# Patient Record
Sex: Female | Born: 1955
Health system: Southern US, Community
[De-identification: ages and names within clinical notes are randomized; demographics above are authoritative.]

## PROBLEM LIST (undated history)

## (undated) DIAGNOSIS — C50919 Malignant neoplasm of unspecified site of unspecified female breast: Secondary | ICD-10-CM

## (undated) DIAGNOSIS — Z9889 Other specified postprocedural states: Secondary | ICD-10-CM

## (undated) DIAGNOSIS — M25569 Pain in unspecified knee: Secondary | ICD-10-CM

## (undated) DIAGNOSIS — I219 Acute myocardial infarction, unspecified: Secondary | ICD-10-CM

## (undated) DIAGNOSIS — I2699 Other pulmonary embolism without acute cor pulmonale: Secondary | ICD-10-CM

## (undated) DIAGNOSIS — K219 Gastro-esophageal reflux disease without esophagitis: Secondary | ICD-10-CM

## (undated) DIAGNOSIS — N200 Calculus of kidney: Secondary | ICD-10-CM

## (undated) DIAGNOSIS — E119 Type 2 diabetes mellitus without complications: Secondary | ICD-10-CM

## (undated) DIAGNOSIS — I10 Essential (primary) hypertension: Secondary | ICD-10-CM

## (undated) DIAGNOSIS — I517 Cardiomegaly: Secondary | ICD-10-CM

## (undated) DIAGNOSIS — T8859XA Other complications of anesthesia, initial encounter: Secondary | ICD-10-CM

## (undated) DIAGNOSIS — I2510 Atherosclerotic heart disease of native coronary artery without angina pectoris: Secondary | ICD-10-CM

## (undated) DIAGNOSIS — T4145XA Adverse effect of unspecified anesthetic, initial encounter: Secondary | ICD-10-CM

## (undated) DIAGNOSIS — M7050 Other bursitis of knee, unspecified knee: Secondary | ICD-10-CM

## (undated) DIAGNOSIS — R112 Nausea with vomiting, unspecified: Secondary | ICD-10-CM

## (undated) DIAGNOSIS — R609 Edema, unspecified: Secondary | ICD-10-CM

## (undated) DIAGNOSIS — E785 Hyperlipidemia, unspecified: Secondary | ICD-10-CM

## (undated) HISTORY — DX: Acute myocardial infarction, unspecified: I21.9

## (undated) HISTORY — PX: CHOLECYSTECTOMY: SHX55

## (undated) HISTORY — PX: CARDIAC CATHETERIZATION: SHX172

## (undated) HISTORY — DX: Other bursitis of knee, unspecified knee: M70.50

## (undated) HISTORY — DX: Type 2 diabetes mellitus without complications: E11.9

## (undated) HISTORY — PX: TONSILECTOMY, ADENOIDECTOMY, BILATERAL MYRINGOTOMY AND TUBES: SHX2538

## (undated) HISTORY — DX: Malignant neoplasm of unspecified site of unspecified female breast: C50.919

## (undated) HISTORY — PX: TUBAL LIGATION: SHX77

## (undated) HISTORY — PX: LYMPHADENECTOMY: SHX15

## (undated) HISTORY — PX: TONSILLECTOMY: SUR1361

## (undated) HISTORY — DX: Gastro-esophageal reflux disease without esophagitis: K21.9

## (undated) HISTORY — DX: Cardiomegaly: I51.7

## (undated) HISTORY — DX: Pain in unspecified knee: M25.569

## (undated) HISTORY — DX: Atherosclerotic heart disease of native coronary artery without angina pectoris: I25.10

## (undated) HISTORY — DX: Other pulmonary embolism without acute cor pulmonale: I26.99

## (undated) HISTORY — PX: CYSTECTOMY: SUR359

## (undated) HISTORY — DX: Calculus of kidney: N20.0

## (undated) HISTORY — PX: OTHER SURGICAL HISTORY: SHX169

## (undated) HISTORY — DX: Essential (primary) hypertension: I10

## (undated) HISTORY — DX: Hyperlipidemia, unspecified: E78.5

## (undated) HISTORY — PX: BREAST LUMPECTOMY: SHX2

## (undated) HISTORY — PX: APPENDECTOMY: SHX54

## (undated) HISTORY — DX: Edema, unspecified: R60.9

---

## 2002-04-08 ENCOUNTER — Emergency Department (HOSPITAL_COMMUNITY): Admission: EM | Admit: 2002-04-08 | Discharge: 2002-04-08 | Payer: Self-pay | Admitting: *Deleted

## 2002-04-08 ENCOUNTER — Encounter: Payer: Self-pay | Admitting: Emergency Medicine

## 2002-09-23 ENCOUNTER — Encounter: Payer: Self-pay | Admitting: *Deleted

## 2002-09-23 ENCOUNTER — Encounter (HOSPITAL_COMMUNITY): Admission: RE | Admit: 2002-09-23 | Discharge: 2002-10-23 | Payer: Self-pay | Admitting: *Deleted

## 2003-05-10 DIAGNOSIS — I219 Acute myocardial infarction, unspecified: Secondary | ICD-10-CM

## 2003-05-10 HISTORY — DX: Acute myocardial infarction, unspecified: I21.9

## 2003-05-10 HISTORY — PX: CORONARY ANGIOPLASTY: SHX604

## 2004-05-09 DIAGNOSIS — I2510 Atherosclerotic heart disease of native coronary artery without angina pectoris: Secondary | ICD-10-CM

## 2004-05-09 HISTORY — DX: Atherosclerotic heart disease of native coronary artery without angina pectoris: I25.10

## 2004-09-13 ENCOUNTER — Ambulatory Visit: Payer: Self-pay | Admitting: *Deleted

## 2004-09-17 ENCOUNTER — Ambulatory Visit: Payer: Self-pay | Admitting: *Deleted

## 2004-09-17 ENCOUNTER — Encounter (HOSPITAL_COMMUNITY): Admission: RE | Admit: 2004-09-17 | Discharge: 2004-10-17 | Payer: Self-pay | Admitting: *Deleted

## 2004-09-24 ENCOUNTER — Ambulatory Visit (HOSPITAL_COMMUNITY): Admission: RE | Admit: 2004-09-24 | Discharge: 2004-09-24 | Payer: Self-pay | Admitting: *Deleted

## 2004-09-24 ENCOUNTER — Ambulatory Visit: Payer: Self-pay | Admitting: *Deleted

## 2004-10-01 ENCOUNTER — Inpatient Hospital Stay (HOSPITAL_BASED_OUTPATIENT_CLINIC_OR_DEPARTMENT_OTHER): Admission: RE | Admit: 2004-10-01 | Discharge: 2004-10-01 | Payer: Self-pay | Admitting: Cardiovascular Disease

## 2004-10-01 ENCOUNTER — Ambulatory Visit: Payer: Self-pay | Admitting: Cardiovascular Disease

## 2004-10-08 ENCOUNTER — Ambulatory Visit (HOSPITAL_COMMUNITY): Admission: RE | Admit: 2004-10-08 | Discharge: 2004-10-09 | Payer: Self-pay | Admitting: Cardiology

## 2004-10-22 ENCOUNTER — Ambulatory Visit: Payer: Self-pay | Admitting: *Deleted

## 2005-02-10 ENCOUNTER — Ambulatory Visit: Payer: Self-pay | Admitting: *Deleted

## 2005-03-17 ENCOUNTER — Ambulatory Visit: Payer: Self-pay | Admitting: Orthopedic Surgery

## 2005-05-08 ENCOUNTER — Emergency Department (HOSPITAL_COMMUNITY): Admission: EM | Admit: 2005-05-08 | Discharge: 2005-05-08 | Payer: Self-pay | Admitting: Emergency Medicine

## 2005-08-22 ENCOUNTER — Emergency Department (HOSPITAL_COMMUNITY): Admission: EM | Admit: 2005-08-22 | Discharge: 2005-08-22 | Payer: Self-pay | Admitting: Emergency Medicine

## 2005-09-01 ENCOUNTER — Ambulatory Visit: Payer: Self-pay | Admitting: Orthopedic Surgery

## 2005-09-16 ENCOUNTER — Ambulatory Visit: Payer: Self-pay | Admitting: *Deleted

## 2005-09-23 ENCOUNTER — Ambulatory Visit (HOSPITAL_COMMUNITY): Admission: RE | Admit: 2005-09-23 | Discharge: 2005-09-23 | Payer: Self-pay | Admitting: *Deleted

## 2005-09-23 ENCOUNTER — Ambulatory Visit: Payer: Self-pay | Admitting: *Deleted

## 2005-10-07 ENCOUNTER — Ambulatory Visit: Payer: Self-pay | Admitting: *Deleted

## 2005-10-10 ENCOUNTER — Ambulatory Visit: Payer: Self-pay | Admitting: Orthopedic Surgery

## 2005-11-29 ENCOUNTER — Ambulatory Visit (HOSPITAL_COMMUNITY): Admission: RE | Admit: 2005-11-29 | Discharge: 2005-11-29 | Payer: Self-pay | Admitting: Pulmonary Disease

## 2005-12-04 ENCOUNTER — Emergency Department (HOSPITAL_COMMUNITY): Admission: EM | Admit: 2005-12-04 | Discharge: 2005-12-04 | Payer: Self-pay | Admitting: Emergency Medicine

## 2005-12-09 ENCOUNTER — Encounter: Admission: RE | Admit: 2005-12-09 | Discharge: 2005-12-09 | Payer: Self-pay | Admitting: Pulmonary Disease

## 2005-12-09 ENCOUNTER — Encounter (INDEPENDENT_AMBULATORY_CARE_PROVIDER_SITE_OTHER): Payer: Self-pay | Admitting: Diagnostic Radiology

## 2005-12-09 ENCOUNTER — Encounter (INDEPENDENT_AMBULATORY_CARE_PROVIDER_SITE_OTHER): Payer: Self-pay | Admitting: *Deleted

## 2005-12-19 ENCOUNTER — Ambulatory Visit: Payer: Self-pay | Admitting: Cardiovascular Disease

## 2005-12-20 ENCOUNTER — Encounter: Admission: RE | Admit: 2005-12-20 | Discharge: 2005-12-20 | Payer: Self-pay | Admitting: Pulmonary Disease

## 2005-12-21 ENCOUNTER — Ambulatory Visit: Payer: Self-pay

## 2006-01-18 ENCOUNTER — Ambulatory Visit (HOSPITAL_BASED_OUTPATIENT_CLINIC_OR_DEPARTMENT_OTHER): Admission: RE | Admit: 2006-01-18 | Discharge: 2006-01-19 | Payer: Self-pay | Admitting: Surgery

## 2006-01-18 ENCOUNTER — Encounter (INDEPENDENT_AMBULATORY_CARE_PROVIDER_SITE_OTHER): Payer: Self-pay | Admitting: *Deleted

## 2006-02-13 ENCOUNTER — Ambulatory Visit (HOSPITAL_BASED_OUTPATIENT_CLINIC_OR_DEPARTMENT_OTHER): Admission: RE | Admit: 2006-02-13 | Discharge: 2006-02-13 | Payer: Self-pay | Admitting: Surgery

## 2006-02-20 ENCOUNTER — Ambulatory Visit (HOSPITAL_COMMUNITY): Payer: Self-pay | Admitting: Oncology

## 2006-02-20 ENCOUNTER — Encounter: Admission: RE | Admit: 2006-02-20 | Discharge: 2006-02-20 | Payer: Self-pay | Admitting: Oncology

## 2006-02-20 ENCOUNTER — Encounter (HOSPITAL_COMMUNITY): Admission: RE | Admit: 2006-02-20 | Discharge: 2006-03-22 | Payer: Self-pay | Admitting: Oncology

## 2006-02-27 ENCOUNTER — Ambulatory Visit (HOSPITAL_COMMUNITY): Admission: RE | Admit: 2006-02-27 | Discharge: 2006-02-27 | Payer: Self-pay | Admitting: Oncology

## 2006-03-01 ENCOUNTER — Ambulatory Visit (HOSPITAL_COMMUNITY): Admission: RE | Admit: 2006-03-01 | Discharge: 2006-03-02 | Payer: Self-pay | Admitting: Surgery

## 2006-03-01 ENCOUNTER — Encounter (INDEPENDENT_AMBULATORY_CARE_PROVIDER_SITE_OTHER): Payer: Self-pay | Admitting: Specialist

## 2006-03-09 ENCOUNTER — Inpatient Hospital Stay (HOSPITAL_COMMUNITY): Admission: RE | Admit: 2006-03-09 | Discharge: 2006-03-14 | Payer: Self-pay | Admitting: Pulmonary Disease

## 2006-03-10 ENCOUNTER — Ambulatory Visit: Payer: Self-pay | Admitting: Oncology

## 2006-03-28 ENCOUNTER — Encounter (HOSPITAL_COMMUNITY): Admission: RE | Admit: 2006-03-28 | Discharge: 2006-04-27 | Payer: Self-pay | Admitting: Oncology

## 2006-04-24 ENCOUNTER — Ambulatory Visit (HOSPITAL_COMMUNITY): Payer: Self-pay | Admitting: Oncology

## 2006-05-12 ENCOUNTER — Ambulatory Visit: Payer: Self-pay | Admitting: Cardiovascular Disease

## 2006-05-15 ENCOUNTER — Encounter (HOSPITAL_COMMUNITY): Admission: RE | Admit: 2006-05-15 | Discharge: 2006-06-14 | Payer: Self-pay | Admitting: Oncology

## 2006-06-16 ENCOUNTER — Ambulatory Visit: Admission: RE | Admit: 2006-06-16 | Discharge: 2006-08-30 | Payer: Self-pay | Admitting: *Deleted

## 2006-06-26 ENCOUNTER — Encounter (HOSPITAL_COMMUNITY): Admission: RE | Admit: 2006-06-26 | Discharge: 2006-07-26 | Payer: Self-pay | Admitting: Oncology

## 2006-06-26 ENCOUNTER — Ambulatory Visit (HOSPITAL_COMMUNITY): Payer: Self-pay | Admitting: Oncology

## 2006-07-21 ENCOUNTER — Ambulatory Visit (HOSPITAL_BASED_OUTPATIENT_CLINIC_OR_DEPARTMENT_OTHER): Admission: RE | Admit: 2006-07-21 | Discharge: 2006-07-21 | Payer: Self-pay | Admitting: Surgery

## 2006-10-15 ENCOUNTER — Emergency Department (HOSPITAL_COMMUNITY): Admission: EM | Admit: 2006-10-15 | Discharge: 2006-10-15 | Payer: Self-pay | Admitting: Emergency Medicine

## 2006-11-01 ENCOUNTER — Encounter (HOSPITAL_COMMUNITY): Admission: RE | Admit: 2006-11-01 | Discharge: 2006-12-01 | Payer: Self-pay | Admitting: Oncology

## 2006-11-01 ENCOUNTER — Ambulatory Visit (HOSPITAL_COMMUNITY): Payer: Self-pay | Admitting: Oncology

## 2006-12-12 ENCOUNTER — Encounter: Admission: RE | Admit: 2006-12-12 | Discharge: 2006-12-12 | Payer: Self-pay | Admitting: Surgery

## 2007-04-23 ENCOUNTER — Ambulatory Visit: Payer: Self-pay | Admitting: Oncology

## 2007-04-24 ENCOUNTER — Ambulatory Visit (HOSPITAL_COMMUNITY): Payer: Self-pay | Admitting: Oncology
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
## 2007-09-18 ENCOUNTER — Ambulatory Visit: Payer: Self-pay | Admitting: Cardiovascular Disease

## 2007-10-29 ENCOUNTER — Ambulatory Visit (HOSPITAL_COMMUNITY): Payer: Self-pay | Admitting: Oncology

## 2007-12-13 ENCOUNTER — Ambulatory Visit: Payer: Self-pay

## 2007-12-13 ENCOUNTER — Encounter: Payer: Self-pay | Admitting: Cardiology

## 2007-12-13 ENCOUNTER — Encounter: Payer: Self-pay | Admitting: Cardiovascular Disease

## 2007-12-14 ENCOUNTER — Ambulatory Visit: Payer: Self-pay

## 2007-12-17 ENCOUNTER — Encounter: Admission: RE | Admit: 2007-12-17 | Discharge: 2007-12-17 | Payer: Self-pay | Admitting: Pulmonary Disease

## 2008-02-11 ENCOUNTER — Ambulatory Visit: Payer: Self-pay | Admitting: Orthopedic Surgery

## 2008-02-11 DIAGNOSIS — M25569 Pain in unspecified knee: Secondary | ICD-10-CM

## 2008-02-11 DIAGNOSIS — IMO0002 Reserved for concepts with insufficient information to code with codable children: Secondary | ICD-10-CM

## 2008-04-09 ENCOUNTER — Ambulatory Visit: Payer: Self-pay | Admitting: Gastroenterology

## 2008-05-05 ENCOUNTER — Ambulatory Visit (HOSPITAL_COMMUNITY): Payer: Self-pay | Admitting: Oncology

## 2008-06-02 ENCOUNTER — Encounter: Payer: Self-pay | Admitting: Gastroenterology

## 2008-06-02 ENCOUNTER — Ambulatory Visit (HOSPITAL_COMMUNITY): Admission: RE | Admit: 2008-06-02 | Discharge: 2008-06-02 | Payer: Self-pay | Admitting: Gastroenterology

## 2008-06-02 ENCOUNTER — Ambulatory Visit: Payer: Self-pay | Admitting: Gastroenterology

## 2008-06-25 ENCOUNTER — Encounter: Admission: RE | Admit: 2008-06-25 | Discharge: 2008-06-25 | Payer: Self-pay | Admitting: Pulmonary Disease

## 2008-10-27 ENCOUNTER — Ambulatory Visit (HOSPITAL_COMMUNITY): Payer: Self-pay | Admitting: Oncology

## 2008-12-02 ENCOUNTER — Ambulatory Visit: Payer: Self-pay | Admitting: Orthopedic Surgery

## 2008-12-26 ENCOUNTER — Encounter: Admission: RE | Admit: 2008-12-26 | Discharge: 2008-12-26 | Payer: Self-pay | Admitting: Pulmonary Disease

## 2009-03-13 ENCOUNTER — Telehealth (INDEPENDENT_AMBULATORY_CARE_PROVIDER_SITE_OTHER): Payer: Self-pay | Admitting: *Deleted

## 2009-03-26 ENCOUNTER — Telehealth: Payer: Self-pay | Admitting: Cardiovascular Disease

## 2009-06-02 ENCOUNTER — Ambulatory Visit (HOSPITAL_COMMUNITY): Payer: Self-pay | Admitting: Oncology

## 2009-06-12 ENCOUNTER — Ambulatory Visit (HOSPITAL_COMMUNITY)
Admission: RE | Admit: 2009-06-12 | Discharge: 2009-06-12 | Payer: Self-pay | Source: Home / Self Care | Admitting: Oncology

## 2009-06-25 ENCOUNTER — Telehealth: Payer: Self-pay | Admitting: Cardiovascular Disease

## 2009-11-16 ENCOUNTER — Telehealth: Payer: Self-pay | Admitting: Cardiovascular Disease

## 2009-11-27 ENCOUNTER — Ambulatory Visit (HOSPITAL_COMMUNITY): Payer: Self-pay | Admitting: Oncology

## 2009-12-18 DIAGNOSIS — I2510 Atherosclerotic heart disease of native coronary artery without angina pectoris: Secondary | ICD-10-CM

## 2009-12-18 DIAGNOSIS — E785 Hyperlipidemia, unspecified: Secondary | ICD-10-CM

## 2009-12-18 DIAGNOSIS — R079 Chest pain, unspecified: Secondary | ICD-10-CM

## 2009-12-18 DIAGNOSIS — K219 Gastro-esophageal reflux disease without esophagitis: Secondary | ICD-10-CM | POA: Insufficient documentation

## 2009-12-18 DIAGNOSIS — Z8679 Personal history of other diseases of the circulatory system: Secondary | ICD-10-CM

## 2009-12-18 DIAGNOSIS — R609 Edema, unspecified: Secondary | ICD-10-CM

## 2009-12-18 DIAGNOSIS — C50919 Malignant neoplasm of unspecified site of unspecified female breast: Secondary | ICD-10-CM | POA: Insufficient documentation

## 2009-12-28 ENCOUNTER — Ambulatory Visit: Payer: Self-pay | Admitting: Cardiovascular Disease

## 2009-12-30 ENCOUNTER — Encounter: Admission: RE | Admit: 2009-12-30 | Discharge: 2009-12-30 | Payer: Self-pay | Admitting: Surgery

## 2010-05-28 ENCOUNTER — Ambulatory Visit (HOSPITAL_COMMUNITY)
Admission: RE | Admit: 2010-05-28 | Discharge: 2010-06-08 | Payer: Self-pay | Source: Home / Self Care | Attending: Oncology | Admitting: Oncology

## 2010-05-28 ENCOUNTER — Encounter (HOSPITAL_COMMUNITY)
Admission: RE | Admit: 2010-05-28 | Discharge: 2010-06-08 | Payer: Self-pay | Source: Home / Self Care | Attending: Oncology | Admitting: Oncology

## 2010-05-30 ENCOUNTER — Encounter: Payer: Self-pay | Admitting: *Deleted

## 2010-06-10 NOTE — Progress Notes (Signed)
Summary: refill request   Phone Note Refill Request   Refills Requested: Medication #1:  PLAVIX 75 MG TABS Take one tablet by mouth daily. pt wants to know if med comes in generic? pls fax to [facility]   Method Requested: Fax to Local Pharmacy Initial call taken by: Glynda Jaeger,  November 16, 2009 9:59 AM  Follow-up for Phone Call        sent to [facility]... called pt back but she did not answer.... only in [place]...  Follow-up by: Kem Parkinson,  November 16, 2009 10:57 AM    Prescriptions: PLAVIX 75 MG TABS (CLOPIDOGREL BISULFATE) Take one tablet by mouth daily  #90 x 3   Entered by:   Kem Parkinson   Authorized by:   Colon Branch, MD, [facility]   Signed by:   Kem Parkinson on 11/16/2009   Method used:   Electronically to        [facility] (retail)             ,          Ph: [phone]       Fax: [phone]   RxID:   [id number]

## 2010-06-10 NOTE — Assessment & Plan Note (Signed)
Summary: 2 year follow up/mt  Medications Added METOPROLOL SUCCINATE 25 MG XR24H-TAB (METOPROLOL SUCCINATE) Take one tablet by mouth daily AROMASIN 25 MG TABS (EXEMESTANE) 1 tab qd ASPIRIN 81 MG TBEC (ASPIRIN) Take one tablet by mouth daily MULTIVITAMINS   TABS (MULTIPLE VITAMIN) 1 tab once daily ZETIA 10 MG TABS (EZETIMIBE) Take one tablet by mouth daily. ANTARA 130 MG CAPS (FENOFIBRATE MICRONIZED) 1 tab once daily CRESTOR 20 MG TABS (ROSUVASTATIN CALCIUM) Take one tablet by mouth daily. * TUMS daily * STOOL SOFTNER dailly TYLENOL 325 MG TABS (ACETAMINOPHEN) as needed NITROSTAT 0.4 MG SUBL (NITROGLYCERIN) 1 tablet under tongue at onset of chest pain; you may repeat every 5 minutes for up to 3 doses.      Allergies Added:   Visit Type:  Follow-up/ 2year  Primary Provider:  Dr Juanetta Gosling  CC:  no complaints pt just thought it was time for a rov.  History of Present Illness: [name] [name] returns today for follow-up.  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  She has been doing well.  Since I last saw her she has had follow-up breast cancer treatment.  She had a lumpectomy and adjuvant therapy including chemo.  She has had previous cardiomegaly. She is a previous smoker with hyperlipidemia and coronary disease.  She had a stent to the LAD in May 2006.  It was a drug-eluting stent.  Since that time she has not had any significant chest pain.   She has lost about 40lb with weight watchers and I congratulated her on this.  Her GERD is gone and she has better exercise toleracne.  Her previous angina was throat and neck tightness.  She has not had any but needs a refill on nitro.  Since her stent was over 5 years ago and her Plavix is expensive I told her she could stop it and continue ASA .  There has been no cough, pleuritic pain or wheezing.  She does have seasonal allergies.   Current Problems (verified): 1)  Cad  (ICD-414.00) 2)  Chest Pain  (ICD-786.50) 3)  Hypertension, Hx of  (ICD-V12.50) 4)   Hyperlipidemia  (ICD-272.4) 5)  Gerd  (ICD-530.81) 6)  Breast Cancer  (ICD-174.9) 7)  Edema  (ICD-782.3) 8)  Anserine Bursitis  (ICD-726.61) 9)  Knee Pain  (ICD-719.46)  Current Medications (verified): 1)  Metoprolol Succinate 25 Mg Xr24h-Tab (Metoprolol Succinate) .... Take One Tablet By Mouth Daily 2)  Aromasin 25 Mg Tabs (Exemestane) .Marland Kitchen.. 1 Tab Qd 3)  Aspirin 81 Mg Tbec (Aspirin) .... Take One Tablet By Mouth Daily 4)  Multivitamins   Tabs (Multiple Vitamin) .Marland Kitchen.. 1 Tab Once Daily 5)  Zetia 10 Mg Tabs (Ezetimibe) .... Take One Tablet By Mouth Daily. 6)  Antara 130 Mg Caps (Fenofibrate Micronized) .Marland Kitchen.. 1 Tab Once Daily 7)  Crestor 20 Mg Tabs (Rosuvastatin Calcium) .... Take One Tablet By Mouth Daily. 8)  Tums .... Daily 9)  Stool Softner .... Dailly 10)  Tylenol 325 Mg Tabs (Acetaminophen) .... As Needed 11)  Nitrostat 0.4 Mg Subl (Nitroglycerin) .Marland Kitchen.. 1 Tablet Under Tongue At Onset of Chest Pain; You May Repeat Every 5 Minutes For Up To 3 Doses.  Allergies (verified): 1)  ! Codeine 2)  ! Pentazocine-Apap (Pentazocine-Apap) 3)  ! Cefadroxil (Cefadroxil) 4)  ! Talwin (Pentazocine Lactate)  Past History:  Past Medical History: Last updated: 12/18/2009 CAD: drug eluting stent to LAD in 2006 CHEST PAIN  HYPERTENSION, HX OF HYPERLIPIDEMIA GERD  BREAST CANCER  EDEMA ANSERINE BURSITIS  KNEE PAIN   Adjuvant therapy with chemotherapy.  History of cardiomegaly and  anterior wall myocardial infarction  Acute pulmonary emboli   History of kidney stones.  Past Surgical History: Last updated: 12/18/2009  Colonoscopy with cold snare and snare cautery polypectomy.   cardiac Stent,  cholecystectomy   lymphadenectomy   Family History: Last updated: 12/18/2009  Positive for coronary disease in several family members.  Her father had thrombophlebitis, but it is not clear if he had pulmonary  emboli.  Social History: Last updated: 12/18/2009  She does not smoke.  She does  not drink any alcohol.  She  does not use any other drugs.  Review of Systems       Denies fever, malais, weight loss, blurry vision, decreased visual acuity, cough, sputum, SOB, hemoptysis, pleuritic pain, palpitaitons, heartburn, abdominal pain, melena, lower extremity edema, claudication, or rash.   Vital Signs:  Patient profile:   55 year old female Height:      65 inches Weight:      234 pounds BMI:     39.08 Pulse rate:   73 / minute BP sitting:   130 / 74  (left arm) Cuff size:   large  Vitals Entered By: Burnett Kanaris, CNA (December 28, 2009 4:40 PM)  Physical Exam  General:  Affect appropriate Healthy:  appears stated age HEENT: normal Neck supple with no adenopathy JVP normal no bruits no thyromegaly Lungs clear with no wheezing and good diaphragmatic motion Heart:  S1/S2 no murmur,rub, gallop or click PMI normal Abdomen: benighn, BS positve, no tenderness, no AAA no bruit.  No HSM or HJR Distal pulses intact with no bruits No edema Neuro non-focal Skin warm and dry    Impression & Recommendations:  Problem # 1:  CAD (ICD-414.00) Stable no angina.  Refill nitro called in . Continue CRF modification.  D/C Plavix as stent is over 11 years old The following medications were removed from the medication list:    Plavix 75 Mg Tabs (Clopidogrel bisulfate) .Marland Kitchen... Take one tablet by mouth daily Her updated medication list for this problem includes:    Metoprolol Succinate 25 Mg Xr24h-tab (Metoprolol succinate) .Marland Kitchen... Take one tablet by mouth daily    Aspirin 81 Mg Tbec (Aspirin) .Marland Kitchen... Take one tablet by mouth daily    Nitrostat 0.4 Mg Subl (Nitroglycerin) .Marland Kitchen... 1 tablet under tongue at onset of chest pain; you may repeat every 5 minutes for up to 3 doses.  Orders: EKG w/ Interpretation (93000)  Problem # 2:  HYPERTENSION, HX OF (ICD-V12.50) Well controlled continue low sodium diet Orders: EKG w/ Interpretation (93000)  Problem # 3:  HYPERLIPIDEMIA  (ICD-272.4) Indicates LDL is good and followed by Dr Juanetta Gosling.   Her updated medication list for this problem includes:    Zetia 10 Mg Tabs (Ezetimibe) .Marland Kitchen... Take one tablet by mouth daily.    Antara 130 Mg Caps (Fenofibrate micronized) .Marland Kitchen... 1 tab once daily    Crestor 20 Mg Tabs (Rosuvastatin calcium) .Marland Kitchen... Take one tablet by mouth daily.  Problem # 4:  GERD (ICD-530.81) Resolved with weight loss.  Avoid carbs  Patient Instructions: 1)  Your physician recommends that you schedule a follow-up appointment in: 1 year with Dr. Eden Emms 2)  Your physician has recommended you make the following change in your medication: STOP Plavix Prescriptions: NITROSTAT 0.4 MG SUBL (NITROGLYCERIN) 1 tablet under tongue at onset of chest pain; you may repeat every 5 minutes for up to 3 doses.  #25 x 11  Entered by:   Lisabeth Devoid RN   Authorized by:   Colon Branch, MD, [facility]   Signed by:   Lisabeth Devoid RN on 12/28/2009   Method used:   Electronically to        [facility] (retail)       [address]       [place], [place]  [address]       Ph: [phone]       Fax: [phone]   RxID:   [id number]    EKG Report  Procedure date:  12/28/2009  Findings:      NSR 74 Normal ECG

## 2010-06-10 NOTE — Progress Notes (Signed)
Summary: refill meds  Medications Added PLAVIX 75 MG TABS (CLOPIDOGREL BISULFATE) Take one tablet by mouth daily       Phone Note Refill Request Call back at Home Phone [phone] Call back at [phone] Message from:  Patient on June 25, 2009 8:18 AM  plavix 75 mg . pls send to [facility]    Method Requested: Fax to [facility] Initial call taken by: Lorne Skeens,  June 25, 2009 8:18 AM  Follow-up for Phone Call        pt needs to make an appt Follow-up by: Kem Parkinson,  June 25, 2009 8:27 AM    New/Updated Medications: PLAVIX 75 MG TABS (CLOPIDOGREL BISULFATE) Take one tablet by mouth daily Prescriptions: PLAVIX 75 MG TABS (CLOPIDOGREL BISULFATE) Take one tablet by mouth daily  #90 x 1   Entered by:   Kem Parkinson   Authorized by:   Colon Branch, MD, [facility]   Signed by:   Kem Parkinson on 06/25/2009   Method used:   Electronically to        [facility] (mail-order)             ,          Ph: [phone]       Fax: [phone]   RxID:   [id number]

## 2010-08-04 ENCOUNTER — Other Ambulatory Visit: Payer: Self-pay | Admitting: Cardiovascular Disease

## 2010-08-05 NOTE — Telephone Encounter (Signed)
[address] °

## 2010-09-21 NOTE — Consult Note (Signed)
[name] [name], [name] [name]                   ACCOUNT NO.:  [id number]   MEDICAL RECORD NO.:  [id number]         PATIENT TYPE:  PAMB   LOCATION:  DAY                           FACILITY:  [facility]   PHYSICIAN:  Kassie Mends, M.D.      DATE OF BIRTH:  [birth year]   DATE OF CONSULTATION:  DATE OF DISCHARGE:                                 CONSULTATION   REFERRING PHYSICIAN:  Edward L. Juanetta Gosling, MD   REASON FOR CONSULTATION:  High-risk screening colonoscopy.   HISTORY OF PRESENT ILLNESS:  Ms. Lias is a 55 year old female who has  history of coronary artery disease.  Her last intervention was in May  2006.  She had a drug-eluting stent placed.  She was last seen by our  cardiologist in May 2009.  She had a stress test in August of this year,  which showed previous anterior wall myocardial infarction.  She was  diagnosed with breast cancer and has had the need to have her Plavix and  aspirin stopped for procedure and has not had any difficulties.  She  reports that her father had colon cancer at age greater than 30.  She  has no other first-degree relative with colon cancer or colon polyps.  She denies any blood in her stool, abdominal pain, problems swallowing,  nausea, or vomiting.  She has never taken nitroglycerin sublingual.  She  denies any weight loss.  She did not have paroxysmal nocturnal dyspnea  or chest pain.  If she eats too late she gets heartburn.  She usually  has 2 bowel movements a day.  Sometime she has hard bowel movements and  she takes stool softeners every other day to prevent this.  She denies  any history of heart or kidney failure.   PAST MEDICAL HISTORY:  1. Gastroesophageal reflux disease.  2. Coronary artery disease.  3. Breast cancer diagnosed at age 9.   PAST SURGICAL HISTORY:  1. Appendectomy.  2. Left lumpectomy.  3. Foley catheter placed and removed.  4. Cholecystectomy in 1994 due to history of stones.  5. Tonsillectomy.   ALLERGIES:  CODEINE and its forms caused  rash and vomiting.  She is also  allergic to DURICEF.   MEDICATIONS:  1. Plavix 75 mg daily.  2. Aspirin 81 mg daily.  3. Lipofen 150 mg daily.  4. Crestor 20 mg daily.  5. Zetia 10 mg daily.  6. Aromasin 25 mg daily.  7. Nexium 40 mg daily.  8. Metoprolol 25 mg b.i.d.  9. Lasix 40 mg every other day.  10.Potassium 20 mEq every other day.   FAMILY HISTORY:  Her aunt and cousin had breast cancer.  She has no  family history of uterine or ovarian cancer.   SOCIAL HISTORY:  She is divorced.  She works as an Recruitment consultant for [employer].  She quit smoking two and a half years  ago.  She denies any alcohol use.   REVIEW OF SYSTEMS:  As per the HPI.  Otherwise, all systems are  negative.   PHYSICAL EXAMINATION:  VITAL SIGNS:  Weight 285-1/2 pounds, height 5  feet 5 inches, BMI 47.4 (morbidly obese), temperature 98.6, blood  pressure  138/82, and pulse 80.  GENERAL:  She is in no apparent distress.  Alert and oriented x4.HEENT:  Atraumatic and normocephalic.  Pupils equal and reactive to light.  Mouth, no oral lesions.  Posterior pharynx without erythema or  exudate.LUNGS:  Clear to auscultation bilaterally.CARDIOVASCULAR:  Regular rate and rhythm.  No murmur.  Normal S1 and S2.  ABDOMEN:  Bowel sounds are present.  Soft, nontender, and nondistended.  No rebound or guarding.EXTREMITIES:  No cyanosis or edema.NEURO:  She  has no focal neurological deficit.   LABORATORY DATA:  From October 2009, white count 4.5, hemoglobin 13.7,  and platelets 254, BUN 25, creatinine 0.63, AST 25, ALT 34, total bili  0.4, albumin 4.2.   ASSESSMENT:  Ms. Symmonds is a 55 year old female who is considered high-risk  colon cancer screening due to her chronic use of aspirin and Plavix due  to coronary artery disease, stent placement, and prior myocardial  infarction.  She currently is not having any symptoms related to her  coronary artery disease.  She has previously had her aspirin and  Plavix  without stent thrombosis. Thank you for allowing me to see Ms. Vancuren in  consultation.  My recommendations follow.   RECOMMENDATIONS:  1. She has requested a pill prep.  I explained to her that OsmoPrep      may be associated with worsening heart failure or kidney failure.      She is asked to hold the Lasix on the day before and the day of her      procedure to prevent volume depletion.  She is instructed to keep      her urine light yellow or clear.  2. Will stop the aspirin and Plavix 5 days before her procedure.  She      is requested it be performed in January 2010.  3. She will follow up with me as needed.      Kassie Mends, M.D.  Electronically Signed     SM/MEDQ  D:  04/09/2008  T:  04/10/2008  Job:  [id number]   cc:   Ramon Dredge L. Juanetta Gosling, M.D.  Fax: [phone]

## 2010-09-21 NOTE — Op Note (Signed)
NAME:  [name], [name]                   ACCOUNT NO.:  [id number]   MEDICAL RECORD NO.:  [id number]          PATIENT TYPE:  AMB   LOCATION:  DAY                           FACILITY:  [facility]   PHYSICIAN:  Kassie Mends, M.D.      DATE OF BIRTH:  February 04, 1956   DATE OF PROCEDURE:  DATE OF DISCHARGE:                               OPERATIVE REPORT   REFERRING Adlee Paar:  Oneal Deputy. Juanetta Gosling, MD   PROCEDURE:  Colonoscopy with cold snare and snare cautery polypectomy.   INDICATION FOR EXAM:  [name] [name] is a 55 year old female whose father had  colon cancer at age greater than 62.  She presents for colon cancer  screening.  She is chronically maintained on aspirin and Plavix for  coronary artery disease.   FINDINGS:  1. Tortuous colon which required her to be in the prone position to      successfully intubate the cecum.  An 8-mm flat sessile cecal polyp      removed via snare cautery.  Two 8-mm sessile hepatic flexure polyps      removed via snare cautery.  One 6-mm sessile transverse colon polyp      removed via snare cautery.  One 4-mm sessile rectal polyp removed      via cold forceps.  The base of the polyps in the cecum, transverse      colon, and the rectum needed to have cautery applied in order to      achieve hemostasis.  2. Rare sigmoid colon diverticula.  Otherwise, no masses, inflammatory      changes, or AVMs.  3. Small internal hemorrhoids.   DIAGNOSES:  1. Multiple colon polyps.  2. Screening colonoscopy in 5 years.  She should follow a high-fiber      diet.  She was given a handout on high-fiber diet, polyps,      diverticulosis, and hemorrhoids.  3. No aspirin or Plavix until June 10, 2008.  No anticoagulation      for 7 days.   MEDICATIONS:  1. Demerol 100 mg IV.  2. Versed 5 mg IV.   PROCEDURE TECHNIQUE:  Physical exam was performed.  Informed consent was  obtained from the patient explaining the benefits, risks, and  alternatives to the procedure.  The patient was  connected to the  monitor, placed in the left lateral position.  Continuous oxygen was  provided by nasal cannula.  IV medicine administered through an  indwelling cannula.  After administration of sedation and rectal exam,  the patient's rectum was intubated and the scope was advanced under  direct visualization to the cecum.  The scope was removed slowly by  carefully examining  the color, texture, anatomy, and integrity of the mucosa on the way out.  The patient was recovered in endoscopy and discharged home in  satisfactory condition.   PATH:  Tubular and hyperplastic adenomas.      Kassie Mends, M.D.  Electronically Signed     SM/MEDQ  D:  06/02/2008  T:  06/02/2008  Job:  [id number]   cc:   Ramon Dredge  Patrice Paradise, M.D.  Fax: [phone]

## 2010-09-21 NOTE — Assessment & Plan Note (Signed)
[facility]                            CARDIOLOGY OFFICE NOTE   NAME:Demchak, TANAI BOULER                          MRN:          [id number]  DATE:09/18/2007                            DOB:          [birth year]    [name] [name] returns today for follow-up.  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  She has been doing well.  Since I last saw her she has had  follow-up breast cancer treatment.  She had a lumpectomy and adjuvant  therapy including chemo.  She has had previous cardiomegaly.   She is a previous smoker with hyperlipidemia and coronary disease.   She had a stent to the LAD in May 2006.   It was a drug-eluting stent.  Since that time she has not had any  significant chest pain.  She has mild chronic exertional dyspnea.  This  seems functional probably secondary to her weight.  There has been no  cough, pleuritic pain or wheezing.  She does have seasonal allergies.   REVIEW OF SYSTEMS:  Otherwise, negative.   CURRENT MEDICATIONS:  1. Plavix 75 a day.  2. Aspirin a day.  3. Antara 130 a day.  4. Crestor 20 a day.  5. Zetia 10 a day.  6. Aromasin daily.  7. Metoprolol 25 b.i.d.  8. Nexium 40 a day.  9. Lasix 40 every other day.  10.Potassium 20 a day.   PHYSICAL EXAMINATION:  GENERAL:  Remarkable for a healthy-appearing but  overweight white female in no distress.  VITAL SIGNS:  Weight 278, blood pressure 150/80, pulse 79 and regular,  afebrile.  HEENT:  Unremarkable.  NECK:  Carotids normal without bruit, no lymphadenopathy, thyromegaly,  JVP elevation.  LUNGS:  Clear, good diaphragmatic motion.  No wheezing.  HEART:  S1, S2 with normal heart sounds, PMI not palpable.  ABDOMEN:  Protuberant.  Bowel sounds positive.  No AAA, no bruit, no  hepatosplenomegaly, no jugular reflux.  No tenderness.  EXTREMITIES:  Distal pulses are intact with trace edema.  NEUROLOGICAL:  Nonfocal.  SKIN:  Warm and dry.  MUSCULOSKELETAL:  No muscular weakness.  She is  status post lumpectomy.   IMPRESSION:  1. Coronary disease, previous anterior wall myocardial infarction,      follow-up stress Myoview August of this year.  Continue aspirin,      Plavix, and beta blocker.  2. Hypertension currently well-controlled.  Continue current dose of      diuretic and beta blocker.  3. Lower extremity edema improved.  Continue low salt diet.  Try to      lose weight.  Continue current dose of Lasix and potassium.  4. History of breast cancer.  Follow with Dr. Mariel Sleet.  Hopefully      her markers will remain negative, and she is due to have repeat      test and possibly a PET scan.  5. Adjuvant therapy with chemotherapy.  History of cardiomegaly and      anterior wall myocardial infarction.  I would like to find out when      her  last echocardiogram was.  She seems to think it was within the      last year.  We will check 2-D echocardiogram.  If this is not done      I would like to get a baseline in regards to her valve function,      heart size, and left ventricle function particularly after      chemotherapy.  6. Hyperlipidemia.  Continue Zetia and Crestor.  Lipid and liver      profile in six months.  7. Tobacco abuse.  Quit for about five months.  Chantix of help.      Hopefully she will not go back to this.  I congratulated her on her      not smoking.   I have not seen Nyeemah in about two years.  Her EKG today was normal.  Hopefully her heart will continue to do well.     [name]. Eden Emms, MD, [facility]  Electronically Signed    PCN/MedQ  DD: 09/18/2007  DT: 09/18/2007  Job #: [id number]

## 2010-09-24 NOTE — Group Therapy Note (Signed)
NAME:  [name] [name], [name] [name]                   ACCOUNT NO.:  [id number]   MEDICAL RECORD NO.:  [id number]          PATIENT TYPE:  INP   LOCATION:  A220                          FACILITY:  [facility]   PHYSICIAN:  Edward L. Juanetta Gosling, M.D.DATE OF BIRTH:  Mar 15, 1956   DATE OF PROCEDURE:  DATE OF DISCHARGE:                                   PROGRESS NOTE   [name] [name] is admitted with pulmonary emboli.  She has coronary artery  occlusive disease.  She has breast cancer.  She says she is much better  today.  Pain medications are working better.  She is not nauseated. She  still had a lot of drainage out of her nostrils but overall feels better and  is doing better.  She has no new complaints.  Her staples were removed  yesterday but she is still draining too much from our [place] to  remove that.  Exam shows her chest is clear.  Heart is regular.  She looks  much more comfortable.  Her abdomen is soft.  Extremities showed no edema.   ASSESSMENT:  She is better.   PLAN:  Is to continue her current treatment and follow.  Depending on her  situation with anticoagulation, she may be able to go home fairly soon.      Edward L. Juanetta Gosling, M.D.  Electronically Signed     ELH/MEDQ  D:  03/12/2006  T:  03/13/2006  Job:  [id number]

## 2010-09-24 NOTE — Assessment & Plan Note (Signed)
[facility]                              CARDIOLOGY OFFICE NOTE   NAME:[name] [name]                          MRN:          [id number]  DATE:12/19/2005                            DOB:          1955-08-10    Mrs. [name] [name] is a delightful patient I remember cathing in 2006.  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  She primarily sees Dr. Dorethea Clan up in [place], [place].  Her primary  care doctor is Dr. Juanetta Gosling.  Patient had somewhat atypical chest pain in May  of 2006.   She had an abnormal Myoview with left ventricular cavity dilatation and  question of an anterior wall defect.  She had a subtotally occluded LAD  which was intervened upon with a drug eluting stent.  Since that time she  has not had recurrent symptoms.  She quit smoking with the aid of Chantix  about five months ago.   She had mammogram which showed left breast carcinoma.  She still needs her  staging but will most likely need a mastectomy or lumpectomy by Dr. Jamey Ripa  in the near future.  She was sent to our office for preoperative clearance.   She has been on aspirin and Plavix.   She does not have any bleeding diaphysis.  She is not on a beta-blocker.  Her risk factors are fairly well modified and she is on Crestor.  She walks  on a regular basis and is not having any angina.   Overall in terms of her risk stratification, she is undergoing relatively  low risk surgery in regards to her heart.  She quit smoking five months ago  and has not had recurrent symptoms.   I think it is reasonable to proceed with a stress Myoview to make sure that  there is no LV cavity dilatation or ischemia.   She will have to stop her Plavix some time before the surgery and we will  try to minimize his time to 4-7 days.  I will talk to Dr. Jamey Ripa personally  about this.   She is not on the beta-blocker and we will start her on the Toprol 25 mg a  day for a week to be increased to 50 mg.  I explained to her that this is  the only medicine that has been shown to decrease perioperative risks.   PHYSICAL EXAMINATION:  VITAL SIGNS:  Pulse is 80, blood pressure 134/80.  NECK:  Carotids are normal.  LUNGS:  Clear.  CARDIOVASCULAR:  There is an S1 and S2 with normal heart sounds.  ABDOMEN:  Benign.  EXTREMITIES:  Intact pulses, no edema.   MEDICATIONS:  1. Crestor 20 mg a day.  2. __________ 130 a day.  3. AcipHex 20 a day.  4. Aspirin once a day.  5. Plavix 75 a day.  6. Lopressor 12.5 b.i.d.   IMPRESSION:  Patient will have her beta-blocker increased.  She will have  her stress Myoview.  If this is nonischemic, we will clear her for surgery.  Her Plavix will be stopped for a minimum amount of time.   Overall, I think that her operative risk is relatively low.   Clearly, she needs her breast cancer worked up and cured.                               [name]. Eden Emms, MD, [facility]    PCN/MedQ  DD:  12/19/2005  DT:  12/19/2005  Job #:  [id number]   cc:   Currie Paris, MD

## 2010-09-24 NOTE — Procedures (Signed)
[name] [name], [name] [name]                   ACCOUNT NO.:  [id number]   MEDICAL RECORD NO.:  [id number]          PATIENT TYPE:  OUT   LOCATION:  RAD                           FACILITY:  [facility]   PHYSICIAN:  Vida Roller, M.D.   DATE OF BIRTH:  13-Nov-1955   DATE OF PROCEDURE:  09/23/2005  DATE OF DISCHARGE:                                  ECHOCARDIOGRAM   PRIMARY CARE PHYSICIAN:  Oneal Deputy. Juanetta Gosling, M.D.   Hansel Starling:  [id number].   TAPE COUNT:  2935 through 3357.   A 55 year old woman with congestive heart failure.  The technical quality of  the study is difficult.   M-MODE TRACINGS:  The aorta is 28 mm.   Left atrium is 36 mm.   The septum is 15 mm.   The posterior wall is 15 mm.   Left ventricular diastolic dimension is 42 mm.   Left ventricular systolic dimension is 28 mm.   2D AND DOPPLER IMAGING:  The left ventricle is normal size.  There is  preserved LV systolic function.  Estimated ejection fraction is 60-65%.  There are no wall motion abnormalities seen.  There is moderate concentric  left ventricular hypertrophy.   The right ventricle is mildly dilated.  There is mildly depressed RV  systolic function.   The right atrium is dilated.  The left atrium is normal size.   The aortic valve is sclerotic with no stenosis or regurgitation.   The mitral valve has mild annular calcification with trivial regurgitation.   The tricuspid valve with mild regurgitation.   There is no pericardial effusion.      Vida Roller, M.D.  Electronically Signed     JH/MEDQ  D:  09/23/2005  T:  09/23/2005  Job:  [id number]

## 2010-09-24 NOTE — Group Therapy Note (Signed)
NAME:  [name] [name], [name] [name]                   ACCOUNT NO.:  [id number]   MEDICAL RECORD NO.:  [id number]          PATIENT TYPE:  INP   LOCATION:  A220                          FAC[facility]:  [facility]   PHYSICIAN:  Edward L. Juanetta Gosling, M.D.DATE OF BIRTH:  Dec 01, 1955   DATE OF PROCEDURE:  DATE OF DISCHARGE:                                   PROGRESS NOTE   [name] says she is much better.  She is having less chest pain.  She  actually tried Darvocet for her pain but it made her very sick and she said  confused so she does not want to take any more of that.  Otherwise she is  better.  She is able to breathe better.  She has been up and moving around.  Her drain in the side of her breast incision has drained only 20 mL so she  is at the limit that Dr. Jamey Ripa had advised that she could have her drain  removed so I have ordered for that to be removed today.  Her INR is 1.6 so  she is not therapeutic yet so she cannot go home yet.  Exam shows that she  is awake, alert.  Chest is much clearer.  She looks very comfortable.  She  has very little drainage from the Jackson-Pratt.   PLAN:  Continue her meds, continue her treatments, and discuss with her  cardiologist about whether she should be on Plavix or not.  The Plavix of  course is related to her stent.      Edward L. Juanetta Gosling, M.D.  Electronically Signed     ELH/MEDQ  D:  03/13/2006  T:  03/14/2006  Job:  [id number]

## 2010-09-24 NOTE — Group Therapy Note (Signed)
NAME:  [name] [name], [name] [name]                   ACCOUNT NO.:  [id number]   MEDICAL RECORD NO.:  [id number]          PATIENT TYPE:  INP   LOCATION:  A220                          [facility]   PHYSICIAN:  Edward L. Juanetta Gosling, M.D.DATE OF BIRTH:  [birth year]   DATE OF PROCEDURE:  DATE OF DISCHARGE:                                   PROGRESS NOTE   PROBLEM:  Pulmonary embolism.   SUBJECTIVE:  [name] [name] is doing a little better.  Her pain is better  controlled on Dilaudid.  She says she has a headache, but she is otherwise  okay.  She has no other new complaints.  She still has some chest  discomfort.  She has still got drainage from her Jackson-Pratt drain.  She  has had no other chest pain like her cardiac disease.  She has some  incisional pain and headache.   Her exam shows her temperature is 97.7, pulse is 92, respirations 19, blood  pressure 126/73.  Her chest is clear.  Her heart is regular.  Her abdomen is  soft.  She does not have significant splinting now.   White count is 10,400, hemoglobin is 12.9, platelets 304.  Pro-time 15.3.   ASSESSMENT:  She is overall I think better.  She certainly has significant  pulmonary emboli.  Plan is to get her up moving around a little bit.  She  can have Tylenol of course for her headache.  Continue with Lovenox,  continue with Coumadin.  I will notify Dr. Mariel Sleet.      Edward L. Juanetta Gosling, M.D.  Electronically Signed     ELH/MEDQ  D:  03/10/2006  T:  03/10/2006  Job:  [id number]

## 2010-09-24 NOTE — Op Note (Signed)
[name] [name], [name] [name]                   ACCOUNT NO.:  [id number]   MEDICAL RECORD NO.:  [id number]          PATIENT TYPE:  AMB   LOCATION:  DSC                          FACILITY:  [facility]   PHYSICIAN:  Currie Paris, M.D.DATE OF BIRTH:  August 07, 1955   DATE OF PROCEDURE:  01/18/2006  DATE OF DISCHARGE:  01/19/2006                                 OPERATIVE REPORT   OFFICE MEDICAL RECORD NUMBER:  [id number].   PREOPERATIVE DIAGNOSIS:  Left breast cancer, lower outer quadrant.   POSTOPERATIVE DIAGNOSIS:  Left breast cancer, lower outer quadrant.   OPERATION:  Left partial mastectomy with blue dye injection and axillary  sentinel lymph node biopsy (two nodes).   SURGEON:  Dr. Jamey Ripa.   ANESTHESIA:  General.   CLINICAL HISTORY:  This is a 50-year lady with a recently found left breast  mass which was palpable and looked like it had some associated skin  dimpling.  MRI showed no other lesions but the index lesion.  The patient  elected to proceed to partial mastectomy with sentinel node evaluation.   DESCRIPTION OF PROCEDURE:  The patient was seen in the holding area, and she  had no further questions.  She had forgotten that she needed to have an  injection for the sentinel node portion of the case, but that was reviewed.  She understood that if her node were positive she would need a node  dissection.   After review all of this, the left breast was initialed as the operative  side.  She was injected by nuclear medicine with technetium.   The patient was then taken to the operating room, and after satisfactory  general endotracheal anesthesia had been obtained, the periareolar area was  prepped with alcohol, and the time-out occurred.   I then injected dilute methylene blue (2 mL methylene blue in 3 mL of  injectable saline) subareolarly and massaged this in.  The breast was then  prepped and draped.  Using Neoprobe, I found a slight area of activity in  the axilla.  Her counts  over the nipple-areolar area got up to counts as  high as only 600, so for some reason, we had lower than average counts even  in the injection site.   I went ahead and made a transverse axillary incision directly over the area  identified by the Neoprobe.  As I got through the fatty tissue, I saw tiny  blue lymphatic and traced that down into a large blue lymph node which was  soft and totally excised.  Once this was excised, this had counts of 60 at  the maximum.   Having removed that, I carefully looked for any other blue lymph nodes and  did not see any.  The Neoprobe showed counts down to zero with that one node  removed.  Just inferior to where this first node was found, I felt a very  firm node which I also excised.  It did not have any blue dye nor  radioactivity, but I was concerned about it because it felt much harder than  the  other node that I had already removed, and there was nothing else in the  axilla this firm.  Both were sent for touch preps.   A pack was placed, and I turned my attention back to the breast.  The  patient had been ultrasounded in the OR just prior to starting to confirm  the location of the tumor which was readily palpable and actually visible as  some skin dimpling.  I made an elliptical incision to go well wide around  the mass so that we had a good excision of skin since this was so  superficial and then went directly deeply going well around the mass in all  directions down to chest wall.  When this was completely excised, I palpated  the specimen, and although I thought we had adequate margins by palpation  all around, the closest margin was in the vicinity of the superior lateral  aspect of the specimen.  I therefore took some additional margin at that one  location.   Once I made sure everything was dry, we injected some Marcaine to help with  postoperative analgesia.  The breast was closed with some 2-0 Vicryls trying  to diminish a little  bit of the dead space and the breast was primary fatty  tissue. I wanted to be sure that it would have some normal shape here.  The  skin was then closed with a running 3-0 Monocryl.  At the end of the case,  we applied Steri-Strips.   Attention was turned back to the axilla.  By this time, Dr. Debby Bud had  reported that both nodes were negative.  We checked for hemostasis and  everything was dry.  The incision was injected with some Marcaine for  postoperative analgesia and closed with 3-0 Vicryl, 4-0 Monocryl  subcuticular and some Dermabond.   The patient tolerated the procedure well.  There were no operative  complications.  All counts were correct.      Currie Paris, M.D.  Electronically Signed     CJS/MEDQ  D:  01/18/2006  T:  01/19/2006  Job:  [id number]   cc:   [name]. Eden Emms, MD, [facility]

## 2010-09-24 NOTE — Procedures (Signed)
NAMERUSTI, ARIZMENDI                   ACCOUNT NO.:  [id number]   MEDICAL RECORD NO.:  [id number]          PATIENT TYPE:  REC   LOCATION:  RAD                           FAC[facility]   PHYSICIAN:  Peter C. Eden Emms, MD, FACCDATE OF BIRTH:  [birth year]   DATE OF PROCEDURE:  DATE OF DISCHARGE:                                ECHOCARDIOGRAM   INDICATION:  [name] was referred for a 2-D echocardiogram by Dr.  Mariel Sleet for followup chemotherapy.   FINDINGS:  Left ventricular cavity size was mildly dilated.  There was  mild LVH.  Ejection fraction was 60%.  There was no change from the pre-  chemotherapy echocardiogram done Sep 23, 2005.  There were no regional  wall motion abnormalities and LV function was excellent.  Mitral, aortic  and tricuspid valves are structurally normal.  There is no significant  valvular heart disease.  The aortic root was normal.  There was mild  left atrial enlargement.  Subcostal imaging revealed no atrioseptal  defect, no source of embolus and no effusion.   M-mode measurements included aortic measurement of 2.8 cm, left atrial  measurement of 4.1 cm.  Septal thickness was 12 mm.  Posterior wall  thickness was 11 mm.  LV diastolic dimension was 39 mm.  Systolic  dimension was 29 mm.   FINAL IMPRESSION:  1. Mild left ventricular hypertrophy and enlargement, ejection      fraction 60%; ejection fraction remains excellent and unchanged      from study of Sep 23, 2005.  2. Normal mitral, aortic and tricuspid valves.  3. Mild left atrial enlargement.  4. Normal right ventricle.  5. No pericardial effusion.      [name] [name]. Eden Emms, MD, [facility]  Electronically Signed     PCN/MEDQ  D:  05/22/2006  T:  05/23/2006  Job:  [id number]

## 2010-09-24 NOTE — Discharge Summary (Signed)
[name] [name], [name] [name]                   ACCOUNT NO.:  [id number]   MEDICAL RECORD NO.:  [id number]          PATIENT TYPE:  OIB   LOCATION:  6522                         FACILITY:  [facility]   PHYSICIAN:  Learta Codding, M.D. LHCDATE OF BIRTH:  12-20-1955   DATE OF ADMISSION:  10/08/2004  DATE OF DISCHARGE:  10/09/2004                           DISCHARGE SUMMARY - REFERRING   DISCHARGING PHYSICIAN:  Learta Codding, M.D.   SUMMARY OF HISTORY:  [name] [name] is a 55 year old white female, who underwent a  cardiac catheterization on Oct 01, 2004, which revealed a subtotal mid LAD  lesion with right-to-left collaterals.  She readmits for intervention on the  LAD.  Apparently she presented to the office on Sep 13, 2004, and was  evaluated by Dr. Dorethea Clan in [place], [place], with recurrence of  her chest discomfort.  Prior in 2004 did not show any ischemia on a stress  test.  She continues to smoke.  She has a history of hyperlipidemia and  obesity.   LABORATORY DATA:  H&H was 13.9 and 41.2, normal indices, platelets 313, WBC  6.2 on admission.  Subsequent hematology was unremarkable.  PTT was 27 and  PT 11.5.  Sodium 141, potassium 3.8, BUN 12, creatinine 0.6.  Subsequently  chemistry was unremarkable.  CK total MB was negative post procedure.  Chest  x-ray on Sep 24, 2004, was not active for any disease.  EKG showed normal  sinus rhythm, left axis deviation, early R waves and nonspecific ST-T wave  changes.   HOSPITAL COURSE:  [name] [name] was brought into the cardiac catheterization  laboratory.  Dr. Gala Romney performed Cypher stent to the LAD with IVUS.  Post catheterization, the catheterization site was intact and she was  ambulating without difficulty with anticipated discharge in the morning.  On  October 09, 2004, she was ambulating without difficulty.  Dr. Andee Lineman felt that  she should be on Plavix for at least one year, aspirin indefinitely, tobacco  cessation and followup with Dr. Dorethea Clan in  [place], [place].   DISCHARGE DIAGNOSES:  1.  Unstable angina.  2.  Coronary artery disease, status post stent as previously described.  3.  Obesity.  4.  Tobacco use.  5.  Hyperlipidemia.   DISPOSITION:  She is discharged home.   DISCHARGE MEDICATIONS:  Her new medications include Lopressor 12.5 mg b.i.d.  and nitroglycerin 0.4 mg p.r.n.  She was asked to continue her aspirin 81 mg  daily, Plavix 75 mg daily for one year, Crestor 20 mg q.h.s. and __________  130 mg daily.   ACTIVITY:  She was advised no heavy lifting, driving, sexual activity or  heavy exertion.   DIET:  Maintain a low-salt, low-fat, low-cholesterol diet.   WOUND CARE:  If she had any problems with her catheterization site, she was  asked to call us.   FOLLOWUP:  She will follow up with Dr. Dorethea Clan in the [place], office on October 22, 2004, at 1 p.m. and with Dr. Juanetta Gosling as needed.       EW/MEDQ  D:  10/09/2004  T:  10/10/2004  Job:  [id number]

## 2010-09-24 NOTE — Group Therapy Note (Signed)
NAME:  [name] [name], [name] [name]                   ACCOUNT NO.:  [id number]   MEDICAL RECORD NO.:  [id number]          PATIENT TYPE:  INP   LOCATION:  A220                          [facility]   PHYSICIAN:  Edward L. Juanetta Gosling, M.D.DATE OF BIRTH:  1956-02-06   DATE OF PROCEDURE:  DATE OF DISCHARGE:                                   PROGRESS NOTE   [name] [name] is better today.  She has no new complaints.  She did have some  problems with headache, and she had some problems with Dilaudid making her  nauseated, so she is back on morphine now.  The morphine gives her a  headache.  Otherwise she is feeling pretty well.  She has had some drainage  from her left nostril.   PHYSICAL EXAMINATION:  Her exam shows her temperature is 98.2, pulse 95,  respirations 19, blood pressure 103/59, O2 sat is 98% on 2 L.  CHEST:  Clear.  I have checked her staples, they are well healed, so we can remove those  today, but she is still draining more than 30 mL from her Jackson-Pratt  drain, so she cannot have that removed now.   PLAN:  My plan then is to continue with her medications and treatments and  follow.      Edward L. Juanetta Gosling, M.D.  Electronically Signed     ELH/MEDQ  D:  03/11/2006  T:  03/12/2006  Job:  [id number]

## 2010-09-24 NOTE — Cardiovascular Report (Signed)
[name] [name], [name] [name]                   ACCOUNT NO.:  [id number]   MEDICAL RECORD NO.:  [id number]          PATIENT TYPE:  OIB   LOCATION:  6501                         FACILITY:  [facility]   PHYSICIAN:  Charlton Haws, M.D.     DATE OF BIRTH:  08-17-1955   DATE OF PROCEDURE:  DATE OF DISCHARGE:                              CARDIAC CATHETERIZATION   INDICATIONS FOR PROCEDURE:  Chest pain, abnormal Cardiolite study.  Cine  catheterization was done using 4 French catheters from the right femoral  artery.  After initial images were done, we realized that we needed to  upgrade to 6 French sheaths due to the patient's large size and subtotal  occlusion of the LAD, we needed better visualization.   Left main coronary artery was normal.   Proximal LAD had 40% tubular disease.   The mid LAD was subtotally occluded.  There was faint filling of the distal  vessel.  There were right-to-left collaterals but the mid and distal LAD  were never very well visualized.   Circumflex coronary artery was normal.   Right coronary artery was dominant.  There was dominant 30% __________  lesions in the mid and distal vessel.   There were septal collaterals to the LAD.   RAO ventriculography:  RAO ventriculography showed an EF of 55%.  There was  minimal apical hypokinesis.   Aortic pressure was 158/80.  LV pressure was 158/14.   IMPRESSION:  I will have to review the films with Dr. Samule Ohm who is our  interventionalist today and the patient's two options are intervention to  the left anterior descending versus single vessel left internal mammary  artery to the left anterior descending.   Although the left anterior descending was not well visualized, I suspect it  would be graftable.   However, it may be in the patient's best interest initially to try to pass a  wire if this is a chronic subtotal occlusion, the wire may end up down the  diagonal and we may not be able to revascularize the patient.  She  would  appear to have left ventricular cavity dilatation with her Cardiolite study  and I suspect that in the long run, she will do better with some sort of  revascularization to the left anterior descending.      PN/MEDQ  D:  10/01/2004  T:  10/01/2004  Job:  [id number]   cc:   Vida Roller, M.D.  Fax: [phone]   Dr. Juanetta Gosling

## 2010-09-24 NOTE — Cardiovascular Report (Signed)
[name] [name], [name] [name]                   ACCOUNT NO.:  [id number]   MEDICAL RECORD NO.:  [id number]          PATIENT TYPE:  OIB   LOCATION:  6522                         [facility]ACILITY:  [facility]   PHYSICIAN:  Salvadore Farber, M.D. LHCDATE OF BIRTH:  [birth year]   DATE OF PROCEDURE:  10/08/2004  DATE OF DISCHARGE:                              CARDIAC CATHETERIZATION   PROCEDURE:  Intravascular ultrasound of the left anterior descending, drug  eluting stent placement in the mid left anterior descending, Starclose  closure of the right common femoral arteriotomy site.   INDICATIONS FOR PROCEDURE:  [name] is a 55 year old lady with cardiac risk  factors of obesity, ongoing tobacco abuse, and dyslipidemia.  She presents  with stable angina.  She underwent diagnostic angiography by Dr. Eden Emms last  week.  This demonstrated a 90% stenosis of the mid LAD with preserved left  ventricular systolic function and no significant disease in the circumflex  or RCA.  She was initiated on Plavix and returns today for planned  intervention.   PROCEDURE TECHNIQUE:  Informed consent was obtained.  Under 1% lidocaine  local anesthesia, a 6 French sheath was placed in the right common femoral  artery using the modified Seldinger technique.  Anticoagulation was  initiated with bivalirudin.  ACT was confirmed to be greater than 225  seconds.  A 6 [place] runway CLS 3.5 guide was advanced over a wire and  engaged in the ostium of the left main.  Prowater wire was advanced across  the lesion without difficulty.  The lesion was predilated using a 2.25 by 9  mm Maverick for two inflations at 6 atmospheres.  Intravascular ultrasound  was then performed.  This demonstrated diffuse plaque within the wall of the  vessel with luminal diameter of 2 to 2.25 mm but an adventitia of 3 mm.  I,  therefore, felt comfortable using a 2.5 mm stent.  A 2.5 by 13 mm Cypher was  advanced across the lesion and deployed at 14 atmospheres.  It  was post  dilated using a 2.5 by 12 mm Quantum at 16 atmospheres.  Repeat  intravascular ultrasound demonstrated no dissection, full stent expansion,  and complete apposition.  Repeat angiography demonstrated no residual  stenosis and TIMI III flow to the distal vasculature.  The arteriotomy was  then closed using a Starclose device.  Complete hemostasis was obtained.  She was then transferred to the holding area in stable condition.   COMPLICATIONS:  None.   IMPRESSION/RECOMMENDATION:  Successful drug eluting stent placement in the  mid LAD reducing stenosis from 80% to 0%.  The patient should be continued  on Plavix for at least a year.  Aspirin will be continued indefinitely.  She  will continue her statin.  Smoking cessation was again strongly advised.       WED/MEDQ  D:  10/08/2004  T:  10/08/2004  Job:  [id number]   cc:   Ramon Dredge L. Juanetta Gosling, M.D.  [address]  [place]  [place] [address]  Fax: [phone]   Vida Roller, M.D.  Fax: [phone]

## 2010-09-24 NOTE — Op Note (Signed)
NAMESUSIE, EHRESMAN                   ACCOUNT NO.:  [id number]   MEDICAL RECORD NO.:  [id number]          PATIENT TYPE:  OIB   LOCATION:  5734                         FACILITY:  [facility]   PHYSICIAN:  Currie Paris, M.D.DATE OF BIRTH:  07-09-1955   DATE OF PROCEDURE:  03/01/2006  DATE OF DISCHARGE:                                 OPERATIVE REPORT   OFFICE MEDICAL RECORD NUMBER [id number]   PREOPERATIVE DIAGNOSIS:  Stage II left breast cancer, lower outer quadrant.   POSTOPERATIVE DIAGNOSIS:  Stage II left breast cancer, lower outer quadrant.   OPERATION:  Left axillary dissection and Port-A-Cath placement.   SURGEON:  Currie Paris, M.D.   ASSISTANT:  Anselm Pancoast. Zachery Dakins, M.D.   ANESTHESIA:  General endotracheal.   CLINICAL HISTORY:  Ms. Surles is a 50-year lady with a recently diagnosed left  breast cancer.  At the time of her primary surgery, a sentinel node  evaluation was done and the preliminary report was that they were negative.  However, a positive node was found on permanent section.  After consultation  with her oncologist, she elected to proceed to node dissection.  In  addition, Port-A-Cath placed was planned for postoperative chemotherapy.   DESCRIPTION OF PROCEDURE:  The patient was seen in the holding area and she  had no further questions.  She confirmed left axillary dissection and port  placement as the planned procedures.  I marked the left side as the side for  the axillary dissection.   The patient was taken to the operating room.  After satisfactory general  endotracheal anesthesia had been obtained, the left breast and axillary area  were prepped and draped.  The time-out occurred.   I enlarged the prior axillary incision both anteriorly and posteriorly.  The  patient is quite obese and we had about 5 cm of subcutaneous fatty tissue to  go through before I could identify axillary fat contents.  I then was able  to divide that down to the chest wall so  I could identify chest wall and  anteriorly identified the pectoralis muscle.  There was some firm tissue  fairly superficially which I think was reactive from her prior sentinel node  biopsies.   I divided along the clavipectoral fascia at the edge of the pectoralis  major.  I stripped the axillary tissue off of that and off the pectoralis  minor inferiorly.  As I got further superior, I was able to identify the  axillary vein and then swept the axillary contents from the vein from medial  to lateral and from the veins inferiorly.  I identified and preserved both  the long thoracic and thoracodorsal nerves.  Bleeders were controlled either  with cautery or clips.  As I got further lateral, I was able to find the  thoracodorsal and then stripped the contents between the thoracodorsal and  lateral thoracic.  Finally, the more superficial axillary attachments were  divided and finally the attachments to the anterior edge of the latissimus  dorsi were divided all with cautery.  I did divide what appeared  to be 2  branches of second intercostal nerve and clipped those.   I irrigated to make sure everything was dry.  I checked both nerves to make  sure they functioned at the end and they did.   I closed this wound by placing a 19 Blake drain through a separate wound and  then close the subcu with 3-0 Vicryl and the skin with some staples.   Sterile dressings were applied.  We used all new gowns, gloves and  instruments and reprepped at the upper chest and neck area for Port-A-Cath  placement.   The patient was placed in Trendelenburg position.  With the initial attempt,  I was able to enter the right subclavian vein and threaded a guidewire into  the superior vena cava, checking with fluoroscopy.   I made a small anterior chest wall incision fairly medially where I thought  there would be less fatty tissue so that the port would be more palpable.  I  developed a pocket here for the port.   The catheter tubing was brought  through the subcutaneous tunnel.  The dilator and peel-away sheath were used  to dilate the tract over the guidewire.  The dilator and guidewire were  removed.  The catheter was flushed and threaded to a distance about 22 cm  and the peel-away sheath removed.  The catheter aspirated and irrigated  easily.  Using fluoroscopy, I was able to back this up a little bit until I  was at about 18 cm from the entry site of the skin at the subclavian.  This  appeared to put me in the distal superior vena cava.   The port itself was flushed and attached and a locking mechanism engaged.  It aspirated and irrigated easily.  It was sutured into the pocket was some  Vicryl sutures using the suture rings provided.   Final fluoroscopy was done and we appeared to have good positioning with no  kinks.   Everything appeared to be dry.  I closed with 3-0 Vicryl followed by 4-0  Monocryl subcuticular and some Dermabond.  I flushed with concentrated  aqueous heparin prior to closing.   Of note is that the port lies at the medial end of the incision and just  below the incision itself.  At the end of the case, it was readily palpable.      Currie Paris, M.D.  Electronically Signed     CJS/MEDQ  D:  03/01/2006  T:  03/02/2006  Job:  [id number]   cc:   Ramon Dredge L. Juanetta Gosling, M.D.  [name]. Eden Emms, MD, [facility]  Ladona Horns. Mariel Sleet, MD

## 2010-09-24 NOTE — Procedures (Signed)
   NAME:  [name] [name], [name] [name]                             ACCOUNT NO.:  [id number]   MEDICAL RECORD NO.:  [id number]                   PATIENT TYPE:  EMS   LOCATION:  ED                                   FACILITY:  [facility]   PHYSICIAN:  Edward L. Juanetta Gosling, M.D.             DATE OF BIRTH:  01-04-1956   DATE OF PROCEDURE:  04/08/2002  DATE OF DISCHARGE:  04/08/2002                                EKG INTERPRETATION   TIME AND DATE:  0454 on 04/08/02   INTERPRETATION:  The rhythm is sinus rhythm with a rate in the 70s.  Somewhat low voltage is seen throughout.  There is an intraventricular  conduction delay of the right bundle branch block type.   IMPRESSION:  Abnormal electrocardiogram.                                               Edward L. Juanetta Gosling, M.D.    ELH/MEDQ  D:  04/08/2002  T:  04/09/2002  Job:  [id number]

## 2010-09-24 NOTE — Procedures (Signed)
   [name] [name], [name] [name]                             ACCOUNT NO.:  [id number]   MEDICAL RECORD NO.:  [id number]                   PATIENT TYPE:   LOCATION:                                       FACILITY:   PHYSICIAN:  Vida Roller, M.D.                DATE OF BIRTH:  1956/01/22   DATE OF PROCEDURE:  09/23/2002  DATE OF DISCHARGE:                                    STRESS TEST   EXERCISE CARDIOLITE   INDICATION:  The patient is a 55 year old female with no known coronary  artery disease who presented with atypical chest discomfort.  Her cardiac  risk factors include tobacco abuse, hyperlipidemia, family history, and  questionable history of hypertension.   BASELINE DATA:  EKG showed sinus rhythm at 57 beats per minute with inverted  T waves in lead III.  Blood pressure was 150/92.   FINDINGS:  The patient exercised for a total of five minutes 36 seconds to  Bruce protocol stage 2 and 7.0 METS.  EKG showed no ischemic changes and no  arrhythmias.  The patient denied any chest discomfort and the test was  stopped secondary to fatigue.  Cardiolite was injected when target heart  rate was achieved.  Maximum heart rate was 147 beats per minute which is 86%  of predicted maximum heart but maximum blood pressure was 198/88.   Final images and results are pending M.D. review.     Amy Mercy Riding, P.A. [facility]                     Vida Roller, M.D.    AB/MEDQ  D:  09/23/2002  T:  09/23/2002  Job:  [id number]

## 2010-09-24 NOTE — Discharge Summary (Signed)
NAME:  [name] [name]                   ACCOUNT NO.:  [id number]   MEDICAL RECORD NO.:  [id number]          PATIENT TYPE:  INP   LOCATION:  A220                          FACILITY:  [facility]   PHYSICIAN:  Edward L. Juanetta Gosling, M.D.DATE OF BIRTH:  1955/12/10   DATE OF ADMISSION:  03/09/2006  DATE OF DISCHARGE:  11/06/2007LH                                 DISCHARGE SUMMARY   FINAL DISCHARGE DIAGNOSES:  1. Acute pulmonary emboli.  2. Breast cancer status post lumpectomy and lymph node dissection.  3. Status post Port-A-Cath placement.  4. Hypertension.  5. Gastroesophageal reflux disease.  6. Hyperlipidemia.  7. History of kidney stones.  8. Coronary artery occlusive disease status post stenting.  9. Status post cholecystectomy.  10.Hyperlipidemia.   HISTORY OF PRESENT ILLNESS:  [name] [name] is a 55 year old who has had multiple  medical problems. On October 24 she had a lymph node dissection after having  had a lumpectomy for breast cancer. She had been having some leg swelling  and had home nursing. The home nurse called Dr. Jamey Ripa who is her surgeon.  Had her get a venous study which was arranged for the morning of admission.  She did have the venous study which was negative, but because she was having  somewhat shortness of breath and back and chest pain she was sent to the  emergency room for evaluation. When she was seen in the emergency room she  had severe pain. She underwent a CT pulmonary embolism study which was  positive for bilateral pulmonary emboli.   PHYSICAL EXAMINATION:  VITAL SIGNS:  At the time of admission her blood  pressure 125/63, temperature 99.3, pulse 94, respirations 20, O2 sat was  97%.  HEENT:  Pupils are reactive. Nose and throat were clear.  CHEST:  Her chest showed splinting on the right more than the left.  HEART:  Regular without gallop.  ABDOMEN:  Soft.  EXTREMITIES:  Showed no edema. She had a Jackson-Pratt drain in place in the  left axilla. She also had  staples in the left axillary area.   LABORATORY DATA:  White count 11,100, hemoglobin 13.7, platelets 313.  Glucose 135. D-dimer was 1.2. pO2 67, pCO2 37, pH 7.43. BNP was less than  30. Cardiac markers negative.   HOSPITAL COURSE:  She was started on Lovenox. Given IV fluids, given pain  medication IV. She had some trouble tolerating pain medication, but  eventually it got pretty well controlled. She was placed on Coumadin. After  discussion with Dr. Eden Emms, her cardiologist, it was felt that she should  remain on Plavix and aspirin which was restarted. It had initially been  held, and she is discharged home in much improved condition on Plavix 75 mg  daily, Antara 130 mg daily, metoprolol 25 mg b.i.d., AcipHex 20 mg b.i.d.,  aspirin 81 mg daily, Zetia 10 mg daily, Crestor 20 mg daily, and Mepergan  Fortis one q.4 h. p.r.n. pain. She is intolerant to other pain medications.  She is going to have home health services. She is going to be on Coumadin  7.5 mg daily. Her INR went from 1.6 to 2.3 so I am going to have her reduce  the dose from the 10 she was getting in the hospital to 7.5. Have her  protime checked tomorrow. She has got an appointment to see Dr. Mariel Sleet in  his office tomorrow as well.      Edward L. Juanetta Gosling, M.D.  Electronically Signed     ELH/MEDQ  D:  03/14/2006  T:  03/14/2006  Job:  [id number]

## 2010-09-24 NOTE — H&P (Signed)
NAME:  [name] [name], [name] [name]                   ACCOUNT NO.:  [id number]   MEDICAL RECORD NO.:  [id number]          PATIENT TYPE:  INP   LOCATION:  A220                          FACILITY:  [facility]   PHYSICIAN:  Edward L. Juanetta Gosling, M.D.DATE OF BIRTH:  1955/06/21   DATE OF ADMISSION:  03/09/2006  DATE OF DISCHARGE:  LH                                HISTORY & PHYSICAL   REASON FOR ADMISSION:  Acute pulmonary embolism.   HISTORY:  [name] [name] is a 55 year old who was in her usual state of poor health  at home having had surgery on October 24 which was a lymph node dissection  after having had a lumpectomy.  She had been having some leg swelling and  was less able to take care of herself.  Her home nurse had called Dr. Jamey Ripa  who had asked that she have a venous study which was arranged for this  morning.  She came for the venous study which actually was negative; but was  having so much trouble shortness of breath and back and chest pain that she  was sent to the emergency room for evaluation.  In the emergency room she  was noted to have severe pain, and underwent a CT pulmonary embolism study  which was positive for bilateral pulmonary emboli.  She had significant  pleuritic pain.  Her pain was originally relieved in the emergency room with  Toradol.  She has recently had a diagnosis of breast cancer made; and has  been undergoing workup for that.  She has had a Port-A-Cath and had a left  axillary dissection done on October 24.  She still has a Jackson-Pratt drain  in place.  She still has staples as well.   PAST MEDICAL HISTORY:  Positive for hypertension, gastroesophageal reflux  disease, hyperlipidemia, kidney stones, coronary artery occlusive disease  status post stenting.  She has had a cholecystectomy.  She has had a  lymphadenectomy.   SOCIAL HISTORY:  She does not smoke.  She does not drink any alcohol.  She  does not use any other drugs.   FAMILY HISTORY:  Positive for coronary disease in  several family members.  Her father had thrombophlebitis, but it is not clear if he had pulmonary  emboli.   MEDICATIONS:  1. Plavix 75 mg daily.  2. Enterra 130 mg daily.  3. Metoprolol 25 mg b.i.d.  4. AcipHex 20 mg b.i.d.  5. Aspirin 81 mg daily.  6. Zetia 10 mg daily.  7. Crestor 20 mg daily.   ALLERGIES:  She is allergic to codeine, Duricef and Talwin.   REVIEW OF SYSTEMS:  She says she has just had more and more difficulty with  managing at home because of her pain.  She has not had any diarrhea.  She  has not had any other clotting as far she knows.   PHYSICAL EXAM:  VITAL SIGNS:  Shows that her temperature is 99.3, pulse 94,  respirations 20, blood pressure 125/63, O2 saturation is 97% on 2 liters.  HEENT:  Her pupils are equal round react to  light and accommodation.  Her  nose and throat are clear.  Mucous membranes are moist.  NECK:  Supple without masses.  CHEST:  Shows that she is splinting the right more than the left.  Heart:  Regular without gallop.  ABDOMEN:  Soft.  EXTREMITIES:  Actually do not show any edema; and her CNS examination is  grossly intact.  She has a Jackson-Pratt drain in place; and also has  staples.  According to Dr.  Jamey Ripa we can remove the Jackson-Pratt drain  when she gets less than 30 mL drainage daily; and we can remove the staples  after about 10 days which will be approximately November 3rd or 4th.   Her lab work shows white count 11,100, hemoglobin 13.7, platelets 313.  BMET  essentially normal.  Glucose 135.  D-dimer 1.2, ProTime 13.5, blood gas on  room air pO2 67, pCO2 of 37, pH of 7.43.  BNP less than 30, cardiac markers  are negative.  Urinalysis is essentially negative.  X-ray as mentioned.   ASSESSMENT:  She has bilateral pulmonary emboli.   PLAN:  To treat her with Lovenox and Coumadin.  She has multiple other  medical problems as well.  She has had recent surgery.  I have discussed her  situation with Dr. Jamey Ripa; and will  discuss her situation with Dr.  Mariel Sleet.      Edward L. Juanetta Gosling, M.D.  Electronically Signed     ELH/MEDQ  D:  03/09/2006  T:  03/10/2006  Job:  [id number]   cc:   Currie Paris, M.D.  [address]  [place]  [place] [address]   Ladona Horns. Mariel Sleet, MD  Fax: [phone]   [name]. Eden Emms, MD, [facility]  [address]  [address]  [place]  [place] [address]

## 2010-09-24 NOTE — Group Therapy Note (Signed)
NAME:  [name] [name], [name] [name]                   ACCOUNT NO.:  [id number]   MEDICAL RECORD NO.:  [id number]          PATIENT TYPE:  INP   LOCATION:  A220                          FACILITY:  [facility]   PHYSICIAN:  Edward L. Juanetta Gosling, M.D.DATE OF BIRTH:  1955/08/23   DATE OF PROCEDURE:  03/14/2006  DATE OF DISCHARGE:                                   PROGRESS NOTE   [name] [name] is overall about the same.  She says she feels well.  She has no  complaints.   PHYSICAL EXAMINATION:  VITAL SIGNS:  Her O2 saturation is 95% on room air.  Her temperature is 97.8, pulse is 75, respirations 20, blood pressure  123/66.  SKIN:  Her surgical drain was removed yesterday.  CHEST:  Her chest pain has resolved. Her chest is clear.  HEART:  Regular.  ABDOMEN:  Soft.   Prothrombin time 26.8, with an INR of 2.3 today.   ASSESSMENT:  She is much improved.   PLAN:  For discharge home today.  Please see discharge summary for details.      Edward L. Juanetta Gosling, M.D.  Electronically Signed     ELH/MEDQ  D:  03/14/2006  T:  03/14/2006  Job:  [id number]

## 2010-09-24 NOTE — Op Note (Signed)
NAMEMARLAINE, [name] [name]                   ACCOUNT NO.:  [id number]   MEDICAL RECORD NO.:  [id number]          PATIENT TYPE:  AMB   LOCATION:  DSC                          FACILITY:  [facility]   PHYSICIAN:  Currie Paris, M.D.DATE OF BIRTH:  [birth year]   DATE OF PROCEDURE:  07/21/2006  DATE OF DISCHARGE:                               OPERATIVE REPORT   OFFICE MEDICAL RECORD NUMBER:  [id number].   PREOPERATIVE DIAGNOSIS:  Unneeded Port-A-Cath.   POSTOPERATIVE DIAGNOSIS:  Unneeded Port-A-Cath.   OPERATION:  Removal Port-A-Cath.   SURGEON:  Currie Paris, M.D.   ANESTHESIA:  Local.   CLINICAL HISTORY:  [name] has finished her chemo and wished to have  her port removed.  She has been off her Coumadin for a few days and  wished to have it done today.   DESCRIPTION OF PROCEDURE:  The patient seen in the minor procedure room  and we confirmed plans for removal Port-A-Cath.  The Port-A-Cath site  was identified.  I anesthetized the port site with 1% Xylocaine with epinephrine.  We  waited about 10 minutes for good local effect.  The area was then  prepped with Betadine.   The old scar was incised.  The port was identified in its pocket and  backed out of the pocket.  I put a 3-0 Vicryl suture around the tubing  tract and then backed the tubing out and tied the suture down.  There  was no significant bleeding.  The wound was then closed with 3-0 Vicryl  and Dermabond on the skin.   The patient tolerated procedure well with no operative complications.      Currie Paris, M.D.  Electronically Signed     CJS/MEDQ  D:  07/21/2006  T:  07/22/2006  Job:  [id number]   cc:   Ladona Horns. Mariel Sleet, MD  Oneal Deputy Juanetta Gosling, M.D.

## 2010-10-21 ENCOUNTER — Encounter: Payer: Self-pay | Admitting: Cardiovascular Disease

## 2010-11-09 ENCOUNTER — Other Ambulatory Visit: Payer: Self-pay | Admitting: Pulmonary Disease

## 2010-11-09 DIAGNOSIS — Z9889 Other specified postprocedural states: Secondary | ICD-10-CM

## 2010-11-09 DIAGNOSIS — Z853 Personal history of malignant neoplasm of breast: Secondary | ICD-10-CM

## 2010-11-26 ENCOUNTER — Encounter (HOSPITAL_COMMUNITY): Payer: Managed Care, Other (non HMO) | Attending: Oncology | Admitting: Oncology

## 2010-11-26 ENCOUNTER — Encounter (HOSPITAL_COMMUNITY): Payer: Self-pay | Admitting: Oncology

## 2010-11-26 DIAGNOSIS — I2699 Other pulmonary embolism without acute cor pulmonale: Secondary | ICD-10-CM | POA: Insufficient documentation

## 2010-11-26 DIAGNOSIS — C50919 Malignant neoplasm of unspecified site of unspecified female breast: Secondary | ICD-10-CM

## 2010-11-26 NOTE — Progress Notes (Signed)
This office note has been dictated.

## 2010-11-26 NOTE — Progress Notes (Signed)
CC:   Currie Paris, M.D. Edward L. Juanetta Gosling, M.D. Oneita Hurt, M.D.  DIAGNOSES: 1. Stage IIB (T2 N1 M0) adenocarcinoma of the left breast that was     grade 1 with a 4.2-cm primary with tubulolobular features with LVI     one of two positive sentinel nodes.  Seventeen additional nodes at     lymph node dissection were negative.  ER receptors were 90%.  PR     receptors 96%.  HER2/neu was negative.  Ki-67 marker was low at 3%     interestingly.  She received EC x4 cycles followed by radiation     therapy which finished as of 08/22/2006 by Dr. Jackelyn Knife.  She was     then started on Aromasin on 08/27/2006 and has until the end of     April 2013 for 5 full years of therapy.  We chose not to give her     tamoxifen because of her recent history of pulmonary emboli. 2. Pulmonary emboli postoperatively which resolved.  She took Coumadin     for 6 months. 3. Chronic constipation with excessive gas formation she states when     she is on a high-fiber diet. 4. Talwin intolerance. 5. Duricef allergy which gives her urticaria, shortness of breath. 6. Cholecystectomy in 1994. 7. Right ganglion cyst removal in 1979. 8. Reactive depression at the loss of her son years ago from acute     leukemia. 9. Codeine intolerance. 10.Obesity. 11.Appendectomy in 1963. 12.Bell's palsy on the right in 1991 with complete regression.  Her vital signs still show that she is quite overweight at 240 pounds, actually up from 219 in January.  She has been as high as 287 in December 2009.  She is still constipated, that is the biggest problem, with excessive gas when she takes laxatives or too much fiber in her diet so she avoids a lot of the fibrous foods to excess.  Otherwise she is doing well.  She is not aware of blood in her stools.  No lumps or bumps anywhere.  She is anxious every time she comes here.  Her physical exam otherwise, no adenopathy in any location.  Heart exam is normal without  murmur, rub or gallop.  Breast exam:  A little tender at the site of the surgical incision on the left but no masses in either breast.  Lungs are clear to auscultation and percussion.  Legs without edema, cyanosis or clubbing. Her abdomen is obese, nontender with decreased bowel sounds.  No masses. She will continue the aromatase inhibitor for 5 full years.  I will see her in 6 months.  Will get labs at that time.    ______________________________ Ladona Horns. Mariel Sleet, MD ESN/MEDQ  D:  11/26/2010  T:  11/26/2010  Job:  [id number]

## 2010-12-06 ENCOUNTER — Encounter: Payer: Self-pay | Admitting: Cardiovascular Disease

## 2010-12-10 ENCOUNTER — Encounter: Payer: Self-pay | Admitting: Cardiovascular Disease

## 2010-12-10 ENCOUNTER — Ambulatory Visit (INDEPENDENT_AMBULATORY_CARE_PROVIDER_SITE_OTHER): Payer: Managed Care, Other (non HMO) | Admitting: Cardiovascular Disease

## 2010-12-10 DIAGNOSIS — I2510 Atherosclerotic heart disease of native coronary artery without angina pectoris: Secondary | ICD-10-CM | POA: Insufficient documentation

## 2010-12-10 DIAGNOSIS — Z8679 Personal history of other diseases of the circulatory system: Secondary | ICD-10-CM

## 2010-12-10 MED ORDER — NITROGLYCERIN 0.4 MG SL SUBL: 0.4000 mg | SUBLINGUAL_TABLET | SUBLINGUAL | Status: DC | PRN

## 2010-12-10 NOTE — Progress Notes (Signed)
[name] [name] returns today for follow-up. 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 She has been doing well. Since I last saw her she has had follow-up breast cancer treatment.  She had a lumpectomy and adjuvant therapy including chemo.  She has a mamogram scheduled next month and this year marks her 5 year survival She has had previous cardiomegaly. She is a previous smoker with hyperlipidemia and coronary disease. She had a stent to the LAD in May 2006. It was a drug-eluting stent. Since that time she has not had any significant chest pain. She has lost about 40lb with weight watchers and I congratulated her on this. Her GERD is gone and she has better exercise toleracne. Her previous angina was throat and neck tightness. She has not had any but needs a refill on nitro. Since her stent was over 5 years ago and her Plavix is expensive I told her she could stop it and continue ASA . There has been no cough, pleuritic pain or wheezing. She does have seasonal allergies.   She seems to have slept wrong on her left shoulder and has muscular pain with ABduction.  Needs a refill on nitro   ROS: Denies fever, malais, weight loss, blurry vision, decreased visual acuity, cough, sputum, SOB, hemoptysis, pleuritic pain, palpitaitons, heartburn, abdominal pain, melena, lower extremity edema, claudication, or rash.  All other systems reviewed and negative  General: Affect appropriate Healthy:  appears stated age HEENT: normal Neck supple with no adenopathy JVP normal no bruits no thyromegaly Lungs clear with no wheezing and good diaphragmatic motion Heart:  S1/S2 no murmur,rub, gallop or click PMI normal Abdomen: benighn, BS positve, no tenderness, no AAA no bruit.  No HSM or HJR Distal pulses intact with no bruits No edema Neuro non-focal Skin warm and dry No muscular weakness  Pain with raising left arm over head   Current Outpatient Prescriptions  Medication Sig Dispense Refill  . acetaminophen (TYLENOL) 325  MG tablet Take 325 mg by mouth every 6 (six) hours as needed.        Marland Kitchen aspirin 81 MG tablet Take 81 mg by mouth daily.        . Calcium Carbonate Antacid (TUMS PO) Take by mouth daily.        . Choline Fenofibrate 135 MG capsule Take 135 mg by mouth daily.        Tery Sanfilippo Calcium (STOOL SOFTENER PO) Take by mouth daily.        Marland Kitchen exemestane (AROMASIN) 25 MG tablet Take 25 mg by mouth daily.        Marland Kitchen ezetimibe (ZETIA) 10 MG tablet Take 10 mg by mouth daily.        . fenofibrate micronized (ANTARA) 130 MG capsule Take by mouth daily.       . fish oil-omega-3 fatty acids 1000 MG capsule Take 2 g by mouth 2 (two) times daily.        . metoprolol tartrate (LOPRESSOR) 25 MG tablet Take 1 tab daily  90 tablet  3  . Multiple Vitamin (MULTIVITAMIN) capsule Take 1 capsule by mouth daily.        . nitroGLYCERIN (NITROSTAT) 0.4 MG SL tablet Place 0.4 mg under the tongue every 5 (five) minutes as needed. Never have needed to take      . rosuvastatin (CRESTOR) 20 MG tablet Take 20 mg by mouth daily.        . vitamin B-12 (CYANOCOBALAMIN) 500 MCG tablet Take 500 mcg by mouth  daily.        Marland Kitchen VITAMIN D, CHOLECALCIFEROL, PO Take 2,000 mg by mouth daily.          Allergies  Codeine; Duricef; Pentazocine lactate; Talwin; and Vicodin  Electrocardiogram:  Assessment and Plan

## 2010-12-10 NOTE — Patient Instructions (Signed)
Your physician recommends that you schedule a follow-up appointment in: YEAR WITH DR NISHAN  Your physician recommends that you continue on your current medications as directed. Please refer to the Current Medication list given to you today. 

## 2010-12-10 NOTE — Assessment & Plan Note (Signed)
F/U Dr Annamary Carolin next month.  Stable

## 2010-12-10 NOTE — Assessment & Plan Note (Signed)
Well controlled.  Continue current medications and low sodium Dash type diet.    

## 2010-12-10 NOTE — Assessment & Plan Note (Signed)
Distant history of stent LAD.  No angina  Nitro called in Continue risk factor modification and medical Rx

## 2010-12-10 NOTE — Assessment & Plan Note (Signed)
Cholesterol is at goal.  Continue current dose of statin and diet Rx.  No myalgias or side effects.  F/U  LFT's in 6 months. No results found for this basename: [facility]  Labs with Dr Juanetta Gosling in [place]

## 2010-12-30 ENCOUNTER — Ambulatory Visit: Payer: Managed Care, Other (non HMO) | Admitting: Orthopedic Surgery

## 2011-01-03 ENCOUNTER — Ambulatory Visit
Admission: RE | Admit: 2011-01-03 | Discharge: 2011-01-03 | Disposition: A | Discharge status: Home / Self Care | Payer: Managed Care, Other (non HMO) | Source: Ambulatory Visit | Attending: Pulmonary Disease | Admitting: Pulmonary Disease

## 2011-01-03 DIAGNOSIS — Z9889 Other specified postprocedural states: Secondary | ICD-10-CM

## 2011-01-03 DIAGNOSIS — Z853 Personal history of malignant neoplasm of breast: Secondary | ICD-10-CM

## 2011-01-04 ENCOUNTER — Encounter: Payer: Self-pay | Admitting: Oral Surgery

## 2011-01-04 ENCOUNTER — Ambulatory Visit (INDEPENDENT_AMBULATORY_CARE_PROVIDER_SITE_OTHER): Payer: Managed Care, Other (non HMO) | Admitting: Orthopedic Surgery

## 2011-01-04 ENCOUNTER — Encounter: Payer: Self-pay | Admitting: Orthopedic Surgery

## 2011-01-04 VITALS — Resp 18 | Ht 64.0 in | Wt 230.0 lb

## 2011-01-04 DIAGNOSIS — M7712 Lateral epicondylitis, left elbow: Secondary | ICD-10-CM | POA: Insufficient documentation

## 2011-01-04 DIAGNOSIS — M542 Cervicalgia: Secondary | ICD-10-CM

## 2011-01-04 DIAGNOSIS — M7710 Lateral epicondylitis, unspecified elbow: Secondary | ICD-10-CM

## 2011-01-04 DIAGNOSIS — M25519 Pain in unspecified shoulder: Secondary | ICD-10-CM

## 2011-01-04 MED ORDER — METHYLPREDNISOLONE ACETATE 40 MG/ML IJ SUSP: 40.0000 mg | Freq: Once | INTRAMUSCULAR | Status: DC

## 2011-01-04 NOTE — Progress Notes (Signed)
Chief complaint: LEFT upper extremity pain HPI:(33) 55 year old female status post breast surgery 5 years ago cancer free recently normal mammogram presents with sudden onset of pain in the LEFT arm starting on August 3.  She complains of sharp dull constant pain in the axillary region inferior acromial region which radiates down into the index and long finger on occasion and is associated with pain in the lateral portion of the elbow.  Her pain is exacerbated with forward elevation and extension.  She has taken some ibuprofen and Tylenol in a topical cream as well as ice which seems to improve her symptoms.  She has trouble picking things up and reaching for things such as a light switch    ROS:(2) She reports constipation and seasonal ALLERGIES otherwise her review of systems is negative  PFSH: (1) She did not list them but on further questioning was found to have coronary artery disease and hypertension  She is ALLERGIC to codeine Duricef Talwin and hydrocodone.  Physical Exam(12) GENERAL: normal development   CDV: pulses are normal   Skin: normal  Lymph: nodes were not palpable/normal  Psychiatric: awake, alert and oriented  Neuro: normal sensation  MSK Cervical spine exam she has normal range of motion RIGHT LEFT rotation with some discomfort in the cervical spine.  Motion is not limited.  LEFT shoulder she has smooth forward elevation up to 120.  She is painful response to supraspinatus resistance.  She has an intact supraspinatus tendon however.  She has a positive impingement sign.  Her shoulder is stable.  She has painful internal rotation.  The lateral epicondyle is tender the elbow range of motion is normal.  Reflexes are 2+ at the elbow area  Imaging: AP and lateral of the LEFT shoulder:  Assessment: Difficult assessment of the patient's problem at this time  Differential diagnosis shoulder pain with impingement syndrome Differential diagnoses cervical disc disease with  radiculopathy Differential diagnosis lateral epicondylitis    Plan: I will speak with her oncologist because she is worried about getting a subacromial injection.  I think she should have a course of physical therapy to address all the issues  As far as medication does she can't take any analgesics so I will let her primary care physician dose her medication.   Subacromial Shoulder Injection Procedure Note  Pre-operative Diagnosis: left RC Syndrome  Post-operative Diagnosis: same  Indications: pain   Anesthesia: ethyl chloride   Procedure Details   Verbal consent was obtained for the procedure. The shoulder was prepped withalcohol and the skin was anesthetized. A 20 gauge needle was advanced into the subacromial space through posterior approach without difficulty  The space was then injected with 3 ml 1% lidocaine and 1 ml of depomedrol. The injection site was cleansed with isopropyl alcohol and a dressing was applied.  Complications:  None; patient tolerated the procedure well.

## 2011-01-12 ENCOUNTER — Ambulatory Visit (HOSPITAL_COMMUNITY)
Admission: RE | Admit: 2011-01-12 | Discharge: 2011-01-12 | Disposition: A | Discharge status: Home / Self Care | Payer: Managed Care, Other (non HMO) | Source: Ambulatory Visit | Attending: Orthopedic Surgery | Admitting: Orthopedic Surgery

## 2011-01-12 DIAGNOSIS — M6281 Muscle weakness (generalized): Secondary | ICD-10-CM | POA: Insufficient documentation

## 2011-01-12 DIAGNOSIS — E785 Hyperlipidemia, unspecified: Secondary | ICD-10-CM | POA: Insufficient documentation

## 2011-01-12 DIAGNOSIS — M25519 Pain in unspecified shoulder: Secondary | ICD-10-CM | POA: Insufficient documentation

## 2011-01-12 DIAGNOSIS — I10 Essential (primary) hypertension: Secondary | ICD-10-CM | POA: Insufficient documentation

## 2011-01-12 DIAGNOSIS — M25619 Stiffness of unspecified shoulder, not elsewhere classified: Secondary | ICD-10-CM | POA: Insufficient documentation

## 2011-01-12 DIAGNOSIS — IMO0001 Reserved for inherently not codable concepts without codable children: Secondary | ICD-10-CM | POA: Insufficient documentation

## 2011-01-12 NOTE — Progress Notes (Signed)
Occupational Therapy Evaluation  Patient Name: [name] [name] Date: 01/12/2011 Time In 355 Time Out 440  OT Evaluation 355-415 Manual Therapy 416-430 Visit 1/16 Reassessment date: 02/09/11 DIAGNOSIS:  Left Tennis Elbow, Rotator Cuff Syndrome, Cervicalgia Referring MD:  Dr. Romeo Apple   S:  I woke up about 5 weeks ago and couldnt move my left arm! Limitations: Pertinent History:  [name] [name] awoke with little to no mobility in her left shoulder 5 weeks ago.  It has improved, but it is not back to baseline.  She consulted with Dr. Romeo Apple who administered a cortisone injection to her left shoulder.   Pain Assessment Currently in Pain?: Yes Pain Score:   5 Pain Location: Arm Pain Orientation: Left Pain Type: Acute pain Past Medical History:  Past Medical History  Diagnosis Date  . CAD (coronary artery disease) 2006    DES to LAD  . Chest pain   . HTN (hypertension)   . Hyperlipidemia   . GERD (gastroesophageal reflux disease)   . Breast cancer   . Edema     legs  . Anserine bursitis   . Knee pain   . Cardiomegaly   . MI (myocardial infarction)   . Pulmonary embolism   . Kidney stones    Past Surgical History:  Past Surgical History  Procedure Date  . Colonoscopy with cold snare and snare cautery polypectomy   . Cardiac catheterization     DES to LAD  . Cholecystectomy   . Lymphadenectomy   . Appendectomy   . Tonsilectomy, adenoidectomy, bilateral myringotomy and tubes   . Cystectomy     rt. palm  . C/s tubal ligation   . Stent implant   . Breast lumpectomy   . Tonsillectomy     Precautions/Restrictions   History of Left Breast Cancer   Assessment LUE AROM (degrees) Left Shoulder Flexion  0-170: 150 Degrees Left Shoulder ABduction 0-40: 80 Degrees Left Shoulder Internal Rotation  0-70: 50 Degrees (with shoulder abducted to 90) Left Shoulder External Rotation  0-90: 50 Degrees (with shoulder abducted to 90) Left Elbow Flexion/Extension 0-135-150: 143   (extension is 0) Left Forearm Pronation  0-80-90:  (WFL) Left Forearm Supination  0-80-90:  (WFL) Left Wrist Extension 0-70:  (WFL) Left Wrist Flexion 0-80:  (WFL) LUE Strength LUE Overall Strength Comments: shoulder strength is 4-/5, elbow strength is 4+/5 Gross Grasp:  (flexed elbow/extended elbow right 65/65, left 60/55)   Exercise/Treatments  Cervical Stretches Research officer, political party:  (educated as hep) Therapist, music:  (educated as hep) Diplomatic Services operational officer:  (educated as hep) Lower Arboriculturist:  (educated as hep) Dentist:  (educated as hep) Financial risk analyst:  (educated as hep) Manual Therapy Manual Therapy: Myofascial release Myofascial Release: MFR, manual stretching, and trigger point release to left scapular, trapezius, SCM, rhomboid, upper arm, flexor and extensor forearm.  Gentle traction to left arm through full flexion and abduction arc.  PROM to shoulder and elbow through all ranges x 5 reps each.   Goals Short Term Goals (4 weeks) Short Term Goal 1: Patient will be I with HEP. Short Term Goal 2: Patient will increase LUE PROM to [facility] for increased I at work. Short Term Goal 3: Patient will increase left shoulder strength to 4/5 for increased ability to lift binders at work. Short Term Goal 4: Patient will decrease pain in left shoulder and elbow to 3/10 during work activities. Short Term Goal 5: Patient will decrease fascial restrictions from max to mod.  Long Term Goals (8 weeks) Long Term Goal 1: Patient will return to prior level of I with all B/IADLs, work, and leisure activities. Long Term Goal 2: Patient will incorporate ergonomic recommendations offered by therapist for desk setup at work. Long Term Goal 3: Patient will increase LUE AROM to WNL for increased independence reaching overhead at work. Long Term Goal 4: Patient will increase LUE strength to 5/5 and increase left grip strength by 5 pounds for increased independence  lifting binders at work. Long Term Goal 5: Patient will decrease pain level to 1/10 in her LUE. Long Term Goal 6: Patient will decrease fascial restrictions to minimal in her LUE. End of Session Patient Active Problem List  Diagnoses  . BREAST CANCER  . HYPERLIPIDEMIA  . CAD  . GERD  . KNEE PAIN  . ANSERINE BURSITIS  . EDEMA  . CHEST PAIN  . HYPERTENSION, HX OF  . Pulmonary embolus  . CAD (coronary artery disease)  . Shoulder pain  . Neck pain  . Left tennis elbow  . Muscle weakness (generalized)   End of Session Activity Tolerance: Patient tolerated treatment well General Behavior During Session: [facility] for tasks performed Cognition: [facility] for tasks performed OT Assessment and Plan  A: Patient presents with increased pain and restrictions and decreased mobility in her left shoulder and elbow, causing decreased use of her dominant LUE with ADLs. Prognosis: Excellent OT Frequency: Min 2X/week OT Duration: 8 weeks OT Treatment/Interventions: Self-care/ADL training;Therapeutic exercise;Therapeutic activities;Patient/family education;Manual therapy;Other (comment) (Modalities PRN Please review contraindications for Breast CA)  P:  Skilled occupational therapy intervention to decrease pain and increase mobility in her LUE.  MFR and Manual therapy as outlined above.  Review picture of her work area and provide recommendations to improve ergonomic setup.  Ther Ex:  supine PROM/AROM shoulder flexion, abduction, ext rot, int rot, prot, horiz abd, elbow flex and ext, seated x to v, w arms, thumbtacks, ball stretch, UBE, weighted wrist flex and ext stretch, tputty.    Time Calculation Start Time: 1555 Stop Time: 1640 Time Calculation (min): 45 min  Shirlean Mylar, OTR/L  01/12/2011, 4:56 PM

## 2011-01-19 ENCOUNTER — Ambulatory Visit (HOSPITAL_COMMUNITY)
Admission: RE | Admit: 2011-01-19 | Discharge: 2011-01-19 | Disposition: A | Discharge status: Home / Self Care | Payer: Managed Care, Other (non HMO) | Source: Ambulatory Visit | Attending: Occupational Therapy | Admitting: Occupational Therapy

## 2011-01-19 NOTE — Progress Notes (Addendum)
Occupational Therapy Treatment  Patient Name: LAQUANDRA CARRILLO MRN: [id number] Today's Date: 01/19/2011   Time In 450  Time Out 549 Manual Therapy 450-523 Therapeutic exercise 523-549  Visit 1/16  Reassessment date: 02/09/11  DIAGNOSIS: Left Tennis Elbow, Rotator Cuff Syndrome, Cervicalgia  Referring MD: Dr. Romeo Apple  HPI: Symptoms/Limitations Symptoms: I always hurts after working all day. Pain Assessment Pain Score:   4 Pain Location: Elbow Pain Orientation: Left    Exercise/Treatments Shoulder Exercises Shoulder Protraction: PROM;AROM;10 reps;Supine Shoulder Horizontal ABduction: PROM;AROM;10 reps;Supine Shoulder ABduction: PROM;AROM;10 reps;Supine Shoulder External Rotation: PROM;AROM;10 reps;Supine Shoulder Internal Rotation: PROM;AROM;10 reps;Supine Elbow Flexion: AROM;10 reps;Seated Elbow Extension: AROM;10 reps;Seated Additional ROM/Strengthening Exercises UBE (Upper Arm Bike): 3' forward 3' reverse Thumb Tacks: 1' "W" Arms: x 10 and 10 x to v Elbow Exercises Elbow Flexion: AROM;10 reps;Seated Elbow Extension: AROM;10 reps;Seated Additional Elbow Exercises UBE (Upper Arm Bike): 3' forward 3' reverse  Manual Therapy Manual Therapy: Myofascial release Myofascial Release: MFR, manual stretching, and trigger point release to left scapular, trapezius, SCM, rhomboid, upper arm, flexor and extensor forearm. Gentle traction to left arm through full flexion and abduction arc. PROM to shoulder and elbow through all ranges    Goals Short Term Goals Short Term Goal 1: Patient will be I with HEP. Short Term Goal 1 Progress: Progressing toward goal Short Term Goal 2: Patient will increase LUE PROM to [facility] for increased I at work. Short Term Goal 2 Progress: Progressing toward goal Short Term Goal 3: Patient will increase left shoulder strength to 4/5 for increased ability to lift binders at work. Short Term Goal 3 Progress: Progressing toward goal Short Term Goal 4: Patient  will decrease pain in left shoulder and elbow to 3/10 during work activities. Short Term Goal 4 Progress: Progressing toward goal Short Term Goal 5: Patient will decrease fascial restrictions from max to mod. Short Term Goal 5 Progress: Progressing toward goal Long Term Goals Long Term Goal 1: Patient will return to prior level of I with all B/IADLs, work, and leisure activities. Long Term Goal 1 Progress: Progressing toward goal Long Term Goal 2: Patient will incorporate ergonomic recommendations offered by therapist for desk setup at work. Long Term Goal 2 Progress: Progressing toward goal Long Term Goal 3: Patient will increase LUE AROM to WNL for increased independence reaching overhead at work. Long Term Goal 3 Progress: Progressing toward goal Long Term Goal 4: Patient will increase LUE strength to 5/5 and increase left grip strength by 5 pounds for increased independence lifting binders at work. Long Term Goal 4 Progress: Progressing toward goal Long Term Goal 5: Patient will decrease pain level to 1/10 in her LUE. Long Term Goal 5 Progress: Progressing toward goal Additional Long Term Goals?: Yes Long Term Goal 6: Patient will decrease fascial restrictions to minimal in her LUE. Long Term Goal 6 Progress: Progressing toward goal End of Session Patient Active Problem List  Diagnoses  . BREAST CANCER  . HYPERLIPIDEMIA  . CAD  . GERD  . KNEE PAIN  . ANSERINE BURSITIS  . EDEMA  . CHEST PAIN  . HYPERTENSION, HX OF  . Pulmonary embolus  . CAD (coronary artery disease)  . Shoulder pain  . Neck pain  . Left tennis elbow  . Muscle weakness (generalized)   End of Session Activity Tolerance: Patient tolerated treatment well OT Assessment and Plan Clinical Impression Statement: added multiple new exercises.  Patient responded well to ball stretches but some mild discomfort from UBE but patient had decreased  pain after treatment. 3/10 Prognosis: Good OT Plan: Add theraputty and  weighted wrist stretch, patient had appointment at 6    01/19/2011, 5:55 PM

## 2011-01-20 ENCOUNTER — Ambulatory Visit (HOSPITAL_COMMUNITY)
Admission: RE | Admit: 2011-01-20 | Discharge: 2011-01-20 | Payer: Managed Care, Other (non HMO) | Source: Ambulatory Visit | Attending: Specialist | Admitting: Specialist

## 2011-01-20 DIAGNOSIS — M6281 Muscle weakness (generalized): Secondary | ICD-10-CM

## 2011-01-20 NOTE — Progress Notes (Signed)
Occupational Therapy Treatment  Patient Details  Name: [name] [name] MRN: [id number] Date of Birth: Sep 17, 1955  Today's Date: 01/20/2011 Time: 7846-9629 Time Calculation (min): 50 min Manual Therapy 1725-1815 Visit#: 3 of 16 Re-eval: 02/09/11 DIAGNOSIS: Left Tennis Elbow, Rotator Cuff Syndrome, Cervicalgia  Referring MD: Dr. Romeo Apple   Subjective S:  I dont know what she did yesterday, but it put me in more pain.   Pain Assessment Currently in Pain?: Yes Pain Score:   7 Pain Location: Other (Comment) (shoulder, scapular region, and neck)   O:  Exercise/Treatments   Manual Therapy Manual Therapy: Myofascial release Myofascial Release: MFR, manual stretching to left scapular, trapezius, SCM, rhomboid, upper arm, flexor and extensor forearm.  Gentle traction to left arm and to her neck.     Goals Short Term Goals Short Term Goal 1: Patient will be I with HEP. Short Term Goal 2: Patient will increase LUE PROM to [facility] for increased I at work. Short Term Goal 3: Patient will increase left shoulder strength to 4/5 for increased ability to lift binders at work. Short Term Goal 4: Patient will decrease pain in left shoulder and elbow to 3/10 during work activities. Short Term Goal 5: Patient will decrease fascial restrictions from max to mod. Long Term Goals Long Term Goal 1: Patient will return to prior level of I with all B/IADLs, work, and leisure activities. Long Term Goal 2: Patient will incorporate ergonomic recommendations offered by therapist for desk setup at work. Long Term Goal 3: Patient will increase LUE AROM to WNL for increased independence reaching overhead at work. Long Term Goal 4: Patient will increase LUE strength to 5/5 and increase left grip strength by 5 pounds for increased independence lifting binders at work. Long Term Goal 5: Patient will decrease pain level to 1/10 in her LUE. Additional Long Term Goals?: Yes Long Term Goal 6: Patient will decrease fascial  restrictions to minimal in her LUE. End of Session Patient Active Problem List  Diagnoses  . BREAST CANCER  . HYPERLIPIDEMIA  . CAD  . GERD  . KNEE PAIN  . ANSERINE BURSITIS  . EDEMA  . CHEST PAIN  . HYPERTENSION, HX OF  . Pulmonary embolus  . CAD (coronary artery disease)  . Shoulder pain  . Neck pain  . Left tennis elbow  . Muscle weakness (generalized)   End of Session Activity Tolerance: Patient tolerated treatment well General Behavior During Session: [facility] for tasks performed Cognition: [facility] for tasks performed OT Assessment and Plan Clinical Impression Statement: A:  Patient's pain level elevated due to previous session and has pain in new areas of neck.  All ther ex held today, and treatment focused on MFR.  At conclusion of therapy, pain had greatly diminshed. OT Plan: P:  Resume ther ex.  Please complete only gentle MFR, avoiding in deep tissue or trigger point release.   Shirlean Mylar, OTR/L  01/20/2011, 6:24 PM

## 2011-01-25 ENCOUNTER — Ambulatory Visit (HOSPITAL_COMMUNITY)
Admission: RE | Admit: 2011-01-25 | Discharge: 2011-01-25 | Disposition: A | Discharge status: Home / Self Care | Payer: Managed Care, Other (non HMO) | Source: Ambulatory Visit | Attending: Orthopedic Surgery | Admitting: Orthopedic Surgery

## 2011-01-25 NOTE — Progress Notes (Signed)
Occupational Therapy Treatment  Patient Details  Name: [name] [name] MRN: [id number] Date of Birth: Oct 16, 1955  Today's Date: 01/25/2011 Time: 4098-1191 Time Calculation (min): 67 min Visit#: 4  of 16   Re-eval: 02/09/11   Therapeutic Exercise 533-558 Manual Therapy 451-533 DIAGNOSIS: Left Tennis Elbow, Rotator Cuff Syndrome, Cervicalgia  Referring MD: Dr. Romeo Apple   Subjective Pain Assessment Pain Score:   2 Pain Location: Elbow Pain Orientation: Left Pain Type: Acute pain   Exercise/Treatments Supine Protraction: PROM;10 reps;AROM;15 reps Horizontal ABduction: PROM;10 reps;AROM;15 reps External Rotation: PROM;10 reps;AROM;15 reps Internal Rotation: PROM;10 reps;AROM;15 reps Flexion: PROM;10 reps;AROM;15 reps ABduction: PROM;10 reps;AROM;15 reps  ROM / Strengthening / Isometric Strengthening UBE (Upper Arm Bike): 3' forward 3' backward 1.0 "W" Arms: 10 X to V Arms: 10 Thumbtacks 1'   Manual Therapy Manual Therapy: Myofascial release Myofascial Release: MFR, manual stretching, and trigger point release to left scapular, trapezius, SCM, rhomboid, upper arm, flexor and extensor forearm. Gentle traction to left arm through full flexion and abduction arc. PROM to shoulder and elbow through all ranges  Gentle traction to neck.   Occupational Therapy Assessment and Plan OT Assessment and Plan Clinical Impression Statement: Resumed all ex.  Patients had 0/10 pain after manuel and cervical traction.  UBE in reverse increased patients pain to 3/10 in posterior shoulder. Rehab Potential: Good OT Plan: Add ball stretches.   Goals Short Term Goals Short Term Goal 1: Patient will be I with HEP. Short Term Goal 1 Progress: Progressing toward goal Short Term Goal 2: Patient will increase LUE PROM to [facility] for increased I at work. Short Term Goal 2 Progress: Progressing toward goal Short Term Goal 3: Patient will increase left shoulder strength to 4/5 for increased ability to lift  binders at work. Short Term Goal 3 Progress: Progressing toward goal Short Term Goal 4: Patient will decrease pain in left shoulder and elbow to 3/10 during work activities. Short Term Goal 4 Progress: Progressing toward goal Short Term Goal 5: Patient will decrease fascial restrictions from max to mod. Short Term Goal 5 Progress: Progressing toward goal Long Term Goals Long Term Goal 1: Patient will return to prior level of I with all B/IADLs, work, and leisure activities. Long Term Goal 1 Progress: Progressing toward goal Long Term Goal 2: Patient will incorporate ergonomic recommendations offered by therapist for desk setup at work. Long Term Goal 2 Progress: Progressing toward goal Long Term Goal 3: Patient will increase LUE AROM to WNL for increased independence reaching overhead at work. Long Term Goal 3 Progress: Progressing toward goal Long Term Goal 4: Patient will increase LUE strength to 5/5 and increase left grip strength by 5 pounds for increased independence lifting binders at work. Long Term Goal 4 Progress: Progressing toward goal Long Term Goal 5: Patient will decrease pain level to 1/10 in her LUE. Long Term Goal 5 Progress: Progressing toward goal Additional Long Term Goals?: Yes Long Term Goal 6: Patient will decrease fascial restrictions to minimal in her LUE. Long Term Goal 6 Progress: Progressing toward goal End of Session Patient Active Problem List  Diagnoses  . BREAST CANCER  . HYPERLIPIDEMIA  . CAD  . GERD  . KNEE PAIN  . ANSERINE BURSITIS  . EDEMA  . CHEST PAIN  . HYPERTENSION, HX OF  . Pulmonary embolus  . CAD (coronary artery disease)  . Shoulder pain  . Neck pain  . Left tennis elbow  . Muscle weakness (generalized)   End of Session Activity Tolerance:  Patient tolerated treatment well General Behavior During Session: [facility] for tasks performed Cognition: [facility] for tasks performed   Jeniece Hannis L. Noralee Stain, COTA/L  01/25/2011, 6:12 PM

## 2011-01-26 ENCOUNTER — Ambulatory Visit (HOSPITAL_COMMUNITY)
Admission: RE | Admit: 2011-01-26 | Discharge: 2011-01-26 | Disposition: A | Discharge status: Home / Self Care | Payer: Managed Care, Other (non HMO) | Source: Ambulatory Visit | Attending: Orthopedic Surgery | Admitting: Orthopedic Surgery

## 2011-01-26 DIAGNOSIS — M6281 Muscle weakness (generalized): Secondary | ICD-10-CM

## 2011-01-26 NOTE — Progress Notes (Signed)
Occupational Therapy Treatment  Patient Details  Name: [name] [name] MRN: [id number] Date of Birth: 10-21-1955  Today's Date: 01/26/2011 Time: 6213-0865 Time Calculation (min): 68 min Visit#: 5  of 16   Re-eval: 02/09/11 Therapeutic Exercise 520-546 (26') Manual Therapy 453-520 (27') Moist heat 546-601 (15') DIAGNOSIS: Left Tennis Elbow, Rotator Cuff Syndrome, Cervicalgia  Referring MD: Dr. Romeo Apple    Subjective Symptoms/Limitations Symptoms: I feel good today. Pain Assessment Currently in Pain?: Yes Pain Score:   1   Exercise/Treatments Supine Protraction: PROM;AROM;15 reps Horizontal ABduction: PROM;AROM;15 reps External Rotation: PROM;AROM;15 reps Internal Rotation: PROM;AROM;15 reps Flexion: PROM;AROM;15 reps ABduction: PROM;AROM;15 reps Therapy Ball Flexion: 20 reps ABduction: 20 reps Right/Left: 5 reps ROM / Strengthening / Isometric Strengthening UBE (Upper Arm Bike): hold secondary causing increased pain. "W" Arms: 10 X to V Arms: 10   Modalities Modalities: Moist Heat Manual Therapy Manual Therapy: Myofascial release Myofascial Release: MFR, manual stretching, and trigger point release to left scapular, trapezius, SCM, rhomboid, upper arm, flexor and extensor forearm. Gentle traction to left arm through full flexion and abduction arc. PROM to shoulder and elbow through all ranges Gentle traction to neck.  Moist Heat Therapy Number Minutes Moist Heat: 15 Minutes Moist Heat Location: Shoulder  Occupational Therapy Assessment and Plan OT Assessment and Plan Clinical Impression Statement: Hold on UBE secondary to causing increased pain at end of previous treatment when pain has decreased to 0/10.  Added ball circles. Rehab Potential: Good OT Plan: attempt wall wash.   Goals Short Term Goals Short Term Goal 1: Patient will be I with HEP. Short Term Goal 2: Patient will increase LUE PROM to [facility] for increased I at work. Short Term Goal 3: Patient will  increase left shoulder strength to 4/5 for increased ability to lift binders at work. Short Term Goal 4: Patient will decrease pain in left shoulder and elbow to 3/10 during work activities. Short Term Goal 5: Patient will decrease fascial restrictions from max to mod. Long Term Goals Long Term Goal 1: Patient will return to prior level of I with all B/IADLs, work, and leisure activities. Long Term Goal 2: Patient will incorporate ergonomic recommendations offered by therapist for desk setup at work. Long Term Goal 3: Patient will increase LUE AROM to WNL for increased independence reaching overhead at work. Long Term Goal 4: Patient will increase LUE strength to 5/5 and increase left grip strength by 5 pounds for increased independence lifting binders at work. Long Term Goal 5: Patient will decrease pain level to 1/10 in her LUE. Additional Long Term Goals?: Yes Long Term Goal 6: Patient will decrease fascial restrictions to minimal in her LUE. End of Session Patient Active Problem List  Diagnoses  . BREAST CANCER  . HYPERLIPIDEMIA  . CAD  . GERD  . KNEE PAIN  . ANSERINE BURSITIS  . EDEMA  . CHEST PAIN  . HYPERTENSION, HX OF  . Pulmonary embolus  . CAD (coronary artery disease)  . Shoulder pain  . Neck pain  . Left tennis elbow  . Muscle weakness (generalized)   End of Session Activity Tolerance: Patient tolerated treatment well  Tarquin Welcher L. Leman Martinek, COTA/L  01/26/2011, 5:53 PM

## 2011-02-02 ENCOUNTER — Ambulatory Visit (HOSPITAL_COMMUNITY)
Admission: RE | Admit: 2011-02-02 | Discharge: 2011-02-02 | Disposition: A | Discharge status: Home / Self Care | Payer: Managed Care, Other (non HMO) | Source: Ambulatory Visit | Attending: Orthopedic Surgery | Admitting: Orthopedic Surgery

## 2011-02-02 DIAGNOSIS — M6281 Muscle weakness (generalized): Secondary | ICD-10-CM

## 2011-02-02 NOTE — Progress Notes (Signed)
Occupational Therapy Treatment  Patient Details  Name: [name] [name] MRN: [id number] Date of Birth: [birth year]  Today's Date: 02/02/2011 Time: 2130-8657 Time Calculation (min): 60 min Visit#: 6  of 16   Re-eval: 02/09/11  Manual Therapy [phone] 20' Therapeutic Exercise  429-452 23' Moist heat  452-509 15' DIAGNOSIS: Left Tennis Elbow, Rotator Cuff Syndrome, Cervicalgia  Referring MD: Dr. Romeo Apple    Subjective Symptoms/Limitations Symptoms: I think I cracked a rib. Pain Assessment Currently in Pain?: Yes Pain Score:   1 Pain Location: Shoulder Pain Orientation: Left Pain Type: Acute pain   Exercise/Treatments Supine Protraction: PROM;Strengthening;10 reps;Weights Protraction Weight (lbs): 1# Horizontal ABduction: PROM;10 reps;Weights Horizontal ABduction Weight (lbs): 1# External Rotation: PROM;Strengthening;10 reps;Weights External Rotation Weight (lbs): 1# Internal Rotation: PROM;Strengthening;10 reps;Weights Internal Rotation Weight (lbs): 1# Flexion: PROM;Strengthening;10 reps;Weights Shoulder Flexion Weight (lbs): 1# ABduction: PROM;Strengthening;10 reps;Weights Shoulder ABduction Weight (lbs): 1# Therapy Ball Flexion: 20 reps ABduction: 20 reps Right/Left: 5 reps ROM / Strengthening / Isometric Strengthening UBE (Upper Arm Bike): hold secondary causing increased pain. Wall Wash: 2' Thumb Tacks: 2' "W" Arms: 10 X to V Arms: 10    Modalities Modalities: Moist Heat Manual Therapy Manual Therapy: Myofascial release Myofascial Release: MFR, manual stretching, and trigger point release to left scapular, trapezius, SCM, rhomboid, upper arm, flexor and extensor forearm. Gentle traction to left arm through full flexion and abduction arc. PROM to shoulder and elbow through all ranges Moist Heat Therapy Number Minutes Moist Heat: 15 Minutes Moist Heat Location: Shoulder  Occupational Therapy Assessment and Plan OT Assessment and Plan Clinical Impression  Statement: Continue to hold UBE, added 1# to supine. and wall wash OT Plan: Add seated AROM   Goals Short Term Goals Short Term Goal 1: Patient will be I with HEP. Short Term Goal 2: Patient will increase LUE PROM to [facility] for increased I at work. Short Term Goal 3: Patient will increase left shoulder strength to 4/5 for increased ability to lift binders at work. Short Term Goal 4: Patient will decrease pain in left shoulder and elbow to 3/10 during work activities. Short Term Goal 5: Patient will decrease fascial restrictions from max to mod. Long Term Goals Long Term Goal 1: Patient will return to prior level of I with all B/IADLs, work, and leisure activities. Long Term Goal 2: Patient will incorporate ergonomic recommendations offered by therapist for desk setup at work. Long Term Goal 3: Patient will increase LUE AROM to WNL for increased independence reaching overhead at work. Long Term Goal 4: Patient will increase LUE strength to 5/5 and increase left grip strength by 5 pounds for increased independence lifting binders at work. Long Term Goal 5: Patient will decrease pain level to 1/10 in her LUE. Additional Long Term Goals?: Yes Long Term Goal 6: Patient will decrease fascial restrictions to minimal in her LUE. End of Session Patient Active Problem List  Diagnoses  . BREAST CANCER  . HYPERLIPIDEMIA  . CAD  . GERD  . KNEE PAIN  . ANSERINE BURSITIS  . EDEMA  . CHEST PAIN  . HYPERTENSION, HX OF  . Pulmonary embolus  . CAD (coronary artery disease)  . Shoulder pain  . Neck pain  . Left tennis elbow  . Muscle weakness (generalized)   End of Session Activity Tolerance: Patient tolerated treatment well   Tayo Maute L. Noralee Stain, COTA/L  02/02/2011, 6:52 PM

## 2011-02-04 ENCOUNTER — Ambulatory Visit (HOSPITAL_COMMUNITY)
Admission: RE | Admit: 2011-02-04 | Discharge: 2011-02-04 | Disposition: A | Discharge status: Home / Self Care | Payer: Managed Care, Other (non HMO) | Source: Ambulatory Visit | Attending: Occupational Therapy | Admitting: Occupational Therapy

## 2011-02-04 DIAGNOSIS — M6281 Muscle weakness (generalized): Secondary | ICD-10-CM

## 2011-02-04 NOTE — Progress Notes (Signed)
Occupational Therapy Treatment  Patient Details  Name: [name] [name] MRN: [id number] Date of Birth: 18-Nov-1955  Today's Date: 02/04/2011 Time: 1478-2956 Visit#: 7  of 16   Re-eval: 02/09/11 Manual Therapy [phone]  Therapeutic Exercise [id number] [phone] 09 15DIAGNOSIS: Left Tennis Elbow, Rotator Cuff Syndrome, Cervicalgia  Referring MD: Dr. Romeo Apple   Exercise/Treatments Supine Protraction: PROM;Strengthening;12 reps Protraction Weight (lbs): 1# Horizontal ABduction: PROM;Strengthening;12 reps Horizontal ABduction Weight (lbs): 1# External Rotation: PROM;Strengthening;12 reps External Rotation Weight (lbs): 1# Internal Rotation: PROM;Strengthening;12 reps Internal Rotation Weight (lbs): 1# Flexion: PROM;Strengthening;12 reps Shoulder Flexion Weight (lbs): 1# ABduction: PROM;Strengthening;12 reps Shoulder ABduction Weight (lbs): 1# Seated Protraction: AROM;10 reps Horizontal ABduction: AROM;10 reps External Rotation: AROM;10 reps Internal Rotation: AROM;10 reps Flexion: AROM;10 reps Abduction: AROM;10 reps Therapy Ball Flexion: 20 reps ABduction: 20 reps Right/Left: 5 reps ROM / Strengthening / Isometric Strengthening Wall Wash: 3' Thumb Tacks: 2' "W" Arms: 10 X to V Arms: 10 Manual Therapy Manual Therapy: Myofascial release Myofascial Release: Manual Therapy [phone] 20'  Occupational Therapy Assessment and Plan OT Assessment and Plan Clinical Impression Statement: added seated AROM Rehab Potential: Good OT Plan: Increase reps with AROM ex.   Goals Short Term Goals Short Term Goal 1: Patient will be I with HEP. Short Term Goal 1 Progress: Progressing toward goal Short Term Goal 2: Patient will increase LUE PROM to [facility] for increased I at work. Short Term Goal 2 Progress: Met Short Term Goal 3: Patient will increase left shoulder strength to 4/5 for increased ability to lift binders at work. Short Term Goal 3 Progress: Progressing toward goal Short Term Goal 4:  Patient will decrease pain in left shoulder and elbow to 3/10 during work activities. Short Term Goal 4 Progress: Progressing toward goal Short Term Goal 5: Patient will decrease fascial restrictions from max to mod. Short Term Goal 5 Progress: Met Long Term Goals Long Term Goal 1: Patient will return to prior level of I with all B/IADLs, work, and leisure activities. Long Term Goal 1 Progress: Progressing toward goal Long Term Goal 2: Patient will incorporate ergonomic recommendations offered by therapist for desk setup at work. Long Term Goal 2 Progress: Progressing toward goal Long Term Goal 3: Patient will increase LUE AROM to WNL for increased independence reaching overhead at work. Long Term Goal 3 Progress: Met Long Term Goal 4: Patient will increase LUE strength to 5/5 and increase left grip strength by 5 pounds for increased independence lifting binders at work. Long Term Goal 4 Progress: Progressing toward goal Long Term Goal 5: Patient will decrease pain level to 1/10 in her LUE. Long Term Goal 5 Progress: Progressing toward goal Additional Long Term Goals?: Yes Long Term Goal 6: Patient will decrease fascial restrictions to minimal in her LUE. Long Term Goal 6 Progress: Progressing toward goal End of Session Patient Active Problem List  Diagnoses  . BREAST CANCER  . HYPERLIPIDEMIA  . CAD  . GERD  . KNEE PAIN  . ANSERINE BURSITIS  . EDEMA  . CHEST PAIN  . HYPERTENSION, HX OF  . Pulmonary embolus  . CAD (coronary artery disease)  . Shoulder pain  . Neck pain  . Left tennis elbow  . Muscle weakness (generalized)   End of Session Activity Tolerance: Patient tolerated treatment well General Behavior During Session: [facility] for tasks performed Cognition: [facility] for tasks performed   Noralee Stain, Prentis Langdon L 02/04/2011, 11:52 AM

## 2011-02-07 ENCOUNTER — Ambulatory Visit (HOSPITAL_COMMUNITY)
Admission: RE | Admit: 2011-02-07 | Discharge: 2011-02-07 | Disposition: A | Discharge status: Home / Self Care | Payer: Managed Care, Other (non HMO) | Source: Ambulatory Visit | Attending: Orthopedic Surgery | Admitting: Orthopedic Surgery

## 2011-02-07 DIAGNOSIS — M25519 Pain in unspecified shoulder: Secondary | ICD-10-CM | POA: Insufficient documentation

## 2011-02-07 DIAGNOSIS — M6281 Muscle weakness (generalized): Secondary | ICD-10-CM | POA: Insufficient documentation

## 2011-02-07 DIAGNOSIS — IMO0001 Reserved for inherently not codable concepts without codable children: Secondary | ICD-10-CM | POA: Insufficient documentation

## 2011-02-07 DIAGNOSIS — M25619 Stiffness of unspecified shoulder, not elsewhere classified: Secondary | ICD-10-CM | POA: Insufficient documentation

## 2011-02-07 DIAGNOSIS — E785 Hyperlipidemia, unspecified: Secondary | ICD-10-CM | POA: Insufficient documentation

## 2011-02-07 DIAGNOSIS — I10 Essential (primary) hypertension: Secondary | ICD-10-CM | POA: Insufficient documentation

## 2011-02-07 NOTE — Progress Notes (Signed)
Occupational Therapy Treatment  Patient Details  Name: [name] [name] MRN: [id number] Date of Birth: 13-Apr-1956  Today's Date: 02/07/2011 Time: 4696-2952 Time Calculation (min): 46 min Visit#: 7  of 16   Re-eval: 02/09/11 Manual Therapy  [phone] 18'  Therapeutic Exercise 530-558 28' 09 15DIAGNOSIS: Left Tennis Elbow, Rotator Cuff Syndrome, Cervicalgia  Referring MD: Dr. Romeo Apple     Subjective Symptoms/Limitations Symptoms: It has been a bad day, it has ached all day, I think because of the weather. Pain Assessment Currently in Pain?: Yes Pain Score:   2 Pain Location: Shoulder Pain Orientation: Left Pain Type: Acute pain   Exercise/Treatments Supine Protraction: PROM;Strengthening;15 reps Protraction Weight (lbs): 1# Horizontal ABduction: PROM;Strengthening;15 reps Horizontal ABduction Weight (lbs): 1# External Rotation: PROM;Strengthening;15 reps External Rotation Weight (lbs): 1# Internal Rotation: PROM;Strengthening;15 reps Internal Rotation Weight (lbs): 1# Flexion: PROM;Strengthening;15 reps Shoulder Flexion Weight (lbs): 1# ABduction: PROM;Strengthening;15 reps Shoulder ABduction Weight (lbs): 1# Seated Protraction: AROM;12 reps Horizontal ABduction: AROM;12 reps External Rotation: AROM;12 reps Internal Rotation: AROM;12 reps Flexion: AROM;12 reps Abduction: AROM;12 reps Therapy Ball Flexion: 20 reps ABduction: 20 reps Right/Left: 5 reps ROM / Strengthening / Isometric Strengthening UBE (Upper Arm Bike): d/c Wall Wash: 3' Thumb Tacks: 2' "W" Arms: 10 X to V Arms: 10  Manual Therapy Manual Therapy: Myofascial release Myofascial Release: MFR, manual stretching, and trigger point release to left scapular, trapezius, SCM, rhomboid, upper arm, flexor and extensor forearm. Gentle traction to left arm through full flexion and abduction arc. PROM to shoulder and elbow through all ranges   Occupational Therapy Assessment and Plan OT Assessment and  Plan Clinical Impression Statement: Decreased c/o pain in upper trap and arm. Rehab Potential: Good OT Plan: reassess.   Goals Short Term Goals Short Term Goal 1: Patient will be I with HEP. Short Term Goal 2: Patient will increase LUE PROM to [facility] for increased I at work. Short Term Goal 3: Patient will increase left shoulder strength to 4/5 for increased ability to lift binders at work. Short Term Goal 4: Patient will decrease pain in left shoulder and elbow to 3/10 during work activities. Short Term Goal 5: Patient will decrease fascial restrictions from max to mod. Long Term Goals Long Term Goal 1: Patient will return to prior level of I with all B/IADLs, work, and leisure activities. Long Term Goal 2: Patient will incorporate ergonomic recommendations offered by therapist for desk setup at work. Long Term Goal 3: Patient will increase LUE AROM to WNL for increased independence reaching overhead at work. Long Term Goal 4: Patient will increase LUE strength to 5/5 and increase left grip strength by 5 pounds for increased independence lifting binders at work. Long Term Goal 5: Patient will decrease pain level to 1/10 in her LUE. Additional Long Term Goals?: Yes Long Term Goal 6: Patient will decrease fascial restrictions to minimal in her LUE. End of Session Patient Active Problem List  Diagnoses  . BREAST CANCER  . HYPERLIPIDEMIA  . CAD  . GERD  . KNEE PAIN  . ANSERINE BURSITIS  . EDEMA  . CHEST PAIN  . HYPERTENSION, HX OF  . Pulmonary embolus  . CAD (coronary artery disease)  . Shoulder pain  . Neck pain  . Left tennis elbow  . Muscle weakness (generalized)   End of Session Activity Tolerance: Patient tolerated treatment well   Azlaan Isidore L. Noralee Stain, COTA/L  02/07/2011, 6:32 PM

## 2011-02-11 ENCOUNTER — Ambulatory Visit (HOSPITAL_COMMUNITY)
Admission: RE | Admit: 2011-02-11 | Discharge: 2011-02-11 | Disposition: A | Discharge status: Home / Self Care | Payer: Managed Care, Other (non HMO) | Source: Ambulatory Visit | Attending: Occupational Therapy | Admitting: Occupational Therapy

## 2011-02-11 DIAGNOSIS — M6281 Muscle weakness (generalized): Secondary | ICD-10-CM

## 2011-02-11 NOTE — Progress Notes (Signed)
Occupational Therapy Treatment  Patient Details  Name: [name] [name] MRN: [id number] Date of Birth: August 30, 1955  Today's Date: 02/11/2011 Time: 4098-1191 Time Calculation (min): 67 min Visit#: 8  of 16   Re-eval: 02/11/11 Manual Therapy [phone] 22' Therapeutic Exercise 513-542 Moist Heat [phone]  15' 09 15DIAGNOSIS: Left Tennis Elbow, Rotator Cuff Syndrome, Cervicalgia  Referring MD: Dr. Romeo Apple    Subjective Symptoms/Limitations Symptoms: It is more tight than anything. Pain Assessment Currently in Pain?: No/denies Pain Score: 0-No pain   Exercise/Treatments Supine Protraction: Strengthening;10 reps Protraction Weight (lbs): 2# Horizontal ABduction: Strengthening;10 reps Horizontal ABduction Weight (lbs): 2# External Rotation: Strengthening;10 reps External Rotation Weight (lbs): 2# Internal Rotation: Strengthening;10 reps Internal Rotation Weight (lbs): 2# Flexion: Strengthening;10 reps Shoulder Flexion Weight (lbs): 2# ABduction: Strengthening;10 reps Shoulder ABduction Weight (lbs): 2# Seated Protraction: Strengthening;10 reps Horizontal ABduction: Strengthening;10 reps External Rotation: Strengthening;10 reps Internal Rotation: Strengthening;10 reps Flexion: Strengthening;10 reps Abduction: Strengthening;10 reps   Therapy Ball Flexion: 20 reps ABduction: 20 reps Right/Left: 5 reps ROM / Strengthening / Isometric Strengthening Wall Wash: 4' Thumb Tacks: 2' "W" Arms: 10 X to V Arms: 10     Manual Therapy Manual Therapy: Myofascial release Myofascial Release: MFR, manual stretching, and trigger point release to left scapular, trapezius, SCM, rhomboid, upper arm, flexor and extensor forearm. Gentle traction to left arm through full flexion and abduction arc. PROM to shoulder and elbow through all ranges  Moist Heat Therapy Number Minutes Moist Heat: 15 Minutes Moist Heat Location: Shoulder  Occupational Therapy Assessment and Plan OT Assessment and  Plan Clinical Impression Statement: Increased supine to 2# and seated to 1# Rehab Potential: Good OT Plan: reassess   Goals Short Term Goals Short Term Goal 1: Patient will be I with HEP. Short Term Goal 2: Patient will increase LUE PROM to [facility] for increased I at work. Short Term Goal 3: Patient will increase left shoulder strength to 4/5 for increased ability to lift binders at work. Short Term Goal 4: Patient will decrease pain in left shoulder and elbow to 3/10 during work activities. Short Term Goal 5: Patient will decrease fascial restrictions from max to mod. Long Term Goals Long Term Goal 1: Patient will return to prior level of I with all B/IADLs, work, and leisure activities. Long Term Goal 2: Patient will incorporate ergonomic recommendations offered by therapist for desk setup at work. Long Term Goal 3: Patient will increase LUE AROM to WNL for increased independence reaching overhead at work. Long Term Goal 4: Patient will increase LUE strength to 5/5 and increase left grip strength by 5 pounds for increased independence lifting binders at work. Long Term Goal 5: Patient will decrease pain level to 1/10 in her LUE. Additional Long Term Goals?: Yes Long Term Goal 6: Patient will decrease fascial restrictions to minimal in her LUE. End of Session Patient Active Problem List  Diagnoses  . BREAST CANCER  . HYPERLIPIDEMIA  . CAD  . GERD  . KNEE PAIN  . ANSERINE BURSITIS  . EDEMA  . CHEST PAIN  . HYPERTENSION, HX OF  . Pulmonary embolus  . CAD (coronary artery disease)  . Shoulder pain  . Neck pain  . Left tennis elbow  . Muscle weakness (generalized)   End of Session Activity Tolerance: Patient tolerated treatment well   Roston Grunewald L. Noralee Stain, COTA/L  02/11/2011, 6:12 PM

## 2011-02-14 ENCOUNTER — Ambulatory Visit (HOSPITAL_COMMUNITY)
Admission: RE | Admit: 2011-02-14 | Discharge: 2011-02-14 | Disposition: A | Discharge status: Home / Self Care | Payer: Managed Care, Other (non HMO) | Source: Ambulatory Visit | Attending: Occupational Therapy | Admitting: Occupational Therapy

## 2011-02-14 DIAGNOSIS — M6281 Muscle weakness (generalized): Secondary | ICD-10-CM

## 2011-02-14 NOTE — Progress Notes (Signed)
Occupational Therapy Treatment  Patient Details  Name: [name] [name] MRN: [id number] Date of Birth: 02-10-1956  Today's Date: 02/14/2011 Time: 7829-5621 Time Calculation (min): 45 min Visit#: 9  of 16   Re-eval: 03/14/11 Manuel Therapy  [phone]  84' Therapeutic Exercise [phone] 26'    Subjective Symptoms/Limitations Symptoms: S:  I don't know what you did friday but it sure feels better. Pain Assessment Pain Score: 0-No pain  Exercise/Treatments Supine Protraction: Strengthening;12 reps Protraction Weight (lbs): 2# Horizontal ABduction: Strengthening;12 reps Horizontal ABduction Weight (lbs): 2# External Rotation: Strengthening;12 reps External Rotation Weight (lbs): 2# Internal Rotation: Strengthening;12 reps Internal Rotation Weight (lbs): 2# Flexion: Strengthening;12 reps Shoulder Flexion Weight (lbs): 2# ABduction: Strengthening;12 reps Shoulder ABduction Weight (lbs): 2# Seated Protraction: Strengthening;10 reps Protraction Weight (lbs): 1# Horizontal ABduction: Strengthening;10 reps Horizontal ABduction Weight (lbs): 1# External Rotation: Strengthening;10 reps External Rotation Weight (lbs): 1# Internal Rotation: Strengthening;10 reps Internal Rotation Weight (lbs): 1# Flexion: Strengthening;10 reps Flexion Weight (lbs): 1# Abduction: Strengthening;10 reps ABduction Weight (lbs): 1# Therapy Ball Flexion: 20 reps ABduction: 20 reps Right/Left: 5 reps ROM / Strengthening / Isometric Strengthening Wall Wash: 4' Thumb Tacks: 2' "W" Arms: 10 X to V Arms: 10    Manual Therapy Manual Therapy: Myofascial release Myofascial Release: MFR, manual stretching, and trigger point release to left scapular, trapezius, SCM, rhomboid, upper arm, flexor and extensor forearm. Gentle traction to left arm through full flexion and abduction arc. PROM to shoulder and elbow through all ranges   Occupational Therapy Assessment and Plan OT Assessment and Plan Clinical  Impression Statement: A:  See progress note. OT Plan: P:  Continue 2x week x 4 weeks   Goals Short Term Goals Short Term Goal 1: Patient will be I with HEP. Short Term Goal 1 Progress: Met Short Term Goal 2: Patient will increase LUE PROM to [facility] for increased I at work. Short Term Goal 2 Progress: Met Short Term Goal 3: Patient will increase left shoulder strength to 4/5 for increased ability to lift binders at work. Short Term Goal 3 Progress: Met Short Term Goal 4: Patient will decrease pain in left shoulder and elbow to 3/10 during work activities. Short Term Goal 4 Progress: Met Short Term Goal 5: Patient will decrease fascial restrictions from max to mod. Short Term Goal 5 Progress: Met Long Term Goals Long Term Goal 1: Patient will return to prior level of I with all B/IADLs, work, and leisure activities. Long Term Goal 1 Progress: Progressing toward goal Long Term Goal 2: Patient will incorporate ergonomic recommendations offered by therapist for desk setup at work. Long Term Goal 2 Progress: Progressing toward goal Long Term Goal 3: Patient will increase LUE AROM to WNL for increased independence reaching overhead at work. Long Term Goal 3 Progress: Met Long Term Goal 4: Patient will increase LUE strength to 5/5 and increase left grip strength by 5 pounds for increased independence lifting binders at work. Long Term Goal 4 Progress: Progressing toward goal Long Term Goal 5: Patient will decrease pain level to 1/10 in her LUE. Long Term Goal 5 Progress: Progressing toward goal Additional Long Term Goals?: Yes Long Term Goal 6: Patient will decrease fascial restrictions to minimal in her LUE. Long Term Goal 6 Progress: Met End of Session Patient Active Problem List  Diagnoses  . BREAST CANCER  . HYPERLIPIDEMIA  . CAD  . GERD  . KNEE PAIN  . ANSERINE BURSITIS  . EDEMA  . CHEST PAIN  . HYPERTENSION, HX OF  .  Pulmonary embolus  . CAD (coronary artery disease)  . Shoulder  pain  . Neck pain  . Left tennis elbow  . Muscle weakness (generalized)   End of Session Activity Tolerance: Patient tolerated treatment well General Behavior During Session: [facility] for tasks performed Cognition: [facility] for tasks performed   Lulabelle Desta L. Laakea Pereira, COTA/L  02/14/2011, 6:40 PM

## 2011-02-16 ENCOUNTER — Ambulatory Visit (HOSPITAL_COMMUNITY)
Admission: RE | Admit: 2011-02-16 | Discharge: 2011-02-16 | Disposition: A | Discharge status: Home / Self Care | Payer: Managed Care, Other (non HMO) | Source: Ambulatory Visit | Attending: Pulmonary Disease | Admitting: Pulmonary Disease

## 2011-02-16 DIAGNOSIS — M6281 Muscle weakness (generalized): Secondary | ICD-10-CM

## 2011-02-16 NOTE — Progress Notes (Signed)
Occupational Therapy Treatment  Patient Details  Name: [name] [name] MRN: [id number] Date of Birth: 1955/05/27  Today's Date: 02/16/2011 Time: 2130-8657 Time Calculation (min): 41 min Visit#: 10  of 16   Re-eval: 03/14/11 [facility] [phone]  95' Therapeutic Exercise [phone]  22'  Subjective Symptoms/Limitations Symptoms: S:  It is feeling ok, I havn't taken anything today.  It gets better till do something stupid like trying to turn on a light switch. Pain Assessment Currently in Pain?: No/denies Pain Score: 0-No pain  Exercise/Treatments Supine Protraction: PROM;10 reps;Strengthening;15 reps Protraction Weight (lbs): 2# Horizontal ABduction: PROM;10 reps;Strengthening;15 reps Horizontal ABduction Weight (lbs): 2# External Rotation: PROM;10 reps;Strengthening;15 reps External Rotation Weight (lbs): 2# Internal Rotation: PROM;10 reps;Strengthening;15 reps Internal Rotation Weight (lbs): 2# Flexion: PROM;10 reps;Strengthening;15 reps Shoulder Flexion Weight (lbs): 2# ABduction: PROM;10 reps;Strengthening;15 reps Shoulder ABduction Weight (lbs): 2# Seated Protraction: Strengthening;12 reps Protraction Weight (lbs): 1# Horizontal ABduction: Strengthening;12 reps Horizontal ABduction Weight (lbs): 1# External Rotation: Strengthening;12 reps External Rotation Weight (lbs): 1# Internal Rotation: Strengthening;12 reps Internal Rotation Weight (lbs): 1# Flexion: Strengthening;12 reps Flexion Weight (lbs): 1# Abduction: Strengthening;12 reps ABduction Weight (lbs): 1# Therapy Ball Flexion: 20 reps ABduction: 20 reps Right/Left: 5 reps ROM / Strengthening / Isometric Strengthening Cybex Press: 1 plate;15 reps Cybex Row: 1 plate;15 reps Wall Wash: 3' with 1# Thumb Tacks: 2' "W" Arms: x10 with 1# X to V Arms: x10 with 1#  Manual Therapy Manual Therapy: Myofascial release Myofascial Release: MFR, manual stretching, and trigger point release to left scapular,  trapezius, SCM, rhomboid, upper arm, flexor and extensor forearm. Gentle traction to left arm through full flexion and abduction arc. PROM to shoulder and elbow through all ranges  Occupational Therapy Assessment and Plan OT Assessment and Plan Clinical Impression Statement: A:  Added 1# weight to wall wash and cybex press and row. Rehab Potential: Good OT Plan: P:  Increase reps with cybex.   Goals Short Term Goals Short Term Goal 1: Patient will be I with HEP. Short Term Goal 2: Patient will increase LUE PROM to [facility] for increased I at work. Short Term Goal 3: Patient will increase left shoulder strength to 4/5 for increased ability to lift binders at work. Short Term Goal 4: Patient will decrease pain in left shoulder and elbow to 3/10 during work activities. Short Term Goal 5: Patient will decrease fascial restrictions from max to mod. Long Term Goals Long Term Goal 1: Patient will return to prior level of I with all B/IADLs, work, and leisure activities. Long Term Goal 2: Patient will incorporate ergonomic recommendations offered by therapist for desk setup at work. Long Term Goal 3: Patient will increase LUE AROM to WNL for increased independence reaching overhead at work. Long Term Goal 4: Patient will increase LUE strength to 5/5 and increase left grip strength by 5 pounds for increased independence lifting binders at work. Long Term Goal 5: Patient will decrease pain level to 1/10 in her LUE. Additional Long Term Goals?: Yes Long Term Goal 6: Patient will decrease fascial restrictions to minimal in her LUE. End of Session Patient Active Problem List  Diagnoses  . BREAST CANCER  . HYPERLIPIDEMIA  . CAD  . GERD  . KNEE PAIN  . ANSERINE BURSITIS  . EDEMA  . CHEST PAIN  . HYPERTENSION, HX OF  . Pulmonary embolus  . CAD (coronary artery disease)  . Shoulder pain  . Neck pain  . Left tennis elbow  . Muscle weakness (generalized)   End  of Session Activity Tolerance:  Patient tolerated treatment well General Behavior During Session: [facility] for tasks performed Cognition: [facility] for tasks performed   Azaela Caracci L. Deontre Allsup, COTA/L  02/16/2011, 5:32 PM

## 2011-02-21 ENCOUNTER — Ambulatory Visit (HOSPITAL_COMMUNITY)
Admission: RE | Admit: 2011-02-21 | Discharge: 2011-02-21 | Disposition: A | Discharge status: Home / Self Care | Payer: Managed Care, Other (non HMO) | Source: Ambulatory Visit | Attending: Occupational Therapy | Admitting: Occupational Therapy

## 2011-02-21 DIAGNOSIS — M6281 Muscle weakness (generalized): Secondary | ICD-10-CM

## 2011-02-21 NOTE — Progress Notes (Signed)
Occupational Therapy Treatment  Patient Details  Name: [name] [name] MRN: [id number] Date of Birth: 1955-11-17  Today's Date: 02/21/2011 Time: 7829-5621 Time Calculation (min): 40 min Visit#: 11  of 16   Re-eval: 03/14/11 [facility]  [phone]  84' Therapeutic Exercise  [phone] 20'  Subjective Symptoms/Limitations Symptoms: S:  I have knots all under my arm, it just feels like it needs to be stretched. Pain Assessment Currently in Pain?: Yes Pain Score:   2 Pain Location: Arm Pain Orientation: Left Pain Type: Acute pain   Exercise/Treatments Supine Protraction: PROM;10 reps;Strengthening;15 reps Protraction Weight (lbs): 2# Horizontal ABduction: PROM;10 reps;Strengthening;15 reps Horizontal ABduction Weight (lbs): 2# External Rotation: PROM;10 reps;Strengthening;15 reps External Rotation Weight (lbs): 2# Internal Rotation: PROM;10 reps;Strengthening;15 reps Internal Rotation Weight (lbs): 2# Flexion: PROM;10 reps;Strengthening;15 reps Shoulder Flexion Weight (lbs): 2# ABduction: PROM;10 reps;Strengthening;15 reps Shoulder ABduction Weight (lbs): 2# Seated Protraction: Strengthening;10 reps Protraction Weight (lbs): 2# Horizontal ABduction: Strengthening;10 reps Horizontal ABduction Weight (lbs): 2# External Rotation: Strengthening;10 reps External Rotation Weight (lbs): 2# Internal Rotation: Strengthening;10 reps Internal Rotation Weight (lbs): 2# Flexion: Strengthening;10 reps Flexion Weight (lbs): 2# Abduction: Strengthening;10 reps ABduction Weight (lbs): 2# Therapy Ball Flexion: 20 reps ABduction: 20 reps Right/Left: 5 reps ROM / Strengthening / Isometric Strengthening Cybex Press: 1 plate;15 reps Cybex Row: 1 plate;15 reps Wall Wash: 3' with 1# Thumb Tacks: 2' "W" Arms: x10 with 1# X to V Arms: x10 with 1#    Manual Therapy Manual Therapy: Myofascial release Myofascial Release: MFR, manual stretching, and trigger point release to left scapular,  trapezius, SCM, rhomboid, upper arm, flexor and extensor forearm. Gentle traction to left arm through full flexion and abduction arc. PROM to shoulder and elbow through all ranges   Occupational Therapy Assessment and Plan OT Assessment and Plan Clinical Impression Statement: A:  Increased 2# to seated exercises. Rehab Potential: Good OT Plan: P:  Add tband.   Goals Short Term Goals Short Term Goal 1: Patient will be I with HEP. Short Term Goal 2: Patient will increase LUE PROM to [facility] for increased I at work. Short Term Goal 3: Patient will increase left shoulder strength to 4/5 for increased ability to lift binders at work. Short Term Goal 4: Patient will decrease pain in left shoulder and elbow to 3/10 during work activities. Short Term Goal 5: Patient will decrease fascial restrictions from max to mod. Long Term Goals Long Term Goal 1: Patient will return to prior level of I with all B/IADLs, work, and leisure activities. Long Term Goal 2: Patient will incorporate ergonomic recommendations offered by therapist for desk setup at work. Long Term Goal 3: Patient will increase LUE AROM to WNL for increased independence reaching overhead at work. Long Term Goal 4: Patient will increase LUE strength to 5/5 and increase left grip strength by 5 pounds for increased independence lifting binders at work. Long Term Goal 5: Patient will decrease pain level to 1/10 in her LUE. Additional Long Term Goals?: Yes Long Term Goal 6: Patient will decrease fascial restrictions to minimal in her LUE. End of Session Patient Active Problem List  Diagnoses  . BREAST CANCER  . HYPERLIPIDEMIA  . CAD  . GERD  . KNEE PAIN  . ANSERINE BURSITIS  . EDEMA  . CHEST PAIN  . HYPERTENSION, HX OF  . Pulmonary embolus  . CAD (coronary artery disease)  . Shoulder pain  . Neck pain  . Left tennis elbow  . Muscle weakness (generalized)   End of  Session Activity Tolerance: Patient tolerated treatment  well General Behavior During Session: [facility] for tasks performed Cognition: [facility] for tasks performed   Marylouise Mallet L. Glenys Snader, COTA/L  02/21/2011, 5:40 PM

## 2011-02-23 ENCOUNTER — Ambulatory Visit (HOSPITAL_COMMUNITY)
Admission: RE | Admit: 2011-02-23 | Discharge: 2011-02-23 | Disposition: A | Discharge status: Home / Self Care | Payer: Managed Care, Other (non HMO) | Source: Ambulatory Visit | Attending: Pulmonary Disease | Admitting: Pulmonary Disease

## 2011-02-23 LAB — COMPREHENSIVE METABOLIC PANEL
ALT: 26
Albumin: 3.8
Alkaline Phosphatase: 65
BUN: 20
Chloride: 105
Glucose, Bld: 118 — ABNORMAL HIGH
Potassium: 4.1
Sodium: 139
Total Bilirubin: 0.4
Total Protein: 5.9 — ABNORMAL LOW

## 2011-02-23 LAB — CBC
HCT: 38
Hemoglobin: 13
Platelets: 339
WBC: 4.2

## 2011-02-23 LAB — DIFFERENTIAL
Basophils Absolute: 0
Basophils Relative: 1
Eosinophils Absolute: 0.1
Eosinophils Relative: 3
Monocytes Absolute: 0.3
Monocytes Relative: 8
Neutro Abs: 2.1

## 2011-02-23 LAB — IRON AND TIBC: Saturation Ratios: 21

## 2011-02-23 NOTE — Progress Notes (Signed)
Occupational Therapy Treatment  Patient Details  Name: [name] [name] MRN: [id number] Date of Birth: 26-Dec-1955  Today's Date: 02/23/2011 Time: 1308-6578 Time Calculation (min): 34 min Visit#: 12  of 16   Re-eval: 03/14/11 [facility] [phone]   Subjective Symptoms/Limitations Symptoms: S:  This thing woke me up at 3:30.  It hurt so bad I wanted to cry.  It is better now. Pain Assessment Currently in Pain?: Yes Pain Score:   4 Pain Location: Arm Pain Orientation: Left Pain Type: Acute pain  Exercise/Treatments Hold on exercises today   Manual Therapy Manual Therapy: Myofascial release Myofascial Release: MFR, manual stretching, and trigger point release to left scapular, trapezius, SCM, rhomboid, upper arm, flexor and extensor forearm. Gentle traction to left arm through full flexion and abduction arc. PROM to shoulder and elbow through all ranges   Occupational Therapy Assessment and Plan OT Assessment and Plan Clinical Impression Statement: A:  Hold on exercises today secondary to increased pain and discomfort.  Pt's pain decreased to 2/10 after manuel.  Patient to heat at home. Rehab Potential: Good OT Plan: P:  Resume exercises and add tband   Goals Short Term Goals Short Term Goal 1: Patient will be I with HEP. Short Term Goal 2: Patient will increase LUE PROM to [facility] for increased I at work. Short Term Goal 3: Patient will increase left shoulder strength to 4/5 for increased ability to lift binders at work. Short Term Goal 4: Patient will decrease pain in left shoulder and elbow to 3/10 during work activities. Short Term Goal 5: Patient will decrease fascial restrictions from max to mod. Long Term Goals Long Term Goal 1: Patient will return to prior level of I with all B/IADLs, work, and leisure activities. Long Term Goal 2: Patient will incorporate ergonomic recommendations offered by therapist for desk setup at work. Long Term Goal 3: Patient will increase LUE  AROM to WNL for increased independence reaching overhead at work. Long Term Goal 4: Patient will increase LUE strength to 5/5 and increase left grip strength by 5 pounds for increased independence lifting binders at work. Long Term Goal 5: Patient will decrease pain level to 1/10 in her LUE. Additional Long Term Goals?: Yes Long Term Goal 6: Patient will decrease fascial restrictions to minimal in her LUE. End of Session Patient Active Problem List  Diagnoses  . BREAST CANCER  . HYPERLIPIDEMIA  . CAD  . GERD  . KNEE PAIN  . ANSERINE BURSITIS  . EDEMA  . CHEST PAIN  . HYPERTENSION, HX OF  . Pulmonary embolus  . CAD (coronary artery disease)  . Shoulder pain  . Neck pain  . Left tennis elbow  . Muscle weakness (generalized)   End of Session Activity Tolerance: Patient tolerated treatment well General Cognition: [facility] for tasks performed   Kourtland Coopman L. Vikash Nest, COTA/L  02/23/2011, 5:50 PM

## 2011-03-01 ENCOUNTER — Ambulatory Visit (HOSPITAL_COMMUNITY)
Admission: RE | Admit: 2011-03-01 | Discharge: 2011-03-01 | Disposition: A | Discharge status: Home / Self Care | Payer: Managed Care, Other (non HMO) | Source: Ambulatory Visit | Attending: Pulmonary Disease | Admitting: Pulmonary Disease

## 2011-03-01 DIAGNOSIS — M6281 Muscle weakness (generalized): Secondary | ICD-10-CM

## 2011-03-01 NOTE — Progress Notes (Signed)
Occupational Therapy Treatment  Patient Details  Name: YTZEL GUBLER MRN: [id number] Date of Birth: [birth year]  Today's Date: 03/01/2011 Time: 8413-2440  Manual Therapy [phone] 16'  Therapeutic Exercises [phone] 20' Visit#: 13  of 16   Re-eval: 03/14/11    Subjective Symptoms/Limitations Symptoms: S:  My neck is more sore than anything right now. Pain Assessment Currently in Pain?: Yes Pain Score:   2 Pain Location: Shoulder Pain Orientation: Left Pain Type: Acute pain   Exercise/Treatments  03/01/11 0700 Shoulder Exercises: Supine Protraction PROM;10 reps;Strengthening;15 reps Protraction Weight (lbs) 2# Horizontal ABduction PROM;10 reps;Strengthening;15 reps Horizontal ABduction Weight (lbs) 2# External Rotation PROM;10 reps;Strengthening;15 reps External Rotation Weight (lbs) 2# Internal Rotation PROM;10 reps;Strengthening;15 reps Internal Rotation Weight (lbs) 2# Flexion PROM;10 reps;Strengthening;15 reps Shoulder Flexion Weight (lbs) 2# ABduction PROM;10 reps;Strengthening;15 reps Shoulder ABduction Weight (lbs) 2# Shoulder Exercises: Seated Protraction Strengthening;12 reps Protraction Weight (lbs) 2# Horizontal ABduction Strengthening;12 reps Horizontal ABduction Weight (lbs) 2# External Rotation Strengthening;12 reps External Rotation Weight (lbs) 2# Internal Rotation Strengthening;12 reps Internal Rotation Weight (lbs) 2# Flexion Strengthening;12 reps Flexion Weight (lbs) 2# Abduction Strengthening;12 reps ABduction Weight (lbs) 2# Shoulder Exercises: Therapy Ball Flexion (dc) ABduction (dc) Right/Left (dc) Shoulder Exercises: ROM/Strengthening UBE (Upper Arm Bike) d/c Cybex Press 1.5 plate;10 reps Cybex Row 1.5 plate;10 reps Wall Wash 4' with 2# Thumb Tacks dc "W" Arms 10 with 2 pounds X to V Arms 10 with 2 pounds  Manual Therapy Manual Therapy: Myofascial release Myofascial Release: MFR, manual stretching and trigger point release to  left scapular, trapezius, SCM, rhomboid, upper arm and gentle traction to left upper extremity [id number]  Occupational Therapy Assessment and Plan OT Assessment and Plan Clinical Impression Statement: A:  Increased reps with all strengthening exercises.  DC therapy ball exercises and thumbtack exercises. OT Plan: P:  Increase to 3# with supine PRE.   Goals Short Term Goals Short Term Goal 1: Patient will be I with HEP. Short Term Goal 1 Progress: Met Short Term Goal 2: Patient will increase LUE PROM to [facility] for increased I at work. Short Term Goal 2 Progress: Met Short Term Goal 3: Patient will increase left shoulder strength to 4/5 for increased ability to lift binders at work. Short Term Goal 3 Progress: Met Short Term Goal 4: Patient will decrease pain in left shoulder and elbow to 3/10 during work activities. Short Term Goal 4 Progress: Met Short Term Goal 5: Patient will decrease fascial restrictions from max to mod. Short Term Goal 5 Progress: Met Long Term Goals Long Term Goal 1: Patient will return to prior level of I with all B/IADLs, work, and leisure activities. Long Term Goal 1 Progress: Progressing toward goal Long Term Goal 2: Patient will incorporate ergonomic recommendations offered by therapist for desk setup at work. Long Term Goal 2 Progress: Progressing toward goal Long Term Goal 3: Patient will increase LUE AROM to WNL for increased independence reaching overhead at work. Long Term Goal 3 Progress: Met Long Term Goal 4: Patient will increase LUE strength to 5/5 and increase left grip strength by 5 pounds for increased independence lifting binders at work. Long Term Goal 4 Progress: Progressing toward goal Long Term Goal 5: Patient will decrease pain level to 1/10 in her LUE. Long Term Goal 5 Progress: Progressing toward goal Additional Long Term Goals?: Yes Long Term Goal 6: Patient will decrease fascial restrictions to minimal in her LUE. End of Session Patient  Active Problem List  Diagnoses  . BREAST CANCER  .  HYPERLIPIDEMIA  . CAD  . GERD  . KNEE PAIN  . ANSERINE BURSITIS  . EDEMA  . CHEST PAIN  . HYPERTENSION, HX OF  . Pulmonary embolus  . CAD (coronary artery disease)  . Shoulder pain  . Neck pain  . Left tennis elbow  . Muscle weakness (generalized)   End of Session Activity Tolerance: Patient tolerated treatment well General Behavior During Session: [facility] for tasks performed Cognition: [facility] for tasks performed   Shirlean Mylar, OTR/L  03/01/2011, 4:51 PM

## 2011-03-04 ENCOUNTER — Ambulatory Visit (HOSPITAL_COMMUNITY)
Admission: RE | Admit: 2011-03-04 | Discharge: 2011-03-04 | Disposition: A | Discharge status: Home / Self Care | Payer: Managed Care, Other (non HMO) | Source: Ambulatory Visit | Attending: Pulmonary Disease | Admitting: Pulmonary Disease

## 2011-03-04 DIAGNOSIS — M6281 Muscle weakness (generalized): Secondary | ICD-10-CM

## 2011-03-04 NOTE — Progress Notes (Deleted)
Occupational Therapy Treatment  Patient Details  Name: [name] [name] MRN: [id number] Date of Birth: 1956/02/16  Today's Date: 03/04/2011 Time: 0865-7846 Time Calculation (min): 65 min Visit#: 14  of 16   Re-eval: 03/14/11 [facility]  [phone]  24' Therapeutic Exercise [phone] 35'  Subjective Symptoms/Limitations Symptoms: S:  I am tired, have been taking care of my mom and dad all day. Pain Assessment Currently in Pain?: Yes Pain Score:   2 Pain Location: Shoulder Pain Orientation: Left Pain Type: Acute pain  Exercise/Treatments Supine Protraction: PROM;Strengthening;10 reps Protraction Weight (lbs): 3# Horizontal ABduction: PROM;Strengthening;10 reps Horizontal ABduction Weight (lbs): 3# External Rotation: PROM;Strengthening;10 reps External Rotation Weight (lbs): 3# Internal Rotation: PROM;Strengthening;10 reps Internal Rotation Weight (lbs): 3# Flexion: PROM;Strengthening;10 reps Shoulder Flexion Weight (lbs): 3# ABduction: PROM;Strengthening;10 reps Shoulder ABduction Weight (lbs): 3# Seated Protraction: Strengthening;15 reps Protraction Weight (lbs): 2# Horizontal ABduction: Strengthening;15 reps Horizontal ABduction Weight (lbs): 2# External Rotation: Strengthening;15 reps External Rotation Weight (lbs): 2# Internal Rotation: Strengthening;15 reps Internal Rotation Weight (lbs): 2# Flexion: Strengthening;15 reps Flexion Weight (lbs): 2# Abduction: Strengthening;15 reps ABduction Weight (lbs): 2# ROM / Strengthening / Isometric Strengthening Cybex Press: 2 plate;15 reps Cybex Row: 2 plate;15 reps Wall Wash: 4' with 2# "W" Arms: 10 with 2 pounds X to V Arms: 10 with 2 pounds    Manual Therapy Manual Therapy: Myofascial release Myofascial Release: MFR, manual stretching, and trigger point release to left scapular, trapezius, SCM, rhomboid, upper arm, flexor and extensor forearm. Gentle traction to left arm through full flexion and abduction arc. PROM to  shoulder and elbow through all ranges   Occupational Therapy Assessment and Plan OT Assessment and Plan Clinical Impression Statement: A:  Increased supine to 3#. Rehab Potential: Good OT Plan: P:  reassess next week   Goals Short Term Goals Short Term Goal 1: Patient will be I with HEP. Short Term Goal 2: Patient will increase LUE PROM to [facility] for increased I at work. Short Term Goal 3: Patient will increase left shoulder strength to 4/5 for increased ability to lift binders at work. Short Term Goal 4: Patient will decrease pain in left shoulder and elbow to 3/10 during work activities. Short Term Goal 5: Patient will decrease fascial restrictions from max to mod. Long Term Goals Long Term Goal 1: Patient will return to prior level of I with all B/IADLs, work, and leisure activities. Long Term Goal 2: Patient will incorporate ergonomic recommendations offered by therapist for desk setup at work. Long Term Goal 3: Patient will increase LUE AROM to WNL for increased independence reaching overhead at work. Long Term Goal 4: Patient will increase LUE strength to 5/5 and increase left grip strength by 5 pounds for increased independence lifting binders at work. Long Term Goal 5: Patient will decrease pain level to 1/10 in her LUE. Additional Long Term Goals?: Yes Long Term Goal 6: Patient will decrease fascial restrictions to minimal in her LUE. End of Session Patient Active Problem List  Diagnoses  . BREAST CANCER  . HYPERLIPIDEMIA  . CAD  . GERD  . KNEE PAIN  . ANSERINE BURSITIS  . EDEMA  . CHEST PAIN  . HYPERTENSION, HX OF  . Pulmonary embolus  . CAD (coronary artery disease)  . Shoulder pain  . Neck pain  . Left tennis elbow  . Muscle weakness (generalized)   End of Session Activity Tolerance: Patient tolerated treatment well General Behavior During Session: [facility] for tasks performed Cognition: [facility] for tasks performed  Aydian Dimmick L. Urias Sheek, COTA/L  03/04/2011, 5:57 PM

## 2011-03-04 NOTE — Progress Notes (Signed)
Occupational Therapy Treatment  Patient Details  Name: [name] [name] MRN: [id number] Date of Birth: [birth year]  Today's Date: 03/04/2011 Time: 4098-1191 Time Calculation (min): 53 min Visit#: 14  of 16   Re-eval: 03/14/11 [facility]  [phone] 26' Therapeutic Exercise [phone]  26'    Subjective Symptoms/Limitations Symptoms: S:  I am tired, have been taking care of my mom and dad all day. Pain Assessment Currently in Pain?: Yes Pain Score:   2 Pain Location: Shoulder Pain Orientation: Left Pain Type: Acute pain   Exercise/Treatments Supine Protraction: PROM;Strengthening;10 reps Protraction Weight (lbs): 3# Horizontal ABduction: PROM;Strengthening;10 reps Horizontal ABduction Weight (lbs): 3# External Rotation: PROM;Strengthening;10 reps External Rotation Weight (lbs): 3# Internal Rotation: PROM;Strengthening;10 reps Internal Rotation Weight (lbs): 3# Flexion: PROM;Strengthening;10 reps Shoulder Flexion Weight (lbs): 3# ABduction: PROM;Strengthening;10 reps Shoulder ABduction Weight (lbs): 3# Seated Protraction: Strengthening;15 reps Protraction Weight (lbs): 2# Horizontal ABduction: Strengthening;15 reps Horizontal ABduction Weight (lbs): 2# External Rotation: Strengthening;15 reps External Rotation Weight (lbs): 2# Internal Rotation: Strengthening;15 reps Internal Rotation Weight (lbs): 2# Flexion: Strengthening;15 reps Flexion Weight (lbs): 2# Abduction: Strengthening;15 reps ABduction Weight (lbs): 2# ROM / Strengthening / Isometric Strengthening Cybex Press: 2 plate;15 reps Cybex Row: 2 plate;15 reps Wall Wash: 4' with 2# "W" Arms: 10 with 2 pounds X to V Arms: 10 with 2 pounds  Manual Therapy Manual Therapy: Myofascial release Myofascial Release: MFR, manual stretching, and trigger point release to left scapular, trapezius, SCM, rhomboid, upper arm, flexor and extensor forearm. Gentle traction to left arm through full flexion and abduction arc. PROM  to shoulder and elbow through all ranges   Occupational Therapy Assessment and Plan OT Assessment and Plan Clinical Impression Statement: A:  Increased supine to 3#. Rehab Potential: Good OT Plan: P:  reassess next week   Goals Short Term Goals Short Term Goal 1: Patient will be I with HEP. Short Term Goal 2: Patient will increase LUE PROM to [facility] for increased I at work. Short Term Goal 3: Patient will increase left shoulder strength to 4/5 for increased ability to lift binders at work. Short Term Goal 4: Patient will decrease pain in left shoulder and elbow to 3/10 during work activities. Short Term Goal 5: Patient will decrease fascial restrictions from max to mod. Long Term Goals Long Term Goal 1: Patient will return to prior level of I with all B/IADLs, work, and leisure activities. Long Term Goal 2: Patient will incorporate ergonomic recommendations offered by therapist for desk setup at work. Long Term Goal 3: Patient will increase LUE AROM to WNL for increased independence reaching overhead at work. Long Term Goal 4: Patient will increase LUE strength to 5/5 and increase left grip strength by 5 pounds for increased independence lifting binders at work. Long Term Goal 5: Patient will decrease pain level to 1/10 in her LUE. Additional Long Term Goals?: Yes Long Term Goal 6: Patient will decrease fascial restrictions to minimal in her LUE. End of Session Patient Active Problem List  Diagnoses  . BREAST CANCER  . HYPERLIPIDEMIA  . CAD  . GERD  . KNEE PAIN  . ANSERINE BURSITIS  . EDEMA  . CHEST PAIN  . HYPERTENSION, HX OF  . Pulmonary embolus  . CAD (coronary artery disease)  . Shoulder pain  . Neck pain  . Left tennis elbow  . Muscle weakness (generalized)   End of Session Activity Tolerance: Patient tolerated treatment well General Behavior During Session: [facility] for tasks performed Cognition: [facility] for tasks performed  Archibald Marchetta L. Noralee Stain, COTA/L  03/04/2011, 6:14  PM

## 2011-03-08 ENCOUNTER — Ambulatory Visit (INDEPENDENT_AMBULATORY_CARE_PROVIDER_SITE_OTHER): Payer: Managed Care, Other (non HMO) | Admitting: Orthopedic Surgery

## 2011-03-08 ENCOUNTER — Encounter: Payer: Self-pay | Admitting: Orthopedic Surgery

## 2011-03-08 VITALS — BP 140/80 | Ht 64.0 in | Wt 241.0 lb

## 2011-03-08 DIAGNOSIS — M25519 Pain in unspecified shoulder: Secondary | ICD-10-CM

## 2011-03-08 NOTE — Patient Instructions (Signed)
Finish PT  

## 2011-03-09 ENCOUNTER — Ambulatory Visit (HOSPITAL_COMMUNITY)
Admission: RE | Admit: 2011-03-09 | Discharge: 2011-03-09 | Disposition: A | Discharge status: Home / Self Care | Payer: Managed Care, Other (non HMO) | Source: Ambulatory Visit | Attending: Orthopedic Surgery | Admitting: Orthopedic Surgery

## 2011-03-09 ENCOUNTER — Encounter: Payer: Self-pay | Admitting: Orthopedic Surgery

## 2011-03-09 DIAGNOSIS — M25619 Stiffness of unspecified shoulder, not elsewhere classified: Secondary | ICD-10-CM | POA: Insufficient documentation

## 2011-03-09 DIAGNOSIS — M6281 Muscle weakness (generalized): Secondary | ICD-10-CM

## 2011-03-09 DIAGNOSIS — M25519 Pain in unspecified shoulder: Secondary | ICD-10-CM | POA: Insufficient documentation

## 2011-03-09 DIAGNOSIS — IMO0001 Reserved for inherently not codable concepts without codable children: Secondary | ICD-10-CM | POA: Insufficient documentation

## 2011-03-09 DIAGNOSIS — I10 Essential (primary) hypertension: Secondary | ICD-10-CM | POA: Insufficient documentation

## 2011-03-09 DIAGNOSIS — E785 Hyperlipidemia, unspecified: Secondary | ICD-10-CM | POA: Insufficient documentation

## 2011-03-09 NOTE — Progress Notes (Signed)
Occupational Therapy Treatment  Patient Details  Name: [name] [name] MRN: [id number] Date of Birth: 1956-01-01  Today's Date: 03/09/2011 Time: 1478-2956 Time Calculation (min): 18 min Visit#: 15  of 16   Re-eval: 03/14/11 [facility]  [phone]  57' Subjective Symptoms/Limitations Symptoms: S:  Dr Romeo Apple said to finish out my therapy. Pain Assessment Currently in Pain?: Yes Pain Score:   1 Pain Location: Shoulder Pain Orientation: Left Pain Type: Acute pain   Exercise/Treatments ROM / Strengthening / Isometric Strengthening    Patient arrived late and wanted stretching only secondary to wanting to get home before dark.  [facility] only.  Manual Therapy Manual Therapy: Myofascial release Myofascial Release: MFR, manual stretching, and trigger point release to left scapular, trapezius, SCM, rhomboid, upper arm, flexor and extensor forearm. Gentle traction to left arm through full flexion and abduction arc. PROM to shoulder and elbow through all ranges x 5 reps eac  Occupational Therapy Assessment and Plan OT Assessment and Plan Clinical Impression Statement: A:  Manuel only. OT Plan: P:  Resume exercises.   Goals Patient progressing toward all goals End of Session Patient Active Problem List  Diagnoses  . BREAST CANCER  . HYPERLIPIDEMIA  . CAD  . GERD  . KNEE PAIN  . ANSERINE BURSITIS  . EDEMA  . CHEST PAIN  . HYPERTENSION, HX OF  . Pulmonary embolus  . CAD (coronary artery disease)  . Shoulder pain  . Neck pain  . Left tennis elbow  . Muscle weakness (generalized)   End of Session Activity Tolerance: Patient tolerated treatment well General Behavior During Session: [facility] for tasks performed Cognition: [facility] for tasks performed  Fatimah Sundquist L. Harshitha Fretz, COTA/L  03/09/2011, 5:48 PM

## 2011-03-09 NOTE — Progress Notes (Signed)
F/u   Left shoulder , left elbow  Improved !  Normal ROM in forward elevation   Shoulder and elbow pain improved   Return as needed

## 2011-03-11 ENCOUNTER — Ambulatory Visit (HOSPITAL_COMMUNITY)
Admission: RE | Admit: 2011-03-11 | Discharge: 2011-03-11 | Disposition: A | Discharge status: Home / Self Care | Payer: Managed Care, Other (non HMO) | Source: Ambulatory Visit | Attending: Orthopedic Surgery | Admitting: Orthopedic Surgery

## 2011-03-11 DIAGNOSIS — M25519 Pain in unspecified shoulder: Secondary | ICD-10-CM | POA: Insufficient documentation

## 2011-03-11 DIAGNOSIS — M25619 Stiffness of unspecified shoulder, not elsewhere classified: Secondary | ICD-10-CM | POA: Insufficient documentation

## 2011-03-11 DIAGNOSIS — E785 Hyperlipidemia, unspecified: Secondary | ICD-10-CM | POA: Insufficient documentation

## 2011-03-11 DIAGNOSIS — I10 Essential (primary) hypertension: Secondary | ICD-10-CM | POA: Insufficient documentation

## 2011-03-11 DIAGNOSIS — IMO0001 Reserved for inherently not codable concepts without codable children: Secondary | ICD-10-CM | POA: Insufficient documentation

## 2011-03-11 DIAGNOSIS — M6281 Muscle weakness (generalized): Secondary | ICD-10-CM

## 2011-03-11 NOTE — Progress Notes (Signed)
Occupational Therapy Treatment  Patient Details  Name: [name] [name] MRN: [id number] Date of Birth: 04-Jun-1955  Today's Date: 03/11/2011 Time: 2956-2130 Time Calculation (min): 55 min Visit#: 16  of 16   Re-eval: 03/14/11 [facility]  [phone] 69' Therapeutic Exercise  [phone]  20'     Subjective Symptoms/Limitations Symptoms: S:  It is ok today, It woke me up last night. Pain Assessment Currently in Pain?: Yes Pain Score:   1 Pain Location: Shoulder Pain Orientation: Left   Exercise/Treatments Supine Protraction: PROM;Strengthening;10 reps Protraction Weight (lbs): 3# Horizontal ABduction: PROM;Strengthening;10 reps Horizontal ABduction Weight (lbs): 3# External Rotation: PROM;Strengthening;10 reps External Rotation Weight (lbs): 3# Internal Rotation: PROM;Strengthening;10 reps Internal Rotation Weight (lbs): 3# Flexion: PROM;Strengthening;10 reps Shoulder Flexion Weight (lbs): 3# ABduction: PROM;Strengthening;10 reps Shoulder ABduction Weight (lbs): 3# Seated Protraction Weight (lbs): 2# Horizontal ABduction: Strengthening;15 reps Horizontal ABduction Weight (lbs): 2# External Rotation: Strengthening;15 reps External Rotation Weight (lbs): 2# Internal Rotation: Strengthening;15 reps Internal Rotation Weight (lbs): 2# Flexion: Strengthening;15 reps Flexion Weight (lbs): 2# Abduction: Strengthening;15 reps ABduction Weight (lbs): 2#    Manual Therapy Manual Therapy: Myofascial release Myofascial Release: MFR, manual stretching, and trigger point release to left scapular, trapezius, SCM, rhomboid, upper arm, flexor and extensor forearm. Gentle traction to left arm through full flexion and abduction arc. PROM to shoulder and elbow through all ranges   Occupational Therapy Assessment and Plan OT Assessment and Plan Clinical Impression Statement: A:  Resumed all ex. Rehab Potential: Good OT Plan: P:  Reassess.   Goals Short Term Goals Short Term Goal 1:  Patient will be I with HEP. Short Term Goal 2: Patient will increase LUE PROM to [facility] for increased I at work. Short Term Goal 3: Patient will increase left shoulder strength to 4/5 for increased ability to lift binders at work. Short Term Goal 4: Patient will decrease pain in left shoulder and elbow to 3/10 during work activities. Short Term Goal 5: Patient will decrease fascial restrictions from max to mod. Long Term Goals Long Term Goal 1: Patient will return to prior level of I with all B/IADLs, work, and leisure activities. Long Term Goal 2: Patient will incorporate ergonomic recommendations offered by therapist for desk setup at work. Long Term Goal 3: Patient will increase LUE AROM to WNL for increased independence reaching overhead at work. Long Term Goal 4: Patient will increase LUE strength to 5/5 and increase left grip strength by 5 pounds for increased independence lifting binders at work. Long Term Goal 5: Patient will decrease pain level to 1/10 in her LUE. Additional Long Term Goals?: Yes Long Term Goal 6: Patient will decrease fascial restrictions to minimal in her LUE. End of Session Patient Active Problem List  Diagnoses  . BREAST CANCER  . HYPERLIPIDEMIA  . CAD  . GERD  . KNEE PAIN  . ANSERINE BURSITIS  . EDEMA  . CHEST PAIN  . HYPERTENSION, HX OF  . Pulmonary embolus  . CAD (coronary artery disease)  . Shoulder pain  . Neck pain  . Left tennis elbow  . Muscle weakness (generalized)   End of Session Activity Tolerance: Patient tolerated treatment well General Behavior During Session: [facility] for tasks performed Cognition: [facility] for tasks performed   Miamor Ayler L. Noralee Stain, COTA/L  03/11/2011, 5:23 PM

## 2011-03-15 ENCOUNTER — Ambulatory Visit (HOSPITAL_COMMUNITY)
Admission: RE | Admit: 2011-03-15 | Discharge: 2011-03-15 | Disposition: A | Discharge status: Home / Self Care | Payer: Managed Care, Other (non HMO) | Source: Ambulatory Visit | Attending: Pulmonary Disease | Admitting: Pulmonary Disease

## 2011-03-15 DIAGNOSIS — M6281 Muscle weakness (generalized): Secondary | ICD-10-CM

## 2011-03-15 NOTE — Progress Notes (Signed)
Occupational Therapy Treatment  Patient Details  Name: [name] [name] MRN: [id number] Date of Birth: 1955-07-04  Today's Date: 03/15/2011 Time: 7829-5621 Time Calculation (min): 46 min Manual Therapy [id number] 18' Reassess [phone] Therapeutic Ex [id number] 15' Visit#: 17  of 20   Re-eval: 03/25/11    Subjective Symptoms/Limitations Symptoms: S:  I can tell when I dont come for a few days. Pain Assessment Currently in Pain?: Yes Pain Score:   3 Pain Location: Shoulder Pain Orientation: Left Pain Type: Acute pain  O: Exercise/Treatments   03/15/11 0700 Shoulder Exercises: Seated Protraction Strengthening;15 reps Protraction Weight (lbs) 2# Horizontal ABduction Strengthening;15 reps Horizontal ABduction Weight (lbs) 2# External Rotation Strengthening;15 reps External Rotation Weight (lbs) 2# Internal Rotation Strengthening;15 reps Internal Rotation Weight (lbs) 2# Flexion Strengthening;15 reps Flexion Weight (lbs) 2# Abduction Strengthening;15 reps ABduction Weight (lbs) 2# Shoulder Exercises: ROM/Strengthening Cybex Press 2 plate;20 reps Cybex Row 2 plate;20 reps Wall Wash 2' with 3# "W" Arms 15 wtih 2 pounds X to V Arms 15 with 2 pounds   Manual Therapy Manual Therapy: Myofascial release Myofascial Release: MFR, manual stretching, and trigger point release to left scapular, trapezius, SCM, rhomboid, upper arm.  Gentle traction to LUE.  [id number]   Occupational Therapy Assessment and Plan OT Assessment and Plan Clinical Impression Statement: A:  Please refer to MD progress note. OT Plan: P:  Continue 2 x week x 2 weeks to decrease pain to 1/10 and decrease restrictions to minimal in right shoulder region   Goals Short Term Goals Short Term Goal 1: Patient will be I with HEP. Short Term Goal 1 Progress: Met Short Term Goal 2: Patient will increase LUE PROM to [facility] for increased I at work. Short Term Goal 2 Progress: Met Short Term Goal 3: Patient will increase  left shoulder strength to 4/5 for increased ability to lift binders at work. Short Term Goal 3 Progress: Met Short Term Goal 4: Patient will decrease pain in left shoulder and elbow to 3/10 during work activities. Short Term Goal 4 Progress: Met Short Term Goal 5: Patient will decrease fascial restrictions from max to mod. Short Term Goal 5 Progress: Met Long Term Goals Long Term Goal 1: Patient will return to prior level of I with all B/IADLs, work, and leisure activities. Long Term Goal 1 Progress: Progressing toward goal Long Term Goal 2: Patient will incorporate ergonomic recommendations offered by therapist for desk setup at work. Long Term Goal 2 Progress: Progressing toward goal Long Term Goal 3: Patient will increase LUE AROM to WNL for increased independence reaching overhead at work. Long Term Goal 3 Progress: Met Long Term Goal 4: Patient will increase LUE strength to 5/5 and increase left grip strength by 5 pounds for increased independence lifting binders at work. Long Term Goal 4 Progress: Progressing toward goal Long Term Goal 5: Patient will decrease pain level to 1/10 in her LUE. Long Term Goal 5 Progress: Progressing toward goal Additional Long Term Goals?: Yes Long Term Goal 6: Patient will decrease fascial restrictions to minimal in her LUE. Long Term Goal 6 Progress: Progressing toward goal End of Session Patient Active Problem List  Diagnoses  . BREAST CANCER  . HYPERLIPIDEMIA  . CAD  . GERD  . KNEE PAIN  . ANSERINE BURSITIS  . EDEMA  . CHEST PAIN  . HYPERTENSION, HX OF  . Pulmonary embolus  . CAD (coronary artery disease)  . Shoulder pain  . Neck pain  . Left tennis elbow  . Muscle  weakness (generalized)   End of Session Activity Tolerance: Patient tolerated treatment well General Behavior During Session: [facility] for tasks performed Cognition: [facility] for tasks performed   Shirlean Mylar, OTR/L  03/15/2011, 4:58 PM

## 2011-03-16 ENCOUNTER — Ambulatory Visit (HOSPITAL_COMMUNITY): Payer: Managed Care, Other (non HMO) | Admitting: Occupational Therapy

## 2011-03-18 ENCOUNTER — Ambulatory Visit (HOSPITAL_COMMUNITY): Payer: Managed Care, Other (non HMO) | Admitting: Specialist

## 2011-03-18 ENCOUNTER — Telehealth (HOSPITAL_COMMUNITY): Payer: Self-pay | Admitting: Specialist

## 2011-03-21 ENCOUNTER — Ambulatory Visit (HOSPITAL_COMMUNITY)
Admission: RE | Admit: 2011-03-21 | Discharge: 2011-03-21 | Disposition: A | Discharge status: Home / Self Care | Payer: Managed Care, Other (non HMO) | Source: Ambulatory Visit | Attending: Pulmonary Disease | Admitting: Pulmonary Disease

## 2011-03-21 DIAGNOSIS — M6281 Muscle weakness (generalized): Secondary | ICD-10-CM

## 2011-03-21 NOTE — Progress Notes (Signed)
Occupational Therapy Treatment  Patient Details  Name: [name] [name] MRN: [id number] Date of Birth: [birth year]  Today's Date: 03/21/2011 Time: 1191-4782 Time Calculation (min): 27 min Visit#: 17  of 20   Re-eval: 03/25/11 [facility]  [phone] 17' Therapeutic Exercise  507-516 9'    Subjective Symptoms/Limitations Symptoms: S:  I have not woke up in pain in the past two days.  I have been taking tylenol arthritis, my knees hurt. Pain Assessment Currently in Pain?: No/denies  Exercise/Treatments Supine Protraction: PROM;10 reps Horizontal ABduction: PROM;10 reps External Rotation: PROM;10 reps Internal Rotation: PROM;10 reps Flexion: PROM;10 reps ABduction: PROM;10 reps Seated Protraction: Strengthening;15 reps Protraction Weight (lbs): 2# Horizontal ABduction: Strengthening;15 reps Horizontal ABduction Weight (lbs): 2# External Rotation: Strengthening;15 reps External Rotation Weight (lbs): 2# Internal Rotation: Strengthening;15 reps Internal Rotation Weight (lbs): 2# Flexion: Strengthening;15 reps Flexion Weight (lbs): 2# Abduction: Strengthening;15 reps ABduction Weight (lbs): 2# ROM / Strengthening / Isometric Strengthening Cybex Press: 2 plate;20 reps Cybex Row: 2 plate;20 reps Wall Wash: 2' with 3# "W" Arms: 15 wtih 2 pounds X to V Arms: 15 with 2 pounds Myofascial Release. MFR, manual stretching, and trigger point release to left scapular, trapezius, SCM, rhomboid, upper arm, flexor and extensor forearm. Gentle traction to left arm through full flexion and abduction arc. PROM to shoulder and elbow through all ranges     Occupational Therapy Assessment and Plan OT Assessment and Plan Clinical Impression Statement: A:  Completed exercises with increase ease and speed. Rehab Potential: Good OT Plan: P:  Continue as planned.   Goals Short Term Goals Short Term Goal 1: Patient will be I with HEP. Short Term Goal 2: Patient will increase LUE PROM to [facility] for  increased I at work. Short Term Goal 3: Patient will increase left shoulder strength to 4/5 for increased ability to lift binders at work. Short Term Goal 4: Patient will decrease pain in left shoulder and elbow to 3/10 during work activities. Short Term Goal 5: Patient will decrease fascial restrictions from max to mod. Long Term Goals Long Term Goal 1: Patient will return to prior level of I with all B/IADLs, work, and leisure activities. Long Term Goal 2: Patient will incorporate ergonomic recommendations offered by therapist for desk setup at work. Long Term Goal 3: Patient will increase LUE AROM to WNL for increased independence reaching overhead at work. Long Term Goal 4: Patient will increase LUE strength to 5/5 and increase left grip strength by 5 pounds for increased independence lifting binders at work. Long Term Goal 5: Patient will decrease pain level to 1/10 in her LUE. Additional Long Term Goals?: Yes Long Term Goal 6: Patient will decrease fascial restrictions to minimal in her LUE. End of Session Patient Active Problem List  Diagnoses  . BREAST CANCER  . HYPERLIPIDEMIA  . CAD  . GERD  . KNEE PAIN  . ANSERINE BURSITIS  . EDEMA  . CHEST PAIN  . HYPERTENSION, HX OF  . Pulmonary embolus  . CAD (coronary artery disease)  . Shoulder pain  . Neck pain  . Left tennis elbow  . Muscle weakness (generalized)   End of Session Activity Tolerance: Patient tolerated treatment well General Behavior During Session: [facility] for tasks performed Cognition: [facility] for tasks performed   Tiwan Schnitker L. Noralee Stain, COTA/L  03/21/2011, 5:24 PM

## 2011-03-23 ENCOUNTER — Ambulatory Visit (HOSPITAL_COMMUNITY)
Admission: RE | Admit: 2011-03-23 | Discharge: 2011-03-23 | Disposition: A | Discharge status: Home / Self Care | Payer: Managed Care, Other (non HMO) | Source: Ambulatory Visit | Attending: Pulmonary Disease | Admitting: Pulmonary Disease

## 2011-03-23 DIAGNOSIS — M6281 Muscle weakness (generalized): Secondary | ICD-10-CM

## 2011-03-23 NOTE — Progress Notes (Signed)
Occupational Therapy Treatment  Patient Details  Name: PAELYN SMICK MRN: [id number] Date of Birth: 28-Aug-1955  Today's Date: 03/23/2011 Time: 1191-4782 Time Calculation (min): 29 min Visit#: 18  of 20   Re-eval: 03/25/11 [facility] [phone] 08' Therapeutic Exercise  504-520 16'    Subjective Symptoms/Limitations Symptoms: S:  I have been writing all day, that makes it hurt. Pain Assessment Currently in Pain?: No/denies Pain Score: 0-No pain  Exercise/Treatments Supine Protraction: PROM;10 reps Horizontal ABduction: PROM;10 reps External Rotation: PROM;10 reps Internal Rotation: PROM;10 reps Flexion: PROM;10 reps ABduction: PROM;10 reps Seated Protraction: Strengthening;15 reps Protraction Weight (lbs): 3# Horizontal ABduction: Strengthening;15 reps Horizontal ABduction Weight (lbs): 3# External Rotation: Strengthening;15 reps External Rotation Weight (lbs): 3# Internal Rotation: Strengthening;15 reps Internal Rotation Weight (lbs): 3# Flexion: Strengthening;15 reps Flexion Weight (lbs): 3# Abduction: Strengthening;15 reps ABduction Weight (lbs): 3# ROM / Strengthening / Isometric Strengthening Cybex Press: 2 plate;20 reps Cybex Row: 2 plate;20 reps Wall Wash: 2' with 3# "W" Arms: 15 wtih 2 pounds X to V Arms: 15 with 2 pounds Manual Therapy Manual Therapy: Myofascial release Myofascial Release: MFR, manual stretching, and trigger point release to left scapular, trapezius, SCM, rhomboid, upper arm, flexor and extensor forearm. Gentle traction to left arm through full flexion and abduction arc. PROM to shoulder and elbow through all ranges  Occupational Therapy Assessment and Plan OT Assessment and Plan Clinical Impression Statement: A:  Increased seated ex to 3# OT Plan: P:  Increase reps.   Goals Short Term Goals Short Term Goal 1: Patient will be I with HEP. Short Term Goal 2: Patient will increase LUE PROM to [facility] for increased I at work. Short Term  Goal 3: Patient will increase left shoulder strength to 4/5 for increased ability to lift binders at work. Short Term Goal 4: Patient will decrease pain in left shoulder and elbow to 3/10 during work activities. Short Term Goal 5: Patient will decrease fascial restrictions from max to mod. Long Term Goals Long Term Goal 1: Patient will return to prior level of I with all B/IADLs, work, and leisure activities. Long Term Goal 2: Patient will incorporate ergonomic recommendations offered by therapist for desk setup at work. Long Term Goal 3: Patient will increase LUE AROM to WNL for increased independence reaching overhead at work. Long Term Goal 4: Patient will increase LUE strength to 5/5 and increase left grip strength by 5 pounds for increased independence lifting binders at work. Long Term Goal 5: Patient will decrease pain level to 1/10 in her LUE. Additional Long Term Goals?: Yes Long Term Goal 6: Patient will decrease fascial restrictions to minimal in her LUE. End of Session Patient Active Problem List  Diagnoses  . BREAST CANCER  . HYPERLIPIDEMIA  . CAD  . GERD  . KNEE PAIN  . ANSERINE BURSITIS  . EDEMA  . CHEST PAIN  . HYPERTENSION, HX OF  . Pulmonary embolus  . CAD (coronary artery disease)  . Shoulder pain  . Neck pain  . Left tennis elbow  . Muscle weakness (generalized)   End of Session Activity Tolerance: Patient tolerated treatment well General Behavior During Session: [facility] for tasks performed Cognition: [facility] for tasks performed  Denni France L. Noralee Stain, COTA/L  03/23/2011, 5:28 PM

## 2011-03-28 ENCOUNTER — Ambulatory Visit (HOSPITAL_COMMUNITY)
Admission: RE | Admit: 2011-03-28 | Discharge: 2011-03-28 | Disposition: A | Discharge status: Home / Self Care | Payer: Managed Care, Other (non HMO) | Source: Ambulatory Visit | Attending: Pulmonary Disease | Admitting: Pulmonary Disease

## 2011-03-28 DIAGNOSIS — M6281 Muscle weakness (generalized): Secondary | ICD-10-CM

## 2011-03-28 NOTE — Progress Notes (Signed)
Occupational Therapy Treatment  Patient Details  Name: [name] [name] MRN: [id number] Date of Birth: August 27, 1955  Today's Date: 03/28/2011 Time: 4098-1191 Time Calculation (min): 42 min Visit#: 19  of 20   Re-eval: 03/30/11 [facility]  [phone] 62' Therapeutic Exercise  [phone] 15'    Subjective Symptoms/Limitations Symptoms: S:  I have felt cruddy all weekend.  I think I am getting a cold.  I have been using my arm all day. Pain Assessment Currently in Pain?: Yes Pain Score:   1 Pain Location: Shoulder Pain Orientation: Left Pain Type: Acute pain  Exercise/Treatments Supine Protraction: PROM;10 reps (d/c) Horizontal ABduction: PROM;10 reps (d/c) External Rotation: PROM;10 reps (d/c) Internal Rotation: PROM;10 reps (d/c) Flexion: PROM;10 reps (d/c) ABduction: PROM;10 reps (d/c) Seated ROM / Strengthening / Isometric Strengthening Cybex Press: 2.5 plate;10 reps Cybex Row: 2.5 plate;10 reps Wall Wash: 3' with 3# "W" Arms: 15 wtih 2 pounds X to V Arms: 15 with 2 pounds   Manual Therapy Manual Therapy: Myofascial release Myofascial Release: MFR, manual stretching, and trigger point release to left scapular, trapezius, SCM, rhomboid, upper arm, flexor and extensor forearm. Gentle traction to left arm through full flexion and abduction arc. PROM to shoulder and elbow through all ranges   Occupational Therapy Assessment and Plan OT Assessment and Plan Clinical Impression Statement: A:  D/C supine exercises and increased 3' with wall wash. Increased cybex to 21/2 plates. Rehab Potential: Good OT Plan: P:  Reassess and possible d/c.   Goals Short Term Goals Short Term Goal 1: Patient will be I with HEP. Short Term Goal 2: Patient will increase LUE PROM to [facility] for increased I at work. Short Term Goal 3: Patient will increase left shoulder strength to 4/5 for increased ability to lift binders at work. Short Term Goal 4: Patient will decrease pain in left shoulder and elbow  to 3/10 during work activities. Short Term Goal 5: Patient will decrease fascial restrictions from max to mod. Long Term Goals Long Term Goal 1: Patient will return to prior level of I with all B/IADLs, work, and leisure activities. Long Term Goal 2: Patient will incorporate ergonomic recommendations offered by therapist for desk setup at work. Long Term Goal 3: Patient will increase LUE AROM to WNL for increased independence reaching overhead at work. Long Term Goal 4: Patient will increase LUE strength to 5/5 and increase left grip strength by 5 pounds for increased independence lifting binders at work. Long Term Goal 5: Patient will decrease pain level to 1/10 in her LUE. Additional Long Term Goals?: Yes Long Term Goal 6: Patient will decrease fascial restrictions to minimal in her LUE. End of Session Patient Active Problem List  Diagnoses  . BREAST CANCER  . HYPERLIPIDEMIA  . CAD  . GERD  . KNEE PAIN  . ANSERINE BURSITIS  . EDEMA  . CHEST PAIN  . HYPERTENSION, HX OF  . Pulmonary embolus  . CAD (coronary artery disease)  . Shoulder pain  . Neck pain  . Left tennis elbow  . Muscle weakness (generalized)   End of Session Activity Tolerance: Patient tolerated treatment well General Behavior During Session: [facility] for tasks performed Cognition: [facility] for tasks performed   Kaelon Weekes L. Kyliyah Stirn, COTA/L  03/28/2011, 5:44 PM

## 2011-03-30 ENCOUNTER — Ambulatory Visit (HOSPITAL_COMMUNITY)
Admission: RE | Admit: 2011-03-30 | Discharge: 2011-03-30 | Disposition: A | Discharge status: Home / Self Care | Payer: Managed Care, Other (non HMO) | Source: Ambulatory Visit | Attending: Pulmonary Disease | Admitting: Pulmonary Disease

## 2011-03-30 DIAGNOSIS — M6281 Muscle weakness (generalized): Secondary | ICD-10-CM

## 2011-03-30 NOTE — Progress Notes (Signed)
Occupational Therapy Treatment  Patient Details  Name: [name] [name] MRN: [id number] Date of Birth: 09/20/1955  Today's Date: 03/30/2011 Time: 8295-6213 Time Calculation (min): 37 min Visit#: 20  of 20   Re-eval: 03/30/11 [facility] Therapy [phone]  15' Therapeutic Exercise  [phone]  21'  Subjective Symptoms/Limitations Symptoms: S:  Today is graduation day.  I am a little sore but no pain. Pain Assessment Currently in Pain?: No/denies   Exercise/Treatments Supine Protraction: PROM;10 reps Horizontal ABduction: PROM;10 reps External Rotation: PROM;10 reps Internal Rotation: PROM;10 reps Flexion: PROM;10 reps ABduction: PROM;10 reps Seated Protraction: Strengthening;15 reps Protraction Weight (lbs): 3# Horizontal ABduction: Strengthening;15 reps Horizontal ABduction Weight (lbs): 3# External Rotation: Strengthening;15 reps External Rotation Weight (lbs): 3# Internal Rotation: Strengthening;15 reps Internal Rotation Weight (lbs): 3# Flexion: Strengthening;15 reps Flexion Weight (lbs): 3# Abduction: Strengthening;15 reps ABduction Weight (lbs): 3# ROM / Strengthening / Isometric Strengthening Cybex Press: 2.5 plate;15 reps Cybex Row: 2.5 plate;15 reps Wall Wash: 3' with 3# "W" Arms: 15 wtih 2 pounds X to V Arms: 15 with 2 pounds   Manual Therapy Manual Therapy: Myofascial release Myofascial Release: MFR, manual stretching, and trigger point release to left scapular, trapezius, SCM, rhomboid, upper arm, flexor and extensor forearm. Gentle traction to left arm through full flexion and abduction arc. PROM to shoulder and elbow through all ranges   Occupational Therapy Assessment and Plan OT Assessment and Plan Clinical Impression Statement: A:  D/C to HEP.  See d/c note. OT Plan: P:  D/C   Goals Short Term Goals Short Term Goal 1: Patient will be I with HEP. Short Term Goal 1 Progress: Met Short Term Goal 2: Patient will increase LUE PROM to [facility] for increased I at  work. Short Term Goal 2 Progress: Met Short Term Goal 3: Patient will increase left shoulder strength to 4/5 for increased ability to lift binders at work. Short Term Goal 3 Progress: Met Short Term Goal 4: Patient will decrease pain in left shoulder and elbow to 3/10 during work activities. Short Term Goal 4 Progress: Met Short Term Goal 5: Patient will decrease fascial restrictions from max to mod. Short Term Goal 5 Progress: Met Long Term Goals Long Term Goal 1: Patient will return to prior level of I with all B/IADLs, work, and leisure activities. Long Term Goal 1 Progress: Met Long Term Goal 2: Patient will incorporate ergonomic recommendations offered by therapist for desk setup at work. Long Term Goal 2 Progress: Met Long Term Goal 3: Patient will increase LUE AROM to WNL for increased independence reaching overhead at work. Long Term Goal 3 Progress: Met Long Term Goal 4: Patient will increase LUE strength to 5/5 and increase left grip strength by 5 pounds for increased independence lifting binders at work. Long Term Goal 4 Progress: Met Long Term Goal 5: Patient will decrease pain level to 1/10 in her LUE. Long Term Goal 5 Progress: Met Additional Long Term Goals?: Yes Long Term Goal 6: Patient will decrease fascial restrictions to minimal in her LUE. Long Term Goal 6 Progress: Met End of Session Patient Active Problem List  Diagnoses  . BREAST CANCER  . HYPERLIPIDEMIA  . CAD  . GERD  . KNEE PAIN  . ANSERINE BURSITIS  . EDEMA  . CHEST PAIN  . HYPERTENSION, HX OF  . Pulmonary embolus  . CAD (coronary artery disease)  . Shoulder pain  . Neck pain  . Left tennis elbow  . Muscle weakness (generalized)   End of Session Activity  Tolerance: Patient tolerated treatment well General Behavior During Session: [facility] for tasks performed Cognition: [facility] for tasks performed   Elvis Boot L. Jaeliana Lococo, COTA/L  03/30/2011, 3:41 PM

## 2011-04-04 ENCOUNTER — Ambulatory Visit (HOSPITAL_COMMUNITY): Payer: Managed Care, Other (non HMO) | Admitting: Occupational Therapy

## 2011-04-06 ENCOUNTER — Ambulatory Visit (HOSPITAL_COMMUNITY): Payer: Managed Care, Other (non HMO) | Admitting: Occupational Therapy

## 2011-04-15 ENCOUNTER — Ambulatory Visit (HOSPITAL_COMMUNITY): Payer: Managed Care, Other (non HMO) | Admitting: Oncology

## 2011-04-29 ENCOUNTER — Encounter (HOSPITAL_COMMUNITY): Payer: Self-pay | Admitting: Oncology

## 2011-04-29 ENCOUNTER — Encounter (HOSPITAL_COMMUNITY): Payer: Managed Care, Other (non HMO) | Attending: Oncology | Admitting: Oncology

## 2011-04-29 DIAGNOSIS — F329 Major depressive disorder, single episode, unspecified: Secondary | ICD-10-CM

## 2011-04-29 DIAGNOSIS — E669 Obesity, unspecified: Secondary | ICD-10-CM

## 2011-04-29 DIAGNOSIS — C50919 Malignant neoplasm of unspecified site of unspecified female breast: Secondary | ICD-10-CM

## 2011-04-29 DIAGNOSIS — Z853 Personal history of malignant neoplasm of breast: Secondary | ICD-10-CM

## 2011-04-29 NOTE — Progress Notes (Signed)
Fredirick Maudlin, MD, MD [address] [place] [address]  1. BREAST CANCER     CURRENT THERAPY:She is status post EC x4 cycles followed by radiation therapy, which finished on 08/22/2006 by Dr. Jackelyn Knife.  She then started Aromasin on 08/27/2006 and she finished 5 years total. We chose not to give her tamoxifen because of her history of a pulmonary embolus.    INTERVAL HISTORY: [name] [name] 55 y.o. female returns for  regular  visit for followup of  Stage II (T2 N1 M0) adenocarcinoma of the left breast, presenting with a 4.2 cm primary, grade 1, tubulolobular features with LVI and one of 2 positive sentinel nodes. 17 additional nodes were negative.  Her ER receptor were 90%, PR receptor 96%, HER-2/neu negative, Ki-67 marker 3%. She is status post EC x4 cycles followed by radiation therapy, which finished on 08/22/2006 by Dr. Jackelyn Knife.  She then started Aromasin on 08/27/2006 and she will finish 5 years total. We chose not to give her tamoxifen because of her history of a pulmonary embolus.   The patient denies any complaints.  She reports that holidays are always a tough time for.  Particularly this Thanksgiving which happened to fall on the same day as her sons death and birthday.  He passed away 10 years ago from leukemia.  She finds that the holiday season is especially a saddened time for her due to this experience.    The patient feels well.  She denies any bowel or urinary complaints.    The patient asks how we will continue follow-up appointments.  This will be outlined in the plan below.   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  Oncologically, the patient denies any complaints. She denies any identification of any new lumps. She does perform self breast exams and has not noticed any changes. She will continue with yearly  mammograms.  Past Medical History  Diagnosis Date  . CAD (coronary artery disease) 2006    DES to LAD  . Chest pain   . HTN (hypertension)   . Hyperlipidemia   . GERD (gastroesophageal reflux disease)   . Breast cancer   . Edema     legs  . Anserine bursitis   . Knee pain   . Cardiomegaly   . MI (myocardial infarction)   . Pulmonary embolism   . Kidney stones     has BREAST CANCER; HYPERLIPIDEMIA; CAD; GERD; KNEE PAIN; ANSERINE BURSITIS; EDEMA; CHEST PAIN; HYPERTENSION, HX OF; Pulmonary embolus; CAD (coronary artery disease); Shoulder pain; Neck pain; Left tennis elbow; and Muscle weakness (generalized) on her problem list.     is allergic to codeine; duricef; pentazocine lactate; talwin; and vicodin.  Ms. Renier does not currently have medications on file.  Past Surgical History  Procedure Date  . Colonoscopy with cold snare and snare cautery polypectomy   . Cardiac catheterization     DES to LAD  . Cholecystectomy   . Lymphadenectomy   . Appendectomy   . Tonsilectomy, adenoidectomy, bilateral myringotomy and tubes   . Cystectomy     rt. palm  . C/s tubal ligation   . Stent implant   . Breast lumpectomy   . Tonsillectomy     Denies any headaches, dizziness, double vision, fevers, chills, night sweats, nausea, vomiting, diarrhea, constipation, chest pain, heart palpitations, shortness of breath,  blood in stool, black tarry stool, urinary pain, urinary burning, urinary frequency, hematuria.   PHYSICAL EXAMINATION  ECOG PERFORMANCE STATUS: 0 - Asymptomatic  There were no vitals filed for this visit.  GENERAL:alert, no distress, well nourished, well developed, comfortable, cooperative, obese and smiling SKIN: skin color, texture, turgor are normal, no rashes or significant lesions HEAD: Normocephalic, No masses, lesions, tenderness or abnormalities EYES: normal EARS: External ears normal OROPHARYNX:mucous membranes are moist  NECK: supple, no adenopathy, no bruits,  thyroid normal size, non-tender, without nodularity, no stridor, non-tender, trachea midline LYMPH:  no palpable lymphadenopathy, no hepatosplenomegaly BREAST:right breast normal without mass, skin or nipple changes or axillary nodes, left breast normal without mass, skin or nipple changes or axillary nodes with an inferior 5 o'clock healed surgical scar from lumpectomy LUNGS: clear to auscultation and percussion HEART: regular rate & rhythm, no murmurs, no gallops, S1 normal and S2 normal ABDOMEN:abdomen soft, non-tender, obese, normal bowel sounds and no hepatosplenomegaly BACK: Back symmetric, no curvature., No CVA tenderness EXTREMITIES:less then 2 second capillary refill, no joint deformities, effusion, or inflammation, no skin discoloration, no clubbing, no cyanosis, positive findings:  edema bilateral lower extremity 1+ nonpitting edema pretibially.  NEURO: alert & oriented x 3 with fluent speech, no focal motor/sensory deficits, gait normal   RADIOGRAPHIC STUDIES:  01/03/11  *RADIOLOGY REPORT*  Clinical Data: The patient underwent left lumpectomy, chemotherapy  and radiation therapy for breast cancer in 2007/2008.  DIGITAL DIAGNOSTIC BILATERAL MAMMOGRAM WITH CAD  Comparison: 12/30/2009, 12/26/2008, 12/17/2007  Findings: The breast tissue is almost entirely fatty. Left  lumpectomy changes are present. There is no dominant mass,  nonsurgical architectural distortion or calcification to suggest  malignancy.  Mammographic images were processed with CAD.  IMPRESSION:  No mammographic evidence of malignancy. Yearly diagnostic  mammography is suggested.  BI-RADS CATEGORY 2: Benign finding(s).  Original Report Authenticated By: Daryl Eastern, M.D.    ASSESSMENT:  1. Stage II (T2 N1 M0) adenocarcinoma of the left breast, presenting with a 4.2 cm primary, grade 1, tubulolobular features with LVI and one of 2 positive sentinel nodes. 17 additional nodes were negative.  Her ER  receptor were 90%, PR receptor 96%, HER-2/neu negative, Ki-67 marker 3%. She is status post EC x4 cycles followed by radiation therapy, which finished on 08/22/2006 by Dr. Jackelyn Knife.  She then started Aromasin on 08/27/2006 and she will finish 5 years total. We chose not to give her tamoxifen because of her history of a pulmonary embolus.  2. Reactive depression, secondary to loss of child to leukemia 10 years ago. 3. Obesity   PLAN:  1. The patient will continue her Aromasin and will complete 5 years of therapy in the 08/27/2011.   2. I personally reviewed and went over radiographic studies with the patient. 3. Lab work performed by Dr. Juanetta Gosling in October 2012.  Will get those results.  4. Return in 6 months for follow-up.  Will perform lab work at that time: CBC diff, CMET, if not performed recently elsewhere. We will see her again following this upcoming appointment in 6 months and then yearly thereafter for another 5 years.  At that time will release the patient from the clinic unless otherwise indicated.     All questions were answered. The patient knows to call the clinic with any problems, questions or concerns. We can certainly see the patient much sooner if necessary.  The patient and plan discussed with Glenford Peers, MD and he is in agreement with the  aforementioned.  I spent 20 minutes counseling the patient face to face. The total time spent in the appointment was 30 minutes.  Kai Calico

## 2011-04-29 NOTE — Patient Instructions (Signed)
[facility]  Discharge Instructions  RECOMMENDATIONS MADE BY THE CONSULTANT AND ANY TEST RESULTS WILL BE SENT TO YOUR REFERRING DOCTOR.  Please return in 6 months to see Dr. Mariel Sleet.  EXAM FINDINGS BY MD TODAY AND SIGNS AND SYMPTOMS TO REPORT TO CLINIC OR PRIMARY MD: I acknowledge that I have been informed and understand all the instructions given to me and received a copy. I do not have any more questions at this time, but understand that I may call the Specialty Clinic at [facility] at [phone] during business hours should I have any further questions or need assistance in obtaining follow-up care.    __________________________________________  _____________  __________ Signature of Patient or Authorized Representative            Date                   Time    __________________________________________ Nurse's Signature

## 2011-05-13 ENCOUNTER — Ambulatory Visit (HOSPITAL_COMMUNITY): Payer: Managed Care, Other (non HMO) | Admitting: Oncology

## 2011-05-27 ENCOUNTER — Ambulatory Visit (HOSPITAL_COMMUNITY): Payer: Managed Care, Other (non HMO) | Admitting: Oncology

## 2011-05-27 ENCOUNTER — Other Ambulatory Visit (HOSPITAL_COMMUNITY): Payer: Managed Care, Other (non HMO)

## 2011-05-30 ENCOUNTER — Other Ambulatory Visit (HOSPITAL_COMMUNITY): Payer: Managed Care, Other (non HMO)

## 2011-08-10 ENCOUNTER — Other Ambulatory Visit: Payer: Self-pay

## 2011-08-10 ENCOUNTER — Encounter (HOSPITAL_COMMUNITY): Payer: Self-pay | Admitting: *Deleted

## 2011-08-10 ENCOUNTER — Emergency Department (HOSPITAL_COMMUNITY): Payer: Managed Care, Other (non HMO)

## 2011-08-10 ENCOUNTER — Emergency Department (HOSPITAL_COMMUNITY)
Admission: EM | Admit: 2011-08-10 | Discharge: 2011-08-11 | Disposition: A | Discharge status: Home / Self Care | Payer: Managed Care, Other (non HMO) | Attending: Emergency Medicine | Admitting: Emergency Medicine

## 2011-08-10 DIAGNOSIS — Z79899 Other long term (current) drug therapy: Secondary | ICD-10-CM | POA: Insufficient documentation

## 2011-08-10 DIAGNOSIS — B9789 Other viral agents as the cause of diseases classified elsewhere: Secondary | ICD-10-CM | POA: Insufficient documentation

## 2011-08-10 DIAGNOSIS — B349 Viral infection, unspecified: Secondary | ICD-10-CM

## 2011-08-10 DIAGNOSIS — K2970 Gastritis, unspecified, without bleeding: Secondary | ICD-10-CM | POA: Insufficient documentation

## 2011-08-10 DIAGNOSIS — N390 Urinary tract infection, site not specified: Secondary | ICD-10-CM

## 2011-08-10 DIAGNOSIS — E785 Hyperlipidemia, unspecified: Secondary | ICD-10-CM | POA: Insufficient documentation

## 2011-08-10 DIAGNOSIS — Z853 Personal history of malignant neoplasm of breast: Secondary | ICD-10-CM | POA: Insufficient documentation

## 2011-08-10 DIAGNOSIS — I2510 Atherosclerotic heart disease of native coronary artery without angina pectoris: Secondary | ICD-10-CM | POA: Insufficient documentation

## 2011-08-10 DIAGNOSIS — I252 Old myocardial infarction: Secondary | ICD-10-CM | POA: Insufficient documentation

## 2011-08-10 DIAGNOSIS — K2990 Gastroduodenitis, unspecified, without bleeding: Secondary | ICD-10-CM | POA: Insufficient documentation

## 2011-08-10 DIAGNOSIS — K219 Gastro-esophageal reflux disease without esophagitis: Secondary | ICD-10-CM | POA: Insufficient documentation

## 2011-08-10 DIAGNOSIS — I10 Essential (primary) hypertension: Secondary | ICD-10-CM | POA: Insufficient documentation

## 2011-08-10 LAB — BASIC METABOLIC PANEL
BUN: 13 mg/dL (ref 6–23)
CO2: 26 mEq/L (ref 19–32)
Calcium: 9.9 mg/dL (ref 8.4–10.5)
Chloride: 100 mEq/L (ref 96–112)
Creatinine, Ser: 0.51 mg/dL (ref 0.50–1.10)
Glucose, Bld: 116 mg/dL — ABNORMAL HIGH (ref 70–99)

## 2011-08-10 LAB — URINE MICROSCOPIC-ADD ON

## 2011-08-10 LAB — CBC
HCT: 42.9 % (ref 36.0–46.0)
Hemoglobin: 14.5 g/dL (ref 12.0–15.0)
MCH: 31.5 pg (ref 26.0–34.0)
MCV: 93.1 fL (ref 78.0–100.0)
Platelets: 235 10*3/uL (ref 150–400)
RBC: 4.61 MIL/uL (ref 3.87–5.11)
WBC: 6.6 10*3/uL (ref 4.0–10.5)

## 2011-08-10 LAB — URINALYSIS, ROUTINE W REFLEX MICROSCOPIC
Bilirubin Urine: NEGATIVE
Glucose, UA: NEGATIVE mg/dL
Hgb urine dipstick: NEGATIVE
Protein, ur: NEGATIVE mg/dL

## 2011-08-10 LAB — HEPATIC FUNCTION PANEL
ALT: 43 U/L — ABNORMAL HIGH (ref 0–35)
AST: 37 U/L (ref 0–37)
Alkaline Phosphatase: 76 U/L (ref 39–117)
Bilirubin, Direct: 0.1 mg/dL (ref 0.0–0.3)
Indirect Bilirubin: 0.5 mg/dL (ref 0.3–0.9)

## 2011-08-10 LAB — TROPONIN I: Troponin I: <0.3 ng/mL (ref <0.30)

## 2011-08-10 MED ORDER — KETOROLAC TROMETHAMINE 30 MG/ML IJ SOLN
30.0000 mg | Freq: Once | INTRAMUSCULAR | Status: AC
  Administered 2011-08-10: 30 mg via INTRAVENOUS
  Filled 2011-08-10: qty 1

## 2011-08-10 MED ORDER — ONDANSETRON HCL 4 MG/2ML IJ SOLN
4.0000 mg | Freq: Once | INTRAMUSCULAR | Status: AC
  Administered 2011-08-10: 4 mg via INTRAVENOUS
  Filled 2011-08-10: qty 2

## 2011-08-10 MED ORDER — SULFAMETHOXAZOLE-TMP DS 800-160 MG PO TABS
1.0000 | ORAL_TABLET | Freq: Once | ORAL | Status: AC
  Administered 2011-08-10: 1 via ORAL
  Filled 2011-08-10: qty 1

## 2011-08-10 MED ORDER — SODIUM CHLORIDE 0.9 % IV SOLN: INTRAVENOUS | Status: DC

## 2011-08-10 MED ORDER — FAMOTIDINE IN NACL 20-0.9 MG/50ML-% IV SOLN
20.0000 mg | Freq: Once | INTRAVENOUS | Status: AC
  Administered 2011-08-10: 20 mg via INTRAVENOUS
  Filled 2011-08-10: qty 50

## 2011-08-10 MED ORDER — SODIUM CHLORIDE 0.9 % IV BOLUS (SEPSIS)
1000.0000 mL | Freq: Once | INTRAVENOUS | Status: AC
  Administered 2011-08-10: 1000 mL via INTRAVENOUS

## 2011-08-10 NOTE — ED Provider Notes (Signed)
History    This chart was scribed for [name] Bonier, MD, MD by Smitty Pluck. The patient was seen in room APA15 and the patient's care was started at 9:20PM.    CSN: [id number]  Arrival date & time 08/10/11  2029   First MD Initiated Contact with Patient 08/10/11 2101      Chief Complaint  Patient presents with  . Chest Pain    (Consider location/radiation/quality/duration/timing/severity/associated sxs/prior treatment) The history is provided by the patient.   [name] [name] is a 56 y.o. female who presents to the Emergency Department complaining of moderate chest pain, abdominal pain and nausea. Pt reports that she had last normal bowel movement today. She reports having fever of 101.3 and chills today. She has moderate back pain. Pt has had appendectomy, gallbladder removed and c-section. She denies diarrhea and dysuria. She reports that she has had decreased fluid intake. They symptoms have been constant since onset without radiation.  Past Medical History  Diagnosis Date  . CAD (coronary artery disease) 2006    DES to LAD  . Chest pain   . HTN (hypertension)   . Hyperlipidemia   . GERD (gastroesophageal reflux disease)   . Breast cancer   . Edema     legs  . Anserine bursitis   . Knee pain   . Cardiomegaly   . MI (myocardial infarction)   . Pulmonary embolism   . Kidney stones     Past Surgical History  Procedure Date  . Colonoscopy with cold snare and snare cautery polypectomy   . Cardiac catheterization     DES to LAD  . Cholecystectomy   . Lymphadenectomy   . Appendectomy   . Tonsilectomy, adenoidectomy, bilateral myringotomy and tubes   . Cystectomy     rt. palm  . C/s tubal ligation   . Stent implant   . Breast lumpectomy   . Tonsillectomy     Family History  Problem Relation Age of Onset  . Coronary artery disease    . Thrombophlebitis    . Arthritis    . Cancer    . Lung disease    . Asthma    . Diabetes      History  Substance Use  Topics  . Smoking status: Former Games developer  . Smokeless tobacco: Not on file  . Alcohol Use: No    OB History    Grav Para Term Preterm Abortions TAB SAB Ect Mult Living                  Review of Systems  All other systems reviewed and are negative.   10 Systems reviewed and all are negative for acute change except as noted in the HPI.   Allergies  Codeine; Duricef; Pentazocine lactate; Talwin; and Vicodin  Home Medications   Current Outpatient Rx  Name Route Sig Dispense Refill  . ACETAMINOPHEN 325 MG PO TABS Oral Take 325 mg by mouth every 6 (six) hours as needed.      . ASPIRIN 81 MG PO TABS Oral Take 81 mg by mouth daily.      . TUMS PO Oral Take by mouth daily.      . STOOL SOFTENER PO Oral Take by mouth as needed.     Marland Kitchen EXEMESTANE 25 MG PO TABS Oral Take 25 mg by mouth daily.      Marland Kitchen EZETIMIBE 10 MG PO TABS Oral Take 10 mg by mouth daily.      Marland Kitchen  OMEGA-3 FATTY ACIDS 1000 MG PO CAPS Oral Take 2 g by mouth 2 (two) times daily.      Marland Kitchen METOPROLOL TARTRATE 25 MG PO TABS  Take 1 tab daily 90 tablet 3  . MULTIVITAMINS PO CAPS Oral Take 1 capsule by mouth daily.      Marland Kitchen NITROGLYCERIN 0.4 MG SL SUBL Sublingual Place 1 tablet (0.4 mg total) under the tongue every 5 (five) minutes as needed for chest pain. 90 tablet 3  . ROSUVASTATIN CALCIUM 20 MG PO TABS Oral Take 20 mg by mouth daily.      Marland Kitchen VITAMIN B-12 500 MCG PO TABS Oral Take 500 mcg by mouth daily.      Marland Kitchen VITAMIN D (CHOLECALCIFEROL) PO Oral Take 2,000 mg by mouth daily.        BP 121/78  Pulse 109  Temp(Src) 98.9 F (37.2 C) (Oral)  Resp 20  Ht 5\' 5"  (1.651 m)  Wt 220 lb (99.791 kg)  BMI 36.61 kg/m2  SpO2 96%  Physical Exam  Nursing note and vitals reviewed. Constitutional: She is oriented to person, place, and time. She appears well-developed and well-nourished. No distress.  HENT:  Head: Normocephalic and atraumatic.  Mouth/Throat: Oropharynx is clear and moist. No oropharyngeal exudate.       No erythema     Eyes: Conjunctivae are normal. Pupils are equal, round, and reactive to light.  Cardiovascular: Normal rate, regular rhythm and normal heart sounds.  Exam reveals no gallop and no friction rub.   No murmur heard. Pulmonary/Chest: Effort normal and breath sounds normal. No respiratory distress. She has no wheezes. She has no rales.  Abdominal: Soft. She exhibits no mass. There is no tenderness. There is no rebound and no guarding.       Bowel sounds are hyperactive but nl in character   Neurological: She is alert and oriented to person, place, and time.  Skin: Skin is warm and dry.  Psychiatric: She has a normal mood and affect. Her behavior is normal.    ED Course  Procedures (including critical care time) DIAGNOSTIC STUDIES: Oxygen Saturation is 96% on room air, normal by my interpretation.    COORDINATION OF CARE: 9:24PM EDP discusses pt ED treatment with pt    Labs Reviewed  BASIC METABOLIC PANEL - Abnormal; Notable for the following:    Glucose, Bld 116 (*)    All other components within normal limits  HEPATIC FUNCTION PANEL - Abnormal; Notable for the following:    ALT 43 (*)    All other components within normal limits  CBC  TROPONIN I  LIPASE, BLOOD  URINALYSIS, ROUTINE W REFLEX MICROSCOPIC   Chest Portable 1 View  08/10/2011  *RADIOLOGY REPORT*  Clinical Data: Chest pain tonight.  PORTABLE CHEST - 1 VIEW  Comparison: 03/09/2006  Findings: Slightly shallow inspiration.  Heart size and pulmonary vascularity are normal for technique.  No focal airspace consolidation.  No blunting of costophrenic angles.  No pneumothorax.  No significant change since previous study, allowing for technical differences.  IMPRESSION: No evidence of active pulmonary disease.  Original Report Authenticated By: Marlon Pel, M.D.     No diagnosis found.   Date: 08/11/2011  Rate: 106  Rhythm: sinus tachycardia  QRS Axis: normal  Intervals: normal  ST/T Wave abnormalities: normal   Conduction Disutrbances: none  Narrative Interpretation: sinus tachycardia, otherwise unremarkable      MDM  The patient has epigastric pain as well as lower abdominal pain, nausea, and  fever onset today. It is most likely that this patient is fighting off a viral gastroenteritis, despite not manifesting the typical vomiting and diarrhea. This is an epidemic in our region currently and I am seeing many patients with similar symptoms. She does not have a gallbladder or appendix to cause localized infection, and does not have a parent pancreatitis. She does have an apparent urinary tract infection. While this could explain the fever and abdominal discomfort, I do not think this is the lone factor. I do think that in addition to the urinary tract infection she is fighting off a viral gastritis or gastroenteritis. I advised her that other symptoms may develop such as vomiting and diarrhea. Regardless, I will treat the urinary tract infection with antibiotics, and provide her with symptomatic treatment including anti-emetics and antacids for her other symptoms. I do not suspect myocardial infarction, acute coronary syndrome, or unstable angina, despite the patient having a history of coronary artery disease, as her symptoms would be very atypical for this and her physical examination is well would not suggest.  I discussed the findings, impression, and plan with the patient who states her understanding of and agreement with that.  I personally performed the services described in this documentation, which was scribed in my presence. The recorded information has been reviewed and considered.        [name] Bonier, MD 08/11/11 [phone]

## 2011-08-10 NOTE — ED Notes (Signed)
Chest pain, abd pain, fever,nausea, no diarrhea.  Cough, congestion

## 2011-08-11 LAB — URINE CULTURE: Colony Count: NO GROWTH

## 2011-08-11 MED ORDER — FAMOTIDINE 20 MG PO TABS: 20.0000 mg | ORAL_TABLET | Freq: Two times a day (BID) | ORAL | Status: DC | PRN

## 2011-08-11 MED ORDER — GI COCKTAIL ~~LOC~~
30.0000 mL | Freq: Once | ORAL | Status: AC
  Administered 2011-08-11: 30 mL via ORAL
  Filled 2011-08-11: qty 30

## 2011-08-11 MED ORDER — SULFAMETHOXAZOLE-TMP DS 800-160 MG PO TABS: 1.0000 | ORAL_TABLET | Freq: Two times a day (BID) | ORAL | Status: AC

## 2011-08-11 MED ORDER — ONDANSETRON HCL 8 MG PO TABS: 8.0000 mg | ORAL_TABLET | Freq: Three times a day (TID) | ORAL | Status: AC | PRN

## 2011-08-11 NOTE — Discharge Instructions (Signed)
Gastritis Gastritis is an inflammation (the body's way of reacting to injury and/or infection) of the stomach. It is often caused by viral or bacterial (germ) infections. It can also be caused by chemicals (including alcohol) and medications. This illness may be associated with generalized malaise (feeling tired, not well), cramps, and fever. The illness may last 2 to 7 days. If symptoms of gastritis continue, gastroscopy (looking into the stomach with a telescope-like instrument), biopsy (taking tissue samples), and/or blood tests may be necessary to determine the cause. Antibiotics will not affect the illness unless there is a bacterial infection present. One common bacterial cause of gastritis is an organism known as H. Pylori. This can be treated with antibiotics. Other forms of gastritis are caused by too much acid in the stomach. They can be treated with medications such as H2 blockers and antacids. Home treatment is usually all that is needed. Young children will quickly become dehydrated (loss of body fluids) if vomiting and diarrhea are both present. Medications may be given to control nausea. Medications are usually not given for diarrhea unless especially bothersome. Some medications slow the removal of the virus from the gastrointestinal tract. This slows down the healing process. HOME CARE INSTRUCTIONS Home care instructions for nausea and vomiting:  For adults: drink small amounts of fluids often. Drink at least 2 quarts a day. Take sips frequently. Do not drink large amounts of fluid at one time. This may worsen the nausea.   Only take over-the-counter or prescription medicines for pain, discomfort, or fever as directed by your caregiver.   Drink clear liquids only. Those are anything you can see through such as water, broth, or soft drinks.   Once you are keeping clear liquids down, you may start full liquids, soups, juices, and ice cream or sherbet. Slowly add bland (plain, not spicy)  foods to your diet.  Home care instructions for diarrhea:  Diarrhea can be caused by bacterial infections or a virus. Your condition should improve with time, rest, fluids, and/or anti-diarrheal medication.   Until your diarrhea is under control, you should drink clear liquids often in small amounts. Clear liquids include: water, broth, jell-o water and weak tea.  Avoid:  Milk.   Fruits.   Tobacco.   Alcohol.   Extremely hot or cold fluids.   Too much intake of anything at one time.  When your diarrhea stops you may add the following foods, which help the stool to become more formed:  Rice.   Bananas.   Apples without skin.   Dry toast.  Once these foods are tolerated you may add low-fat yogurt and low-fat cottage cheese. They will help to restore the normal bacterial balance in your bowel. Wash your hands well to avoid spreading bacteria (germ) or virus. SEEK IMMEDIATE MEDICAL CARE IF:   You are unable to keep fluids down.   Vomiting or diarrhea become persistent (constant).   Abdominal pain develops, increases, or localizes. (Right sided pain can be appendicitis. Left sided pain in adults can be diverticulitis.)   You develop a fever (an oral temperature above 102 F (38.9 C)).   Diarrhea becomes excessive or contains blood or mucus.   You have excessive weakness, dizziness, fainting or extreme thirst.   You are not improving or you are getting worse.   You have any other questions or concerns.  Document Released: 04/19/2001 Document Revised: 04/14/2011 Document Reviewed: 04/25/2005 [facility] Patient Information 2012 [place], [place].Urinary Tract Infection Infections of the urinary tract can start  in several places. A bladder infection (cystitis), a kidney infection (pyelonephritis), and a prostate infection (prostatitis) are different types of urinary tract infections (UTIs). They usually get better if treated with medicines (antibiotics) that kill germs. Take all  the medicine until it is gone. You or your child may feel better in a few days, but TAKE ALL MEDICINE or the infection may not respond and may become more difficult to treat. HOME CARE INSTRUCTIONS   Drink enough water and fluids to keep the urine clear or pale yellow. Cranberry juice is especially recommended, in addition to large amounts of water.   Avoid caffeine, tea, and carbonated beverages. They tend to irritate the bladder.   Alcohol may irritate the prostate.   Only take over-the-counter or prescription medicines for pain, discomfort, or fever as directed by your caregiver.  To prevent further infections:  Empty the bladder often. Avoid holding urine for long periods of time.   After a bowel movement, women should cleanse from front to back. Use each tissue only once.   Empty the bladder before and after sexual intercourse.  FINDING OUT THE RESULTS OF YOUR TEST Not all test results are available during your visit. If your or your child's test results are not back during the visit, make an appointment with your caregiver to find out the results. Do not assume everything is normal if you have not heard from your caregiver or the medical facility. It is important for you to follow up on all test results. SEEK MEDICAL CARE IF:   There is back pain.   Your baby is older than 3 months with a rectal temperature of 100.5 F (38.1 C) or higher for more than 1 day.   Your or your child's problems (symptoms) are no better in 3 days. Return sooner if you or your child is getting worse.  SEEK IMMEDIATE MEDICAL CARE IF:   There is severe back pain or lower abdominal pain.   You or your child develops chills.   You have a fever.   Your baby is older than 3 months with a rectal temperature of 102 F (38.9 C) or higher.   Your baby is 26 months old or younger with a rectal temperature of 100.4 F (38 C) or higher.   There is nausea or vomiting.   There is continued burning or  discomfort with urination.  MAKE SURE YOU:   Understand these instructions.   Will watch your condition.   Will get help right away if you are not doing well or get worse.  Document Released: 02/02/2005 Document Revised: 04/14/2011 Document Reviewed: 09/07/2006 [facility] Patient Information 2012 [place], [place].Viral Syndrome You or your child has Viral Syndrome. It is the most common infection causing "colds" and infections in the nose, throat, sinuses, and breathing tubes. Sometimes the infection causes nausea, vomiting, or diarrhea. The germ that causes the infection is a virus. No antibiotic or other medicine will kill it. There are medicines that you can take to make you or your child more comfortable.  HOME CARE INSTRUCTIONS   Rest in bed until you start to feel better.   If you have diarrhea or vomiting, eat small amounts of crackers and toast. Soup is helpful.   Do not give aspirin or medicine that contains aspirin to children.   Only take over-the-counter or prescription medicines for pain, discomfort, or fever as directed by your caregiver.  SEEK IMMEDIATE MEDICAL CARE IF:   You or your child has not improved  within one week.   You or your child has pain that is not at least partially relieved by over-the-counter medicine.   Thick, colored mucus or blood is coughed up.   Discharge from the nose becomes thick yellow or green.   Diarrhea or vomiting gets worse.   There is any major change in your or your child's condition.   You or your child develops a skin rash, stiff neck, severe headache, or are unable to hold down food or fluid.   You or your child has an oral temperature above 102 F (38.9 C), not controlled by medicine.   Your baby is older than 3 months with a rectal temperature of 102 F (38.9 C) or higher.   Your baby is 55 months old or younger with a rectal temperature of 100.4 F (38 C) or higher.  Document Released: 04/10/2006 Document Revised:  04/14/2011 Document Reviewed: 04/11/2007 [facility] Patient Information 2012 [place], [place].

## 2011-08-26 ENCOUNTER — Ambulatory Visit (HOSPITAL_COMMUNITY): Payer: Managed Care, Other (non HMO) | Admitting: Oncology

## 2011-10-25 ENCOUNTER — Other Ambulatory Visit: Payer: Self-pay | Admitting: *Deleted

## 2011-10-25 MED ORDER — METOPROLOL TARTRATE 25 MG PO TABS: ORAL_TABLET | ORAL | Status: DC

## 2011-10-28 ENCOUNTER — Encounter (HOSPITAL_COMMUNITY): Payer: Self-pay | Admitting: Oncology

## 2011-10-28 ENCOUNTER — Encounter (HOSPITAL_COMMUNITY): Payer: Managed Care, Other (non HMO) | Attending: Oncology | Admitting: Oncology

## 2011-10-28 VITALS — BP 161/90 | HR 85 | Temp 98.0°F | Wt 257.6 lb

## 2011-10-28 DIAGNOSIS — C50919 Malignant neoplasm of unspecified site of unspecified female breast: Secondary | ICD-10-CM

## 2011-10-28 DIAGNOSIS — Z853 Personal history of malignant neoplasm of breast: Secondary | ICD-10-CM

## 2011-10-28 DIAGNOSIS — E669 Obesity, unspecified: Secondary | ICD-10-CM

## 2011-10-28 DIAGNOSIS — I2699 Other pulmonary embolism without acute cor pulmonale: Secondary | ICD-10-CM

## 2011-10-28 NOTE — Patient Instructions (Addendum)
[name] [name]  [id number] [birth year] Dr. Glenford Peers  [facility]  Discharge Instructions  RECOMMENDATIONS MADE BY THE CONSULTANT AND ANY TEST RESULTS WILL BE SENT TO YOUR REFERRING DOCTOR.   EXAM FINDINGS BY MD TODAY AND SIGNS AND SYMPTOMS TO REPORT TO CLINIC OR PRIMARY MD: Discussion and exam by MD.  [name] are still young and your weight is too much for your height.  Being overweight increases your risk of Heart Disease, Stroke, High Blood Pressure, Diabetes and Liver Disease.  You might want to talk with Dr.  Juanetta Gosling about weight reduction.  No evidence of recurrence by exam.    MEDICATIONS PRESCRIBED: none   INSTRUCTIONS GIVEN AND DISCUSSED: Other :  Last colonoscopy was 06/02/2008 and per Dr. Cira Servant' office you are due for repeat 05/29/2013. Report any new lumps, bone pain or shortness of breath.  SPECIAL INSTRUCTIONS/FOLLOW-UP: Return to Clinic in 1 year.   I acknowledge that I have been informed and understand all the instructions given to me and received a copy. I do not have any more questions at this time, but understand that I may call the Specialty Clinic at [facility] at [phone] during business hours should I have any further questions or need assistance in obtaining follow-up care.    __________________________________________  _____________  __________ Signature of Patient or Authorized Representative            Date                   Time    __________________________________________ Nurse's Signature

## 2011-10-28 NOTE — Progress Notes (Signed)
CC:   [name] [name], M.D. Edward L. Juanetta Gosling, M.D.  DIAGNOSES: 1. Stage II (T2 N1 M0) adenocarcinoma of the left breast 4.2 cm, grade     1 tubulolobular features with LVI and 1 of 2 positive sentinel     nodes and 17 additional nodes were negative.  Her ER receptors were     90%, PR receptors 96%, HER-2/neu was negative.  Ki-67 marker was     low at 3% and she was operated on by Dr. Cicero Duck with her date     of surgery initially being 01/18/2006 and then lymph node     dissection on 03/01/2006.  We treated her with EC x4 cycles     followed by radiation therapy finishing as of 08/22/2006 by Dr. Dorna Bloom     and then we started Aromasin on 08/27/2006 and she took that for 5     years ending just approximately 2 weeks ago.  We chose not to give     her tamoxifen because of pulmonary emboli. 2. Pulmonary emboli postoperatively and she took Coumadin for 6     months. 3. Codeine intolerance. 4. Obesity.  We did have a long discussion about the risks of obesity     going forward.  I have asked her to think about a consultation with     one of the surgeons in [place] that does bariatric surgery after     she discusses things with Dr. Juanetta Gosling the next time she sees him.     We went over the ramifications of obesity in the long-term     including liver disease, potential liver failure, splenomegaly and     the risk of hepatocellular carcinoma.  So I think we need to start     looking at her other major issues in a preventative fashion. 5. C-section with tubal ligation 1992. 6. Talwin intolerance. 7. Duricef allergy which gives her urticaria and shortness of breath. 8. Cholecystectomy 1994. 9. Right ganglion cyst removed in 1979. 10.Longstanding smoking history though she quit in March 2007. 11.Appendectomy 1963. 12.Bell's palsy on the right in 1991 with complete resolution. 13.Stent placement in 2006.  [name] [name] has done well and she had blood work by Dr. Juanetta Gosling back in April which she  brought and it is still excellent.  Her liver enzymes are perfectly normal but that does not mean she could not potentially have liver damage from her obesity.  There is not a recommended screening program for the liver at this point in time.  So I talked to her extensively about the future and I really think her obesity is one of her major illnesses going forward.  Other than that she has done very well and oncologic review of systems is negative.  She is probably due for colonoscopy at some point.  Dr. Lisbeth Renshaw office will be contacted to see when she had it last.  She was told 10 years but we just do not have any records at this time.  PHYSICAL EXAMINATION:  She is 257 pounds on a 5 foot 5 inch frame.  Her blood pressure 161/90 right arm sitting position.  Pulse 80-88 and regular, respirations 16 and unlabored.  She is afebrile.  Lymph nodes are negative throughout.  Both breasts are negative for any masses.  Her lungs are clear.  Heart shows a regular rhythm and rate without murmur, rub or gallop.  Abdomen is obese, nontender.  I thought I might feel her liver edge about 2-3  cm below the costal margin on the right but I could not be sure of her overall span by percussion and palpation.  I did not feel a distinct spleen tip.  Bowel sounds were normal.  She has no peripheral edema.  She states her bowel function is much improved since she has been eating a better diet but she has a major issue realistically with her weight.  From the standpoint of her breast cancer there is no evidence for recurrence.  We will see her back in a year, sooner if need be, and I have told her to call us if she has any questions.    ______________________________ Ladona Horns. Mariel Sleet, MD ESN/MEDQ  D:  10/28/2011  T:  10/28/2011  Job:  [id number]

## 2011-10-28 NOTE — Progress Notes (Signed)
This office note has been dictated.

## 2011-12-12 ENCOUNTER — Telehealth: Payer: Self-pay | Admitting: Cardiovascular Disease

## 2011-12-12 ENCOUNTER — Ambulatory Visit: Payer: Managed Care, Other (non HMO) | Admitting: Cardiovascular Disease

## 2011-12-12 ENCOUNTER — Ambulatory Visit (INDEPENDENT_AMBULATORY_CARE_PROVIDER_SITE_OTHER): Payer: Managed Care, Other (non HMO) | Admitting: Cardiovascular Disease

## 2011-12-12 ENCOUNTER — Encounter: Payer: Self-pay | Admitting: Cardiovascular Disease

## 2011-12-12 VITALS — BP 146/89 | HR 81 | Wt 256.0 lb

## 2011-12-12 DIAGNOSIS — I2510 Atherosclerotic heart disease of native coronary artery without angina pectoris: Secondary | ICD-10-CM

## 2011-12-12 DIAGNOSIS — Z8679 Personal history of other diseases of the circulatory system: Secondary | ICD-10-CM

## 2011-12-12 DIAGNOSIS — R079 Chest pain, unspecified: Secondary | ICD-10-CM

## 2011-12-12 DIAGNOSIS — C50919 Malignant neoplasm of unspecified site of unspecified female breast: Secondary | ICD-10-CM

## 2011-12-12 DIAGNOSIS — E785 Hyperlipidemia, unspecified: Secondary | ICD-10-CM

## 2011-12-12 NOTE — Progress Notes (Signed)
Patient ID: [name] [name], female   DOB: 24-Aug-1955, 56 y.o.   MRN: [id number] [name] [name] returns today for follow-up. 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 She has had follow-up breast cancer treatment. She had a lumpectomy and adjuvant therapy including chemo. She has a mamogram scheduled next month and this year marks her 6 year survival She has had previous cardiomegaly. She is a previous smoker with hyperlipidemia and coronary disease. She had a stent to the LAD in May 2006. It was a drug-eluting stent. Marland KitchenHe anginal equivalent in past has been neck shoulder and back pain.  She has had separate left shoulder issues and seen ortho with PT/OT.  Has had more chest/shoulder pain radiating to neck.  Seen in [facility] 3/13 with GI illness.  CXR ok.  Pain in chest and shoulder at rest not exertion.  Has not taken nitro  Reviewed labs from [name] [name] 4/13 LDL 64 and normal LFT;s  ROS: Denies fever, malais, weight loss, blurry vision, decreased visual acuity, cough, sputum, SOB, hemoptysis, pleuritic pain, palpitaitons, heartburn, abdominal pain, melena, lower extremity edema, claudication, or rash.  All other systems reviewed and negative  General: Affect appropriate Healthy:  appears stated age HEENT: normal Neck supple with no adenopathy JVP normal no bruits no thyromegaly Lungs clear with no wheezing and good diaphragmatic motion Heart:  S1/S2 no murmur, no rub, gallop or click PMI normal Abdomen: benighn, BS positve, no tenderness, no AAA no bruit.  No HSM or HJR Distal pulses intact with no bruits No edema Neuro non-focal Skin warm and dry No muscular weakness   Current Outpatient Prescriptions  Medication Sig Dispense Refill  . acetaminophen (TYLENOL) 325 MG tablet Take 325 mg by mouth every 6 (six) hours as needed.        Marland Kitchen aspirin 81 MG tablet Take 81 mg by mouth daily.        . Cholecalciferol (VITAMIN D) 2000 UNITS CAPS Take 1 capsule by mouth daily.      Marland Kitchen docusate sodium (COLACE) 100 MG  capsule Take 100 mg by mouth as needed.      . ezetimibe (ZETIA) 10 MG tablet Take 10 mg by mouth at bedtime.       . fish oil-omega-3 fatty acids 1000 MG capsule Take 2 g by mouth 2 (two) times daily.        Marland Kitchen gemfibrozil (LOPID) 600 MG tablet Take 600 mg by mouth 2 (two) times daily before a meal.      . metoprolol tartrate (LOPRESSOR) 25 MG tablet Take 1 tab daily  90 tablet  3  . Multiple Vitamin (MULTIVITAMIN) capsule Take 0.5 capsules by mouth 2 (two) times daily.       . nitroGLYCERIN (NITROSTAT) 0.4 MG SL tablet Place 1 tablet (0.4 mg total) under the tongue every 5 (five) minutes as needed for chest pain.  90 tablet  3  . rosuvastatin (CRESTOR) 20 MG tablet Take 20 mg by mouth at bedtime.       . vitamin B-12 (CYANOCOBALAMIN) 500 MCG tablet Take 500 mcg by mouth daily.          Allergies  Codeine; Duricef; Pentazocine lactate; Pentazocine lactate; and Vicodin  Electrocardiogram: 08/11/11  NSR LVH rate 106  Assessment and Plan

## 2011-12-12 NOTE — Assessment & Plan Note (Signed)
Well controlled.  Continue current medications and low sodium Dash type diet.    

## 2011-12-12 NOTE — Assessment & Plan Note (Signed)
Symptoms hard to interpret  Previous angina with neck and back pain.  Needs F/U stress myovue.  Continue ASA and beta blocker.  2 day protocol due to weight

## 2011-12-12 NOTE — Telephone Encounter (Signed)
New msg Pt was here today and wanted to talk to you about her meds

## 2011-12-12 NOTE — Patient Instructions (Signed)
Your physician wants you to follow-up in:   YEAR WITH  DR NISHAN You will receive a reminder letter in the mail two months in advance. If you don't receive a letter, please call our office to schedule the follow-up appointment. Your physician recommends that you continue on your current medications as directed. Please refer to the Current Medication list given to you today.  Your physician has requested that you have en exercise stress myoview. For further information please visit [email or web address]. Please follow instruction sheet, as given.  

## 2011-12-12 NOTE — Assessment & Plan Note (Signed)
Cholesterol is at goal.  Continue current dose of statin and diet Rx.  No myalgias or side effects.  F/U  LFT's in 6 months. No results found for this basename: LDLCALC             

## 2011-12-12 NOTE — Assessment & Plan Note (Signed)
Yearly mamogram  F/U oncology no evidence of recurrence last 6 years

## 2011-12-12 NOTE — Telephone Encounter (Signed)
PT CALLING  TO MAKE SURE ONLY TO HOLD LOPRESSOR AM OF  STRESS MYOVIEW  PT AWARE THAT IS ONLY TIME TO HOLD  IS WHEN  COMING IN TO WALK ON TREADMILL./CY

## 2011-12-15 ENCOUNTER — Ambulatory Visit (HOSPITAL_COMMUNITY): Payer: Managed Care, Other (non HMO) | Attending: Cardiology | Admitting: Radiology

## 2011-12-15 VITALS — BP 139/67 | HR 90 | Ht 64.0 in | Wt 255.0 lb

## 2011-12-15 DIAGNOSIS — R079 Chest pain, unspecified: Secondary | ICD-10-CM

## 2011-12-15 DIAGNOSIS — R42 Dizziness and giddiness: Secondary | ICD-10-CM | POA: Insufficient documentation

## 2011-12-15 DIAGNOSIS — Z9221 Personal history of antineoplastic chemotherapy: Secondary | ICD-10-CM | POA: Insufficient documentation

## 2011-12-15 DIAGNOSIS — R002 Palpitations: Secondary | ICD-10-CM | POA: Insufficient documentation

## 2011-12-15 DIAGNOSIS — R0989 Other specified symptoms and signs involving the circulatory and respiratory systems: Secondary | ICD-10-CM | POA: Insufficient documentation

## 2011-12-15 DIAGNOSIS — R5381 Other malaise: Secondary | ICD-10-CM | POA: Insufficient documentation

## 2011-12-15 DIAGNOSIS — I2510 Atherosclerotic heart disease of native coronary artery without angina pectoris: Secondary | ICD-10-CM

## 2011-12-15 DIAGNOSIS — R0789 Other chest pain: Secondary | ICD-10-CM | POA: Insufficient documentation

## 2011-12-15 DIAGNOSIS — R0602 Shortness of breath: Secondary | ICD-10-CM

## 2011-12-15 DIAGNOSIS — R0609 Other forms of dyspnea: Secondary | ICD-10-CM | POA: Insufficient documentation

## 2011-12-15 MED ORDER — TECHNETIUM TC 99M TETROFOSMIN IV KIT
33.0000 | PACK | Freq: Once | INTRAVENOUS | Status: AC | PRN
  Administered 2011-12-15: 33 via INTRAVENOUS

## 2011-12-15 NOTE — Progress Notes (Signed)
[facility] SITE 3 NUCLEAR MED [address] [place] [address] [phone]  Cardiology Nuclear Med Study  [name] [name] is a 56 y.o. female     MRN : [id number]     DOB: [birth year]  Procedure Date: 12/15/2011  Nuclear Med Background Indication for Stress Test:  Evaluation for Ischemia and Stent Patency History:  '06 Stent-LAD, EF=55%; '09 MPS:No ischemia, EF=70%;'09 Echo:55-60%; h/o chemo Cardiac Risk Factors: Family History - CAD, History of Smoking, Lipids and Obesity  Symptoms:  Chest Pressure with and without Exertion, Dizziness, DOE, Fatigue and Palpitations   Nuclear Pre-Procedure Caffeine/Decaff Intake:  None NPO After: 8:00am   Lungs:  Clear. O2 Sat: 100% on room air. IV 0.9% NS with Angio Cath:  20g  IV Site: R Antecubital  IV Started by:  Stanton Kidney, EMT-P  Chest Size (in):  44 Cup Size: DD  Height: 5\' 4"  (1.626 m)  Weight:  255 lb (115.667 kg)  BMI:  Body mass index is 43.77 kg/(m^2). Tech Comments:  Lopressor held > 24 hours, per patient.    Nuclear Med Study 1 or 2 day study: 2 day  Stress Test Type:  Stress  Reading MD: Dietrich Pates, MD  Order Authorizing Provider:  Charlton Haws, IV  Resting Radionuclide: Technetium 36m Tetrofosmin  Resting Radionuclide Dose: 33.0 mCi    On   12-19-11  Stress Radionuclide:  Technetium 45m Tetrofosmin  Stress Radionuclide Dose: 33.0 mCi       On 12-15-11          Stress Protocol Rest HR: 90 Stress HR: 155  Rest BP: 139/67 Stress BP: 180/77  Exercise Time (min): 3:30 METS: 4.6   Predicted Max HR: 162 bpm % Max HR: 95.68 bpm Rate Pressure Product: 21308   Dose of Adenosine (mg):  n/a Dose of Lexiscan: n/a mg  Dose of Atropine (mg): n/a Dose of Dobutamine: n/a mcg/kg/min (at max HR)  Stress Test Technologist: Smiley Houseman, CMA-N  Nuclear Technologist:  Doyne Keel, CNMT     Rest Procedure:  Myocardial perfusion imaging was performed at rest 45 minutes following the intravenous administration of Technetium  103m Tetrofosmin.  Rest ECG: NSR  Stress Procedure:  The patient exercised on the treadmill utilizing the Bruce protocol for 3:30 minutes. She then stopped due to fatigue and denied any chest pain.  There were no diagnostic ST-T wave changes.  Technetium 74m Tetrofosmin was injected at peak exercise and myocardial perfusion imaging was performed after a brief delay.  Stress ECG: No significant change from baseline ECG  QPS Raw Data Images:  Images were motion corrected.  Extensive soft tissue (diaphragm, breast, subcutaneous fat) surround heart. Stress Images:  Normal homogeneous uptake in all areas of the myocardium. Rest Images:  Normal homogeneous uptake in all areas of the myocardium. Subtraction (SDS):  No evidence of ischemia. Transient Ischemic Dilatation (Normal <1.22):  0.76 Lung/Heart Ratio (Normal <0.45):  0.32  Quantitative Gated Spect Images QGS EDV:  81 ml QGS ESV:  27 ml  Impression Exercise Capacity:  Poor exercise capacity. BP Response:  Normal blood pressure response. Clinical Symptoms:  No chest pain. ECG Impression:  No significant ST segment change suggestive of ischemia. Comparison with Prior Nuclear Study: No change from report of 2009  Overall Impression:  Normal stress nuclear study.  LV Ejection Fraction: 67%.  LV Wall Motion:  NL LV Function; NL Wall Motion  Dietrich Pates

## 2011-12-19 ENCOUNTER — Ambulatory Visit (HOSPITAL_COMMUNITY): Payer: Managed Care, Other (non HMO) | Attending: Internal Medicine | Admitting: Radiology

## 2011-12-19 MED ORDER — TECHNETIUM TC 99M TETROFOSMIN IV KIT
30.0000 | PACK | Freq: Once | INTRAVENOUS | Status: AC | PRN
  Administered 2011-12-19: 30 via INTRAVENOUS

## 2011-12-20 ENCOUNTER — Telehealth: Payer: Self-pay | Admitting: Cardiovascular Disease

## 2011-12-20 NOTE — Telephone Encounter (Signed)
Pt was to hear test results today and is at work, wanted to make sure you called her at that number

## 2011-12-20 NOTE — Telephone Encounter (Signed)
PT AWARE OF MYOVIEW RESULTS./CY 

## 2012-01-18 ENCOUNTER — Other Ambulatory Visit: Payer: Self-pay | Admitting: Pulmonary Disease

## 2012-01-18 DIAGNOSIS — Z9889 Other specified postprocedural states: Secondary | ICD-10-CM

## 2012-01-18 DIAGNOSIS — Z853 Personal history of malignant neoplasm of breast: Secondary | ICD-10-CM

## 2012-01-31 ENCOUNTER — Ambulatory Visit
Admission: RE | Admit: 2012-01-31 | Discharge: 2012-01-31 | Disposition: A | Discharge status: Home / Self Care | Payer: Managed Care, Other (non HMO) | Source: Ambulatory Visit | Attending: Pulmonary Disease | Admitting: Pulmonary Disease

## 2012-01-31 DIAGNOSIS — Z9889 Other specified postprocedural states: Secondary | ICD-10-CM

## 2012-01-31 DIAGNOSIS — Z853 Personal history of malignant neoplasm of breast: Secondary | ICD-10-CM

## 2012-03-16 ENCOUNTER — Encounter (HOSPITAL_COMMUNITY): Payer: Self-pay | Admitting: Pharmacy Technician

## 2012-03-22 ENCOUNTER — Other Ambulatory Visit (HOSPITAL_COMMUNITY): Payer: Self-pay | Admitting: Obstetrics & Gynecology

## 2012-03-22 ENCOUNTER — Encounter (HOSPITAL_COMMUNITY)
Admission: RE | Admit: 2012-03-22 | Discharge: 2012-03-22 | Disposition: A | Discharge status: Home / Self Care | Payer: Managed Care, Other (non HMO) | Source: Ambulatory Visit | Attending: Obstetrics & Gynecology | Admitting: Obstetrics & Gynecology

## 2012-03-22 ENCOUNTER — Encounter (HOSPITAL_COMMUNITY): Payer: Self-pay

## 2012-03-22 HISTORY — DX: Other specified postprocedural states: R11.2

## 2012-03-22 HISTORY — DX: Adverse effect of unspecified anesthetic, initial encounter: T41.45XA

## 2012-03-22 HISTORY — DX: Other complications of anesthesia, initial encounter: T88.59XA

## 2012-03-22 HISTORY — DX: Other specified postprocedural states: Z98.890

## 2012-03-22 LAB — COMPREHENSIVE METABOLIC PANEL
ALT: 28 U/L (ref 0–35)
AST: 22 U/L (ref 0–37)
Albumin: 3.6 g/dL (ref 3.5–5.2)
Calcium: 9.8 mg/dL (ref 8.4–10.5)
Sodium: 139 mEq/L (ref 135–145)
Total Protein: 6.3 g/dL (ref 6.0–8.3)

## 2012-03-22 LAB — URINALYSIS, ROUTINE W REFLEX MICROSCOPIC
Bilirubin Urine: NEGATIVE
Hgb urine dipstick: NEGATIVE
Specific Gravity, Urine: 1.015 (ref 1.005–1.030)
pH: 6.5 (ref 5.0–8.0)

## 2012-03-22 LAB — CBC
MCH: 31.5 pg (ref 26.0–34.0)
MCHC: 34 g/dL (ref 30.0–36.0)
Platelets: 267 10*3/uL (ref 150–400)
RBC: 4.44 MIL/uL (ref 3.87–5.11)
RDW: 12.5 % (ref 11.5–15.5)

## 2012-03-22 LAB — SURGICAL PCR SCREEN: MRSA, PCR: NEGATIVE

## 2012-03-22 NOTE — Progress Notes (Signed)
03/22/12 0807  OBSTRUCTIVE SLEEP APNEA  Have you ever been diagnosed with sleep apnea through a sleep study? No  Do you snore loudly (loud enough to be heard through closed doors)?  1  Do you often feel tired, fatigued, or sleepy during the daytime? 0  Has anyone observed you stop breathing during your sleep? 0  Do you have, or are you being treated for high blood pressure? 1  BMI more than 35 kg/m2? 1  Age over 58 years old? 1  Neck circumference greater than 40 cm/18 inches? 0  Gender: 0  Obstructive Sleep Apnea Score 4   Score 4 or greater  Results sent to PCP

## 2012-03-22 NOTE — Patient Instructions (Addendum)
20 Zannie ROSELLA CRANDELL  03/22/2012   Your procedure is scheduled on:  03/28/2012  Report to [facility] at  925  AM.  Call this number if you have problems the morning of surgery: [phone]   Remember:   Do not eat food:After Midnight.  May have clear liquids:until Midnight .    Take these medicines the morning of surgery with A SIP OF WATER: metoprolol   Do not wear jewelry, make-up or nail polish.  Do not wear lotions, powders, or perfumes.  Do not shave 48 hours prior to surgery. Men may shave face and neck.  Do not bring valuables to the hospital.  Contacts, dentures or bridgework may not be worn into surgery.  Leave suitcase in the car. After surgery it may be brought to your room.  For patients admitted to the hospital, checkout time is 11:00 AM the day of discharge.   Patients discharged the day of surgery will not be allowed to drive home.  Name and phone number of your driver: family  Special Instructions: Shower using CHG 2 nights before surgery and the night before surgery.  If you shower the day of surgery use CHG.  Use special wash - you have one bottle of CHG for all showers.  You should use approximately 1/3 of the bottle for each shower.   Please read over the following fact sheets that you were given: Pain Booklet, Coughing and Deep Breathing, MRSA Information, Surgical Site Infection Prevention, Anesthesia Post-op Instructions and Care and Recovery After Surgery Hysteroscopy Hysteroscopy is a procedure used for looking inside the womb (uterus). It may be done for many different reasons, including:  To evaluate abnormal bleeding, fibroid (benign, noncancerous) tumors, polyps, scar tissue (adhesions), and possibly cancer of the uterus.  To look for lumps (tumors) and other uterine growths.  To look for causes of why a woman cannot get pregnant (infertility), causes of recurrent loss of pregnancy (miscarriages), or a lost intrauterine device (IUD).  To perform a  sterilization by blocking the fallopian tubes from inside the uterus. A hysteroscopy should be done right after a menstrual period to be sure you are not pregnant. LET YOUR CAREGIVER KNOW ABOUT:   Allergies.  Medicines taken, including herbs, eyedrops, over-the-counter medicines, and creams.  Use of steroids (by mouth or creams).  Previous problems with anesthetics or numbing medicines.  History of bleeding or blood problems.  History of blood clots.  Possibility of pregnancy, if this applies.  Previous surgery.  Other health problems. RISKS AND COMPLICATIONS   Putting a hole in the uterus.  Excessive bleeding.  Infection.  Damage to the cervix.  Injury to other organs.  Allergic reaction to medicines.  Too much fluid used in the uterus for the procedure. BEFORE THE PROCEDURE   Do not take aspirin or blood thinners for a week before the procedure, or as directed. It can cause bleeding.  Arrive at least 60 minutes before the procedure or as directed to read and sign the necessary forms.  Arrange for someone to take you home after the procedure.  If you smoke, do not smoke for 2 weeks before the procedure. PROCEDURE   Your caregiver may give you medicine to relax you. He or she may also give you a medicine that numbs the area around the cervix (local anesthetic) or a medicine that makes you sleep (general anesthesia).  Sometimes, a medicine is placed in the cervix the day before the procedure. This  medicine makes the cervix have a larger opening (dilate). This makes it easier for the instrument to be inserted into the uterus.  A small instrument (hysteroscope) is inserted through the vagina into the uterus. This instrument is similar to a pencil-sized telescope with a light.  During the procedure, air or a liquid is put into the uterus, which allows the surgeon to see better.  Sometimes, tissue is gently scraped from inside the uterus. These tissue samples are  sent to a specialist who looks at tissue samples (pathologist). The pathologist will give a report to your caregiver. This will help your caregiver decide if further treatment is necessary. The report will also help your caregiver decide on the best treatment if the test comes back abnormal. AFTER THE PROCEDURE   If you had a general anesthetic, you may be groggy for a couple hours after the procedure.  If you had a local anesthetic, you will be advised to rest at the surgical center or caregiver's office until you are stable and feel ready to go home.  You may have some cramping for a couple days.  You may have bleeding, which varies from light spotting for a few days to menstrual-like bleeding for up to 3 to 7 days. This is normal.  Have someone take you home. FINDING OUT THE RESULTS OF YOUR TEST Not all test results are available during your visit. If your test results are not back during the visit, make an appointment with your caregiver to find out the results. Do not assume everything is normal if you have not heard from your caregiver or the medical facility. It is important for you to follow up on all of your test results. HOME CARE INSTRUCTIONS   Do not drive for 24 hours or as instructed.  Only take over-the-counter or prescription medicines for pain, discomfort, or fever as directed by your caregiver.  Do not take aspirin. It can cause or aggravate bleeding.  Do not drive or drink alcohol while taking pain medicine.  You may resume your usual diet.  Do not use tampons, douche, or have sexual intercourse for 2 weeks, or as advised by your caregiver.  Rest and sleep for the first 24 to 48 hours.  Take your temperature twice a day for 4 to 5 days. Write it down. Give these temperatures to your caregiver if they are abnormal (above 98.6 F or 37.0 C).  Take medicines your caregiver has ordered as directed.  Follow your caregiver's advice regarding diet, exercise, lifting,  driving, and general activities.  Take showers instead of baths for 2 weeks, or as recommended by your caregiver.  If you develop constipation:  Take a mild laxative with the advice of your caregiver.  Eat bran foods.  Drink enough water and fluids to keep your urine clear or pale yellow.  Try to have someone with you or available to you for the first 24 to 48 hours, especially if you had a general anesthetic.  Make sure you and your family understand everything about your operation and recovery.  Follow your caregiver's advice regarding follow-up appointments and Pap smears. SEEK MEDICAL CARE IF:   You feel dizzy or lightheaded.  You feel sick to your stomach (nauseous).  You develop abnormal vaginal discharge.  You develop a rash.  You have an abnormal reaction or allergy to your medicine.  You need stronger pain medicine. SEEK IMMEDIATE MEDICAL CARE IF:   Bleeding is heavier than a normal menstrual period or you  have blood clots.  You have an oral temperature above 102 F (38.9 C), not controlled by medicine.  You have increasing cramps or pains not relieved with medicine.  You develop belly (abdominal) pain that does not seem to be related to the same area of earlier cramping and pain.  You pass out.  You develop pain in the tops of your shoulders (shoulder strap areas).  You develop shortness of breath. MAKE SURE YOU:   Understand these instructions.  Will watch your condition.  Will get help right away if you are not doing well or get worse. Document Released: 08/01/2000 Document Revised: 07/18/2011 Document Reviewed: 11/24/2008 [facility] Patient Information 2013 [place], [place]. Dilation and Curettage or Vacuum Curettage Dilation and curettage (D&C) and vacuum curettage are minor procedures. A D&C involves stretching (dilation) the cervix and scraping (curettage) the inside lining of the womb (uterus). During a D&C, tissue is gently scraped from the  inside lining of the uterus. During a vacuum curettage, the lining and tissue in the uterus are removed with the use of gentle suction. Curettage may be performed for diagnostic or therapeutic purposes. As a diagnostic procedure, curettage is performed for the purpose of examining tissues from the uterus. Tissue examination may help determine causes or treatment options for symptoms. A diagnostic curettage may be performed for the following symptoms:  Irregular bleeding in the uterus.  Bleeding with the development of clots.  Spotting between menstrual periods.  Prolonged menstrual periods.  Bleeding after menopause.  No menstrual period (amenorrhea).  A change in size and shape of the uterus. A therapeutic curettage is performed to remove tissue, blood, or a contraceptive device. Therapeutic curettage may be performed for the following conditions:   Removal of an IUD (intrauterine device).  Removal of retained placenta after giving birth. Retained placenta can cause bleeding severe enough to require transfusions or an infection.  Abortion.  Miscarriage.  Removal of polyps inside the uterus.  Removal of uncommon types of fibroids (noncancerous lumps). LET YOUR CAREGIVER KNOW ABOUT:   Allergies to food or medicine.  Medicines taken, including vitamins, herbs, eyedrops, over-the-counter medicines, and creams.  Use of steroids (by mouth or creams).  Previous problems with anesthetics or numbing medicines.  History of bleeding problems or blood clots.  Previous surgery.  Other health problems, including diabetes and kidney problems.  Possibility of pregnancy, if this applies. RISKS AND COMPLICATIONS   Excessive bleeding.  Infection of the uterus.  Damage to the cervix.  Development of scar tissue (adhesions) inside the uterus, later causing abnormal amounts of menstrual bleeding.  Complications from the general anesthetic, if a general anesthetic is  used.  Putting a hole (perforation) in the uterus. This is rare. BEFORE THE PROCEDURE   Eat and drink before the procedure only as directed by your caregiver.  Arrange for someone to take you home. PROCEDURE   This procedure may be done in a hospital, outpatient clinic, or caregiver's office.  You may be given a general anesthetic or a local anesthetic in and around the cervix.  You will lie on your back with your legs in stirrups.  There are two ways in which your cervix can be softened and dilated. These include:  Taking a medicine.  Having thin rods (laminaria) inserted into your cervix.  A curved tool (curette) will scrape cells from the inside lining of the uterus and will then be removed. This procedure usually takes about 15 to 30 minutes. AFTER THE PROCEDURE   You will  rest in the recovery area until you are stable and are ready to go home.  You will need to have someone take you home.  You may feel sick to your stomach (nauseous) or throw up (vomit) if you had general anesthesia.  You may have a sore throat if a tube was placed in your throat during general anesthesia.  You may have light cramping and bleeding for 2 days to 2 weeks after the procedure.  Your uterus needs to make a new lining after the procedure. This may make your next period late. Document Released: 04/25/2005 Document Revised: 07/18/2011 Document Reviewed: 11/21/2008 [facility] Patient Information 2013 [place], [place]. PATIENT INSTRUCTIONS POST-ANESTHESIA  IMMEDIATELY FOLLOWING SURGERY:  Do not drive or operate machinery for the first twenty four hours after surgery.  Do not make any important decisions for twenty four hours after surgery or while taking narcotic pain medications or sedatives.  If you develop intractable nausea and vomiting or a severe headache please notify your doctor immediately.  FOLLOW-UP:  Please make an appointment with your surgeon as instructed. You do not need to follow  up with anesthesia unless specifically instructed to do so.  WOUND CARE INSTRUCTIONS (if applicable):  Keep a dry clean dressing on the anesthesia/puncture wound site if there is drainage.  Once the wound has quit draining you may leave it open to air.  Generally you should leave the bandage intact for twenty four hours unless there is drainage.  If the epidural site drains for more than 36-48 hours please call the anesthesia department.  QUESTIONS?:  Please feel free to call your physician or the hospital operator if you have any questions, and they will be happy to assist you.

## 2012-03-28 ENCOUNTER — Encounter (HOSPITAL_COMMUNITY): Payer: Self-pay | Admitting: *Deleted

## 2012-03-28 ENCOUNTER — Ambulatory Visit (HOSPITAL_COMMUNITY): Payer: Managed Care, Other (non HMO) | Admitting: Anesthesiology

## 2012-03-28 ENCOUNTER — Encounter (HOSPITAL_COMMUNITY)
Admission: RE | Disposition: A | Discharge status: Home / Self Care | Payer: Self-pay | Source: Ambulatory Visit | Attending: Obstetrics & Gynecology

## 2012-03-28 ENCOUNTER — Ambulatory Visit (HOSPITAL_COMMUNITY)
Admission: RE | Admit: 2012-03-28 | Discharge: 2012-03-28 | Disposition: A | Discharge status: Home / Self Care | Payer: Managed Care, Other (non HMO) | Source: Ambulatory Visit | Attending: Obstetrics & Gynecology | Admitting: Obstetrics & Gynecology

## 2012-03-28 ENCOUNTER — Encounter (HOSPITAL_COMMUNITY): Payer: Self-pay | Admitting: Anesthesiology

## 2012-03-28 DIAGNOSIS — Z9889 Other specified postprocedural states: Secondary | ICD-10-CM

## 2012-03-28 DIAGNOSIS — R9389 Abnormal findings on diagnostic imaging of other specified body structures: Secondary | ICD-10-CM | POA: Insufficient documentation

## 2012-03-28 DIAGNOSIS — N950 Postmenopausal bleeding: Secondary | ICD-10-CM | POA: Insufficient documentation

## 2012-03-28 DIAGNOSIS — I10 Essential (primary) hypertension: Secondary | ICD-10-CM | POA: Insufficient documentation

## 2012-03-28 HISTORY — PX: HYSTEROSCOPY WITH D & C: SHX1775

## 2012-03-28 SURGERY — DILATATION AND CURETTAGE /HYSTEROSCOPY
Anesthesia: General | Wound class: Clean Contaminated

## 2012-03-28 MED ORDER — MIDAZOLAM HCL 2 MG/2ML IJ SOLN
INTRAMUSCULAR | Status: AC
  Filled 2012-03-28: qty 2

## 2012-03-28 MED ORDER — CIPROFLOXACIN IN D5W 400 MG/200ML IV SOLN
INTRAVENOUS | Status: DC | PRN
  Administered 2012-03-28: 400 mg via INTRAVENOUS

## 2012-03-28 MED ORDER — LIDOCAINE HCL 1 % IJ SOLN
INTRAMUSCULAR | Status: DC | PRN
  Administered 2012-03-28: 50 mg via INTRADERMAL

## 2012-03-28 MED ORDER — FENTANYL CITRATE 0.05 MG/ML IJ SOLN
INTRAMUSCULAR | Status: DC | PRN
  Administered 2012-03-28 (×2): 50 ug via INTRAVENOUS

## 2012-03-28 MED ORDER — SODIUM CHLORIDE 0.9 % IR SOLN
Status: DC | PRN
  Administered 2012-03-28: 3000 mL

## 2012-03-28 MED ORDER — MIDAZOLAM HCL 2 MG/2ML IJ SOLN
1.0000 mg | INTRAMUSCULAR | Status: DC | PRN
  Administered 2012-03-28 (×2): 2 mg via INTRAVENOUS

## 2012-03-28 MED ORDER — KETOROLAC TROMETHAMINE 10 MG PO TABS: 10.0000 mg | ORAL_TABLET | Freq: Three times a day (TID) | ORAL | Status: DC | PRN

## 2012-03-28 MED ORDER — SUCCINYLCHOLINE CHLORIDE 20 MG/ML IJ SOLN
INTRAMUSCULAR | Status: DC | PRN
  Administered 2012-03-28: 180 mg via INTRAVENOUS

## 2012-03-28 MED ORDER — 0.9 % SODIUM CHLORIDE (POUR BTL) OPTIME
TOPICAL | Status: DC | PRN
  Administered 2012-03-28: 1000 mL

## 2012-03-28 MED ORDER — CIPROFLOXACIN IN D5W 400 MG/200ML IV SOLN
INTRAVENOUS | Status: AC
  Filled 2012-03-28: qty 200

## 2012-03-28 MED ORDER — ONDANSETRON HCL 8 MG PO TABS: 8.0000 mg | ORAL_TABLET | Freq: Three times a day (TID) | ORAL | Status: DC | PRN

## 2012-03-28 MED ORDER — ONDANSETRON HCL 4 MG/2ML IJ SOLN: 4.0000 mg | Freq: Once | INTRAMUSCULAR | Status: DC | PRN

## 2012-03-28 MED ORDER — MIDAZOLAM HCL 5 MG/5ML IJ SOLN
INTRAMUSCULAR | Status: DC | PRN
  Administered 2012-03-28: 2 mg via INTRAVENOUS

## 2012-03-28 MED ORDER — ONDANSETRON HCL 4 MG/2ML IJ SOLN
INTRAMUSCULAR | Status: AC
  Filled 2012-03-28: qty 2

## 2012-03-28 MED ORDER — KETOROLAC TROMETHAMINE 30 MG/ML IJ SOLN
INTRAMUSCULAR | Status: AC
  Filled 2012-03-28: qty 1

## 2012-03-28 MED ORDER — HYDROMORPHONE HCL 2 MG PO TABS: 2.0000 mg | ORAL_TABLET | ORAL | Status: DC | PRN

## 2012-03-28 MED ORDER — CIPROFLOXACIN IN D5W 400 MG/200ML IV SOLN: 400.0000 mg | INTRAVENOUS | Status: DC

## 2012-03-28 MED ORDER — KETOROLAC TROMETHAMINE 30 MG/ML IJ SOLN
30.0000 mg | Freq: Once | INTRAMUSCULAR | Status: AC
  Administered 2012-03-28: 30 mg via INTRAVENOUS

## 2012-03-28 MED ORDER — FENTANYL CITRATE 0.05 MG/ML IJ SOLN
INTRAMUSCULAR | Status: AC
  Filled 2012-03-28: qty 2

## 2012-03-28 MED ORDER — CLINDAMYCIN PHOSPHATE 900 MG/50ML IV SOLN
900.0000 mg | INTRAVENOUS | Status: AC
  Administered 2012-03-28: 900 mg via INTRAVENOUS

## 2012-03-28 MED ORDER — LACTATED RINGERS IV SOLN
INTRAVENOUS | Status: DC
  Administered 2012-03-28: 1000 mL via INTRAVENOUS

## 2012-03-28 MED ORDER — SUCCINYLCHOLINE CHLORIDE 20 MG/ML IJ SOLN
INTRAMUSCULAR | Status: AC
  Filled 2012-03-28: qty 1

## 2012-03-28 MED ORDER — ONDANSETRON HCL 4 MG/2ML IJ SOLN
4.0000 mg | Freq: Once | INTRAMUSCULAR | Status: AC
  Administered 2012-03-28: 4 mg via INTRAVENOUS

## 2012-03-28 MED ORDER — PROPOFOL 10 MG/ML IV BOLUS
INTRAVENOUS | Status: DC | PRN
  Administered 2012-03-28: 180 mg via INTRAVENOUS

## 2012-03-28 MED ORDER — CLINDAMYCIN PHOSPHATE 900 MG/50ML IV SOLN
INTRAVENOUS | Status: DC | PRN
  Administered 2012-03-28: 900 mg via INTRAVENOUS

## 2012-03-28 MED ORDER — CLINDAMYCIN PHOSPHATE 900 MG/50ML IV SOLN
INTRAVENOUS | Status: AC
  Filled 2012-03-28: qty 50

## 2012-03-28 MED ORDER — FENTANYL CITRATE 0.05 MG/ML IJ SOLN: 25.0000 ug | INTRAMUSCULAR | Status: DC | PRN

## 2012-03-28 SURGICAL SUPPLY — 22 items
BAG HAMPER (MISCELLANEOUS) ×2 IMPLANT
CLOTH BEACON ORANGE TIMEOUT ST (SAFETY) ×2 IMPLANT
COVER LIGHT HANDLE STERIS (MISCELLANEOUS) ×4 IMPLANT
FORMALIN 10 PREFIL 120ML (MISCELLANEOUS) ×2 IMPLANT
GAUZE SPONGE 4X4 16PLY XRAY LF (GAUZE/BANDAGES/DRESSINGS) ×2 IMPLANT
GLOVE BIOGEL PI IND STRL 8 (GLOVE) ×1 IMPLANT
GLOVE BIOGEL PI INDICATOR 8 (GLOVE) ×1
GLOVE ECLIPSE 8.0 STRL XLNG CF (GLOVE) ×2 IMPLANT
GOWN STRL REIN XL XLG (GOWN DISPOSABLE) ×4 IMPLANT
INST SET HYSTEROSCOPY (KITS) ×2 IMPLANT
IV NS IRRIG 3000ML ARTHROMATIC (IV SOLUTION) ×2 IMPLANT
KIT ROOM TURNOVER APOR (KITS) ×2 IMPLANT
MANIFOLD NEPTUNE II (INSTRUMENTS) ×2 IMPLANT
NS IRRIG 1000ML POUR BTL (IV SOLUTION) ×2 IMPLANT
PACK BASIC III (CUSTOM PROCEDURE TRAY) ×2
PACK SRG BSC III STRL LF ECLPS (CUSTOM PROCEDURE TRAY) ×1 IMPLANT
PAD ARMBOARD 7.5X6 YLW CONV (MISCELLANEOUS) ×2 IMPLANT
PAD TELFA 3X4 1S STER (GAUZE/BANDAGES/DRESSINGS) ×2 IMPLANT
SET IRRIG Y TYPE TUR BLADDER L (SET/KITS/TRAYS/PACK) ×2 IMPLANT
SHEET LAVH (DRAPES) ×2 IMPLANT
TOWEL OR 17X26 4PK STRL BLUE (TOWEL DISPOSABLE) ×2 IMPLANT
YANKAUER SUCT BULB TIP 10FT TU (MISCELLANEOUS) ×2 IMPLANT

## 2012-03-28 NOTE — Op Note (Signed)
Preoperative diagnosis: Post menopausal bleeding                                        Thickened endometrial stripe  Postoperative diagnoses: Same as above   Procedure: Hysteroscopy, uterine curettage  Surgeon: Despina Hidden MD  Anesthesia: Laryngeal mask airway  Findings: The endometrium was normal. There were no fibroid or other abnormalities.  Description of operation: The patient was taken to the operating room and placed in the supine position. She underwent general anesthesia using the laryngeal mask airway. She was placed in the dorsal lithotomy position and prepped and draped in the usual sterile fashion. A Graves speculum was placed and the anterior cervical lip was grasped with a single-tooth tenaculum. The cervix was dilated serially to allow passage of the hysteroscope. Diagnostic hysteroscopy was performed and was found to be normal. A vigorous uterine curettage was then performed and all tissue sent to pathology for evaluation. The patient was awakened from anesthesia and taken to the recovery room in good stable condition all counts were correct. She received Cleocin and Cipro and 30 mg of Toradol preoperatively. She will be discharged from the recovery room and followed up in the office in 2 weeks.  EURE,LUTHER H 03/28/2012 1:52 PM

## 2012-03-28 NOTE — Anesthesia Postprocedure Evaluation (Signed)
  Anesthesia Post-op Note  Patient: [name] [name]  Procedure(s) Performed: Procedure(s) (LRB) with comments: DILATATION AND CURETTAGE /HYSTEROSCOPY (N/A)  Patient Location: PACU  Anesthesia Type:General  Level of Consciousness: awake, alert , oriented and patient cooperative  Airway and Oxygen Therapy: Patient Spontanous Breathing  Post-op Pain: none  Post-op Assessment: Post-op Vital signs reviewed, Patient's Cardiovascular Status Stable, Respiratory Function Stable, Patent Airway, No signs of Nausea or vomiting and Pain level controlled  Post-op Vital Signs: Reviewed and stable  Complications: No apparent anesthesia complications

## 2012-03-28 NOTE — Anesthesia Preprocedure Evaluation (Addendum)
Anesthesia Evaluation  Patient identified by MRN, date of birth, ID band Patient awake    Reviewed: Allergy & Precautions, H&P , NPO status , Patient's Chart, lab work & pertinent test results  History of Anesthesia Complications (+) PONV and AWARENESS UNDER ANESTHESIA  Airway Mallampati: I TM Distance: >3 FB     Dental  (+) Teeth Intact   Pulmonary PE breath sounds clear to auscultation        Cardiovascular hypertension, + CAD and + Past MI Rhythm:Regular Rate:Normal     Neuro/Psych    GI/Hepatic GERD-  ,  Endo/Other  Morbid obesity  Renal/GU      Musculoskeletal   Abdominal   Peds  Hematology   Anesthesia Other Findings   Reproductive/Obstetrics                          Anesthesia Physical Anesthesia Plan  ASA: III  Anesthesia Plan: General   Post-op Pain Management:    Induction: Intravenous, Rapid sequence and Cricoid pressure planned  Airway Management Planned: Oral ETT  Additional Equipment:   Intra-op Plan:   Post-operative Plan: Extubation in OR  Informed Consent: I have reviewed the patients History and Physical, chart, labs and discussed the procedure including the risks, benefits and alternatives for the proposed anesthesia with the patient or authorized representative who has indicated his/her understanding and acceptance.     Plan Discussed with:   Anesthesia Plan Comments:         Anesthesia Quick Evaluation

## 2012-03-28 NOTE — H&P (Signed)
[name] [name] is an 56 y.o. female. Post menopausal with uterine bleeding and sonogram reveals thickened endometrial stripe of 12 mm.  History of breast cancer certainly puts her at risk for endometrial cancer so we will proceed with a formal uterine curettage for full evaluation.  No LMP recorded. Patient is postmenopausal.    Past Medical History  Diagnosis Date  . CAD (coronary artery disease) 2006    DES to LAD  . Chest pain   . HTN (hypertension)   . Hyperlipidemia   . GERD (gastroesophageal reflux disease)   . Edema     legs  . Anserine bursitis   . Knee pain   . Cardiomegaly   . Pulmonary embolism   . Kidney stones   . Complication of anesthesia   . PONV (postoperative nausea and vomiting)   . MI (myocardial infarction) 2005  . Breast cancer     left lumpectomy    Past Surgical History  Procedure Date  . Colonoscopy with cold snare and snare cautery polypectomy   . Cardiac catheterization     DES to LAD  . Cholecystectomy   . Lymphadenectomy   . Appendectomy   . Tonsilectomy, adenoidectomy, bilateral myringotomy and tubes   . Cystectomy     rt. palm  . C/s tubal ligation   . Stent implant   . Tonsillectomy   . Coronary angioplasty 2005    1 stent  . Breast lumpectomy     left  . Tubal ligation     with c-section    Family History  Problem Relation Age of Onset  . Coronary artery disease    . Thrombophlebitis    . Arthritis    . Cancer    . Lung disease    . Asthma    . Diabetes      Social History:  reports that she quit smoking about 8 years ago. Her smoking use included Cigarettes. She has a 52.5 pack-year smoking history. She does not have any smokeless tobacco history on file. She reports that she does not drink alcohol or use illicit drugs.  Allergies:  Allergies  Allergen Reactions  . Codeine     vomiting  . Duricef (Cefadroxil Monohydrate)     Rash/ respiratory distress   . Pentazocine Lactate   . Pentazocine Lactate     Vomiting    . Vicodin (Hydrocodone-Acetaminophen)     Nausea, swelling, difficulty breathing     Prescriptions prior to admission  Medication Sig Dispense Refill  . aspirin EC 81 MG tablet Take 81 mg by mouth daily.      . Cholecalciferol (VITAMIN D) 2000 UNITS CAPS Take 1 capsule by mouth daily.      Marland Kitchen docusate sodium (COLACE) 100 MG capsule Take 100 mg by mouth daily as needed. constipation      . ezetimibe (ZETIA) 10 MG tablet Take 10 mg by mouth at bedtime.       . fish oil-omega-3 fatty acids 1000 MG capsule Take 2 g by mouth 2 (two) times daily.        Marland Kitchen gemfibrozil (LOPID) 600 MG tablet Take 600 mg by mouth 2 (two) times daily before a meal.      . loratadine (CLARITIN) 10 MG tablet Take 10 mg by mouth daily as needed.      . metoprolol tartrate (LOPRESSOR) 25 MG tablet Take 25 mg by mouth daily. Take 1 tab daily      . Multiple Vitamin (MULTIVITAMIN) capsule  Take 0.5 capsules by mouth 2 (two) times daily.       . rosuvastatin (CRESTOR) 20 MG tablet Take 20 mg by mouth at bedtime.       . vitamin B-12 (CYANOCOBALAMIN) 500 MCG tablet Take 500 mcg by mouth daily.        . nitroGLYCERIN (NITROSTAT) 0.4 MG SL tablet Place 1 tablet (0.4 mg total) under the tongue every 5 (five) minutes as needed for chest pain.  90 tablet  3  . nitroGLYCERIN (NITROSTAT) 0.4 MG SL tablet Place 0.4 mg under the tongue every 5 (five) minutes as needed. As needed for chest pain        ROS  Review of Systems  Constitutional: Negative for fever, chills, weight loss, malaise/fatigue and diaphoresis.  HENT: Negative for hearing loss, ear pain, nosebleeds, congestion, sore throat, neck pain, tinnitus and ear discharge.   Eyes: Negative for blurred vision, double vision, photophobia, pain, discharge and redness.  Respiratory: Negative for cough, hemoptysis, sputum production, shortness of breath, wheezing and stridor.   Cardiovascular: Negative for chest pain, palpitations, orthopnea, claudication, leg swelling and PND.    Gastrointestinal: Negative for abdominal pain. Negative for heartburn, nausea, vomiting, diarrhea, constipation, blood in stool and melena.  Genitourinary: Negative for dysuria, urgency, frequency, hematuria and flank pain.  Musculoskeletal: Negative for myalgias, back pain, joint pain and falls.  Skin: Negative for itching and rash.  Neurological: Negative for dizziness, tingling, tremors, sensory change, speech change, focal weakness, seizures, loss of consciousness, weakness and headaches.  Endo/Heme/Allergies: Negative for environmental allergies and polydipsia. Does not bruise/bleed easily.  Psychiatric/Behavioral: Negative for depression, suicidal ideas, hallucinations, memory loss and substance abuse. The patient is not nervous/anxious and does not have insomnia.     Blood pressure 152/89, pulse 90, temperature 98.5 F (36.9 C), temperature source Oral, resp. rate 26, SpO2 97.00%. Physical Exam Physical Exam  Vitals reviewed. Constitutional: She is oriented to person, place, and time. She appears well-developed and well-nourished.  HENT:  Head: Normocephalic and atraumatic.  Right Ear: External ear normal.  Left Ear: External ear normal.  Nose: Nose normal.  Mouth/Throat: Oropharynx is clear and moist.  Eyes: Conjunctivae and EOM are normal. Pupils are equal, round, and reactive to light. Right eye exhibits no discharge. Left eye exhibits no discharge. No scleral icterus.  Neck: Normal range of motion. Neck supple. No tracheal deviation present. No thyromegaly present.  Cardiovascular: Normal rate, regular rhythm, normal heart sounds and intact distal pulses.  Exam reveals no gallop and no friction rub.   No murmur heard. Respiratory: Effort normal and breath sounds normal. No respiratory distress. She has no wheezes. She has no rales. She exhibits no tenderness.  GI: Soft. Bowel sounds are normal. She exhibits no distension and no mass. There is tenderness. There is no rebound and  no guarding.  Genitourinary:       Vulva is normal without lesions Vagina is pink moist without discharge Cervix normal in appearance and pap is normal Uterus is normal with thickened strip of 12 mm Adnexa is negative with normal sized post menopausal ovaries by sonogram  Musculoskeletal: Normal range of motion. She exhibits no edema and no tenderness.  Neurological: She is alert and oriented to person, place, and time. She has normal reflexes. She displays normal reflexes. No cranial nerve deficit. She exhibits normal muscle tone. Coordination normal.  Skin: Skin is warm and dry. No rash noted. No erythema. No pallor.  Psychiatric: She has a normal mood and affect.  Her behavior is normal. Judgment and thought content normal.     Recent Results (from the past 336 hour(s))  SURGICAL PCR SCREEN   Collection Time   03/22/12  7:57 AM      Component Value Range   MRSA, PCR NEGATIVE  NEGATIVE   Staphylococcus aureus NEGATIVE  NEGATIVE  URINALYSIS, ROUTINE W REFLEX MICROSCOPIC   Collection Time   03/22/12  7:57 AM      Component Value Range   Color, Urine YELLOW  YELLOW   APPearance CLEAR  CLEAR   Specific Gravity, Urine 1.015  1.005 - 1.030   pH 6.5  5.0 - 8.0   Glucose, UA NEGATIVE  NEGATIVE mg/dL   Hgb urine dipstick NEGATIVE  NEGATIVE   Bilirubin Urine NEGATIVE  NEGATIVE   Ketones, ur NEGATIVE  NEGATIVE mg/dL   Protein, ur NEGATIVE  NEGATIVE mg/dL   Urobilinogen, UA 0.2  0.0 - 1.0 mg/dL   Nitrite NEGATIVE  NEGATIVE   Leukocytes, UA NEGATIVE  NEGATIVE  CBC   Collection Time   03/22/12  8:20 AM      Component Value Range   WBC 4.7  4.0 - 10.5 K/uL   RBC 4.44  3.87 - 5.11 MIL/uL   Hemoglobin 14.0  12.0 - 15.0 g/dL   HCT 16.1  09.6 - 04.5 %   MCV 92.8  78.0 - 100.0 fL   MCH 31.5  26.0 - 34.0 pg   MCHC 34.0  30.0 - 36.0 g/dL   RDW 40.9  81.1 - 91.4 %   Platelets 267  150 - 400 K/uL  COMPREHENSIVE METABOLIC PANEL   Collection Time   03/22/12  8:20 AM      Component Value  Range   Sodium 139  135 - 145 mEq/L   Potassium 4.4  3.5 - 5.1 mEq/L   Chloride 104  96 - 112 mEq/L   CO2 28  19 - 32 mEq/L   Glucose, Bld 123 (*) 70 - 99 mg/dL   BUN 16  6 - 23 mg/dL   Creatinine, Ser 7.82  0.50 - 1.10 mg/dL   Calcium 9.8  8.4 - 95.6 mg/dL   Total Protein 6.3  6.0 - 8.3 g/dL   Albumin 3.6  3.5 - 5.2 g/dL   AST 22  0 - 37 U/L   ALT 28  0 - 35 U/L   Alkaline Phosphatase 77  39 - 117 U/L   Total Bilirubin 0.4  0.3 - 1.2 mg/dL   GFR calc non Af Amer >90  >90 mL/min   GFR calc Af Amer >90  >90 mL/min       Assessment/Plan: 1.  Post menopausal bleeding with thickened endometrial stripe  Hysteroscopy and uterine curettage for evaluation of bleeding.  EURE,LUTHER H 03/28/2012, 12:36 PM

## 2012-03-28 NOTE — Transfer of Care (Signed)
Immediate Anesthesia Transfer of Care Note  Patient: [name] [name]  Procedure(s) Performed: Procedure(s) (LRB) with comments: DILATATION AND CURETTAGE /HYSTEROSCOPY (N/A)  Patient Location: PACU  Anesthesia Type:General  Level of Consciousness: awake, alert  and patient cooperative  Airway & Oxygen Therapy: Patient Spontanous Breathing and Patient connected to face mask oxygen  Post-op Assessment: Report given to PACU RN, Post -op Vital signs reviewed and stable and Patient moving all extremities  Post vital signs: Reviewed and stable  Complications: No apparent anesthesia complications

## 2012-03-28 NOTE — Anesthesia Procedure Notes (Signed)
Procedure Name: Intubation Date/Time: 03/28/2012 1:26 PM Performed by: Despina Hidden Pre-anesthesia Checklist: Emergency Drugs available, Suction available, Patient identified and Patient being monitored Patient Re-evaluated:Patient Re-evaluated prior to inductionOxygen Delivery Method: Circle system utilized Preoxygenation: Pre-oxygenation with 100% oxygen Intubation Type: IV induction Ventilation: Mask ventilation without difficulty Laryngoscope Size: Mac and 3 Grade View: Grade I Tube type: Oral Tube size: 7.0 mm Number of attempts: 1 Airway Equipment and Method: Stylet Placement Confirmation: ETT inserted through vocal cords under direct vision,  positive ETCO2 and breath sounds checked- equal and bilateral Secured at: 22 cm Tube secured with: Tape Dental Injury: Teeth and Oropharynx as per pre-operative assessment

## 2012-03-30 ENCOUNTER — Encounter (HOSPITAL_COMMUNITY): Payer: Self-pay | Admitting: Obstetrics & Gynecology

## 2012-10-26 ENCOUNTER — Ambulatory Visit (HOSPITAL_COMMUNITY): Payer: Managed Care, Other (non HMO)

## 2012-10-29 ENCOUNTER — Other Ambulatory Visit: Payer: Self-pay | Admitting: Emergency Medicine

## 2012-10-29 MED ORDER — METOPROLOL TARTRATE 25 MG PO TABS: 25.0000 mg | ORAL_TABLET | Freq: Every day | ORAL | Status: DC

## 2012-11-16 ENCOUNTER — Emergency Department (HOSPITAL_COMMUNITY)
Admission: EM | Admit: 2012-11-16 | Discharge: 2012-11-16 | Disposition: A | Discharge status: Home / Self Care | Payer: Managed Care, Other (non HMO) | Attending: Emergency Medicine | Admitting: Emergency Medicine

## 2012-11-16 ENCOUNTER — Encounter (HOSPITAL_COMMUNITY): Payer: Self-pay

## 2012-11-16 ENCOUNTER — Emergency Department (HOSPITAL_COMMUNITY): Payer: Managed Care, Other (non HMO)

## 2012-11-16 DIAGNOSIS — Z79899 Other long term (current) drug therapy: Secondary | ICD-10-CM | POA: Insufficient documentation

## 2012-11-16 DIAGNOSIS — Z7982 Long term (current) use of aspirin: Secondary | ICD-10-CM | POA: Insufficient documentation

## 2012-11-16 DIAGNOSIS — Z87442 Personal history of urinary calculi: Secondary | ICD-10-CM | POA: Insufficient documentation

## 2012-11-16 DIAGNOSIS — E785 Hyperlipidemia, unspecified: Secondary | ICD-10-CM | POA: Insufficient documentation

## 2012-11-16 DIAGNOSIS — Z86711 Personal history of pulmonary embolism: Secondary | ICD-10-CM | POA: Insufficient documentation

## 2012-11-16 DIAGNOSIS — Z9861 Coronary angioplasty status: Secondary | ICD-10-CM | POA: Insufficient documentation

## 2012-11-16 DIAGNOSIS — I2510 Atherosclerotic heart disease of native coronary artery without angina pectoris: Secondary | ICD-10-CM | POA: Insufficient documentation

## 2012-11-16 DIAGNOSIS — I252 Old myocardial infarction: Secondary | ICD-10-CM | POA: Insufficient documentation

## 2012-11-16 DIAGNOSIS — I10 Essential (primary) hypertension: Secondary | ICD-10-CM | POA: Insufficient documentation

## 2012-11-16 DIAGNOSIS — Y939 Activity, unspecified: Secondary | ICD-10-CM | POA: Insufficient documentation

## 2012-11-16 DIAGNOSIS — Z8679 Personal history of other diseases of the circulatory system: Secondary | ICD-10-CM | POA: Insufficient documentation

## 2012-11-16 DIAGNOSIS — Z8719 Personal history of other diseases of the digestive system: Secondary | ICD-10-CM | POA: Insufficient documentation

## 2012-11-16 DIAGNOSIS — S42292A Other displaced fracture of upper end of left humerus, initial encounter for closed fracture: Secondary | ICD-10-CM

## 2012-11-16 DIAGNOSIS — Z8739 Personal history of other diseases of the musculoskeletal system and connective tissue: Secondary | ICD-10-CM | POA: Insufficient documentation

## 2012-11-16 DIAGNOSIS — Z872 Personal history of diseases of the skin and subcutaneous tissue: Secondary | ICD-10-CM | POA: Insufficient documentation

## 2012-11-16 DIAGNOSIS — Z853 Personal history of malignant neoplasm of breast: Secondary | ICD-10-CM | POA: Insufficient documentation

## 2012-11-16 DIAGNOSIS — Y929 Unspecified place or not applicable: Secondary | ICD-10-CM | POA: Insufficient documentation

## 2012-11-16 DIAGNOSIS — X500XXA Overexertion from strenuous movement or load, initial encounter: Secondary | ICD-10-CM | POA: Insufficient documentation

## 2012-11-16 DIAGNOSIS — S42293A Other displaced fracture of upper end of unspecified humerus, initial encounter for closed fracture: Secondary | ICD-10-CM | POA: Insufficient documentation

## 2012-11-16 DIAGNOSIS — Z87891 Personal history of nicotine dependence: Secondary | ICD-10-CM | POA: Insufficient documentation

## 2012-11-16 DIAGNOSIS — R000 Tachycardia, unspecified: Secondary | ICD-10-CM | POA: Insufficient documentation

## 2012-11-16 DIAGNOSIS — R296 Repeated falls: Secondary | ICD-10-CM | POA: Insufficient documentation

## 2012-11-16 MED ORDER — OXYCODONE-ACETAMINOPHEN 5-325 MG PO TABS: ORAL_TABLET | ORAL | Status: DC

## 2012-11-16 MED ORDER — MORPHINE SULFATE 4 MG/ML IJ SOLN
8.0000 mg | Freq: Once | INTRAMUSCULAR | Status: AC
  Administered 2012-11-16: 8 mg via INTRAMUSCULAR
  Filled 2012-11-16 (×2): qty 1

## 2012-11-16 MED ORDER — PROMETHAZINE HCL 12.5 MG PO TABS
12.5000 mg | ORAL_TABLET | Freq: Once | ORAL | Status: AC
  Administered 2012-11-16: 12.5 mg via ORAL
  Filled 2012-11-16: qty 1

## 2012-11-16 MED ORDER — OXYCODONE-ACETAMINOPHEN 5-325 MG PO TABS
2.0000 | ORAL_TABLET | Freq: Once | ORAL | Status: AC
  Administered 2012-11-16: 2 via ORAL
  Filled 2012-11-16: qty 2

## 2012-11-16 MED ORDER — MELOXICAM 7.5 MG PO TABS: ORAL_TABLET | ORAL | Status: DC

## 2012-11-16 NOTE — ED Notes (Signed)
Pt tripped over a cord and fell onto lt shoulder . Good radial pulse, Pain lt shoulder and upper arm.

## 2012-11-16 NOTE — ED Notes (Signed)
Good radial pulse after immobilizer in place.  Pt has sl nausea at  D/c, says she has zofran at home to take.

## 2012-11-16 NOTE — ED Provider Notes (Signed)
History    CSN: [id number] Arrival date & time 11/16/12  1746  First MD Initiated Contact with Patient 11/16/12 1758     Chief Complaint  Patient presents with  . Arm Injury  . Shoulder Injury   (Consider location/radiation/quality/duration/timing/severity/associated sxs/prior Treatment) HPI Comments: Patient states she still and fell injuring the left shoulder. Patient states she heard a pop and after that she had severe pain and could not move the left shoulder. This event happened 1-2 hours prior to her admission to the emergency department. She denies any other injury. She is not on any platelet altering medications, and she denies any bleeding disorders. She's not had any operations or procedures involving the left shoulder or upper extremity prior to this event.  The history is provided by the patient.   Past Medical History  Diagnosis Date  . CAD (coronary artery disease) 2006    DES to LAD  . Chest pain   . HTN (hypertension)   . Hyperlipidemia   . GERD (gastroesophageal reflux disease)   . Edema     legs  . Anserine bursitis   . Knee pain   . Cardiomegaly   . Pulmonary embolism   . Kidney stones   . Complication of anesthesia   . PONV (postoperative nausea and vomiting)   . MI (myocardial infarction) 2005  . Breast cancer     left lumpectomy   Past Surgical History  Procedure Laterality Date  . Colonoscopy with cold snare and snare cautery polypectomy    . Cardiac catheterization      DES to LAD  . Cholecystectomy    . Lymphadenectomy    . Appendectomy    . Tonsilectomy, adenoidectomy, bilateral myringotomy and tubes    . Cystectomy      rt. palm  . C/s tubal ligation    . Stent implant    . Tonsillectomy    . Coronary angioplasty  2005    1 stent  . Breast lumpectomy      left  . Tubal ligation      with c-section  . Hysteroscopy w/d&c  03/28/2012    Procedure: DILATATION AND CURETTAGE /HYSTEROSCOPY;  Surgeon: Lazaro Arms, MD;  Location: [facility];  Service: Gynecology;  Laterality: N/A;   Family History  Problem Relation Age of Onset  . Coronary artery disease    . Thrombophlebitis    . Arthritis    . Cancer    . Lung disease    . Asthma    . Diabetes     History  Substance Use Topics  . Smoking status: Former Smoker -- 1.50 packs/day for 35 years    Types: Cigarettes    Quit date: 03/22/2004  . Smokeless tobacco: Not on file  . Alcohol Use: No   OB History   Grav Para Term Preterm Abortions TAB SAB Ect Mult Living                 Review of Systems  Constitutional: Negative for activity change.       All ROS Neg except as noted in HPI  HENT: Negative for nosebleeds and neck pain.   Eyes: Negative for photophobia and discharge.  Respiratory: Negative for cough, shortness of breath and wheezing.   Cardiovascular: Positive for chest pain. Negative for palpitations.  Gastrointestinal: Negative for abdominal pain and blood in stool.  Genitourinary: Negative for dysuria, frequency and hematuria.  Musculoskeletal: Positive for arthralgias. Negative for back pain.  Skin:  Negative.   Neurological: Negative for dizziness, seizures and speech difficulty.  Psychiatric/Behavioral: Negative for hallucinations and confusion.    Allergies  Codeine; Duricef; Pentazocine lactate; and Vicodin  Home Medications   Current Outpatient Rx  Name  Route  Sig  Dispense  Refill  . aspirin EC 81 MG tablet   Oral   Take 81 mg by mouth daily.         . Cholecalciferol (VITAMIN D) 2000 UNITS CAPS   Oral   Take 1 capsule by mouth daily.         Marland Kitchen docusate sodium (COLACE) 100 MG capsule   Oral   Take 100 mg by mouth daily as needed. constipation         . ezetimibe (ZETIA) 10 MG tablet   Oral   Take 10 mg by mouth at bedtime.          . fish oil-omega-3 fatty acids 1000 MG capsule   Oral   Take 2 g by mouth 2 (two) times daily.           Marland Kitchen gemfibrozil (LOPID) 600 MG tablet   Oral   Take 600 mg by mouth 2 (two)  times daily before a meal.         . loratadine (CLARITIN) 10 MG tablet   Oral   Take 10 mg by mouth daily as needed.         . metoprolol tartrate (LOPRESSOR) 25 MG tablet   Oral   Take 1 tablet (25 mg total) by mouth daily. Take 1 tab daily   90 tablet   0   . Multiple Vitamin (MULTIVITAMIN) capsule   Oral   Take 0.5 capsules by mouth 2 (two) times daily.          . rosuvastatin (CRESTOR) 20 MG tablet   Oral   Take 20 mg by mouth at bedtime.          . vitamin B-12 (CYANOCOBALAMIN) 500 MCG tablet   Oral   Take 500 mcg by mouth daily.           Marland Kitchen EXPIRED: nitroGLYCERIN (NITROSTAT) 0.4 MG SL tablet   Sublingual   Place 1 tablet (0.4 mg total) under the tongue every 5 (five) minutes as needed for chest pain.   90 tablet   3   . nitroGLYCERIN (NITROSTAT) 0.4 MG SL tablet   Sublingual   Place 0.4 mg under the tongue every 5 (five) minutes as needed. As needed for chest pain          BP 184/87  Pulse 106  Temp(Src) 99.4 F (37.4 C) (Oral)  Resp 24  Ht 5\' 4"  (1.626 m)  Wt 262 lb (118.842 kg)  BMI 44.95 kg/m2  SpO2 94% Physical Exam  Nursing note and vitals reviewed. Constitutional: She is oriented to person, place, and time. She appears well-developed and well-nourished.  Non-toxic appearance.  HENT:  Head: Normocephalic.  Right Ear: Tympanic membrane and external ear normal.  Left Ear: Tympanic membrane and external ear normal.  Eyes: EOM and lids are normal. Pupils are equal, round, and reactive to light.  Neck: Normal range of motion. Neck supple. Carotid bruit is not present.  Cardiovascular: Regular rhythm, normal heart sounds, intact distal pulses and normal pulses.  Tachycardia present.   Pulmonary/Chest: Breath sounds normal. No respiratory distress.  Abdominal: Soft. Bowel sounds are normal. There is no tenderness. There is no guarding.  Musculoskeletal: Normal range of motion.  Arms: Capillary refill of the left hand is less than 3  seconds. There is good range of motion of the fingers of the left upper extremity. There is good range of motion of the left wrist. There is no palpable deformity of the left forearm. There is pain to the left anterior and posterior shoulder. Patient cannot cooperate do to pain and I am unable to examine for dislocation. No visible sign of dislocation. Clavicle is intact and nontender.  Lymphadenopathy:       Head (right side): No submandibular adenopathy present.       Head (left side): No submandibular adenopathy present.    She has no cervical adenopathy.  Neurological: She is alert and oriented to person, place, and time. She has normal strength. No cranial nerve deficit or sensory deficit.  Skin: Skin is warm and dry.  Psychiatric: She has a normal mood and affect. Her speech is normal.    ED Course  Procedures (including critical care time) Labs Reviewed - No data to display No results found. No diagnosis found.  MDM  I have reviewed nursing notes, vital signs, and all appropriate lab and imaging results for this patient. Patient sustained a fall today landing on the left shoulder. States she heard a pop. She is now having severe pain involving the left shoulder.  X-ray of the left shoulder reveals a comminuted fracture of the humeral head, neck, and greater tuberosity with impaction. These findings have been given to the patient. The radial pulse is 2+. Capillary refill of the left hand is less than 2 seconds.  Patient is referred to orthopedics (Dr. Romeo Apple). Patient is fitted with a shoulder immobilizer and ice pack. Prescription for Mobic 2 times daily and Percocet one or 2 tablets every 6 hours as needed for pain given to the patient.  Kathie Dike, PA-C 11/16/12 1933  Kathie Dike, PA-C 11/22/12 1733

## 2012-11-16 NOTE — ED Notes (Signed)
Fell on my left shoulder and heard it pop per pt. It is killing me per pt. I can't move it per pt. Happened about 1.5 hours ago per pt. Patient left arm in sling.

## 2012-11-20 ENCOUNTER — Encounter: Payer: Self-pay | Admitting: Orthopedic Surgery

## 2012-11-20 ENCOUNTER — Ambulatory Visit (INDEPENDENT_AMBULATORY_CARE_PROVIDER_SITE_OTHER): Payer: Managed Care, Other (non HMO) | Admitting: Orthopedic Surgery

## 2012-11-20 VITALS — BP 164/98 | Ht 64.0 in | Wt 271.0 lb

## 2012-11-20 DIAGNOSIS — S42209A Unspecified fracture of upper end of unspecified humerus, initial encounter for closed fracture: Secondary | ICD-10-CM

## 2012-11-20 DIAGNOSIS — S42202A Unspecified fracture of upper end of left humerus, initial encounter for closed fracture: Secondary | ICD-10-CM

## 2012-11-20 NOTE — Patient Instructions (Addendum)
CONTINUE TO WEAR SLING FOR 2 WEEKS

## 2012-11-20 NOTE — Progress Notes (Signed)
Patient ID: [name] [name], female   DOB: July 22, 1955, 57 y.o.   MRN: [id number] Chief Complaint  Patient presents with  . Shoulder Pain    Left shoulder fracture d/t injury 11/16/12    History this is a new problem for this patient known to our practice. She fell at her home tripped over a cord on 11/16/2012. She fractured her left shoulder. X-rays were done at the hospital. The fracture is a NEERcriteria nondisplaed proximal humerus fracture with involvement of the greater tuberosityc. She complains of 7/10 throbbing constant pain with swelling. She has some numbness from previous axillary node dissection for breast cancer  She reacted poorly to Percocet secondary to her codeine allergy with nausea vomiting shaking. She became flushed.  Her review of systems is notable for diarrhea and joint pain seasonal allergies and fever she denies chest pain or shortness of breath.  BP 164/98  Ht 5\' 4"  (1.626 m)  Wt 271 lb (122.925 kg)  BMI 46.49 kg/m2 General appearance is normal, the patient is alert and oriented x3 with normal mood and affect. She ambulates with no assistive devices. Her left shoulder is swollen and tender there is ecchymosis under the skin the skin is intact motion is not tested the elbow hand and wrist motion is normal elbow wrist and hand stability normal muscle tone and strength normal elbow hand and wrist pulses normal decreased sensation along the lateral and upper arm is chronic  The x-ray shows a comminuted fracture of the proximal humerus with greater tuberosity involvement less than a centimeter displacement really measured at 0.5 cm  I recommend nonoperative treatment. Sling for a sling and swathe for 2 weeks. X-ray in 2 weeks.  Continue tramadol for pain

## 2012-11-23 NOTE — ED Provider Notes (Signed)
Medical screening examination/treatment/procedure(s) were performed by non-physician practitioner and as supervising physician I was immediately available for consultation/collaboration.  Donnetta Hutching, MD 11/23/12 1007

## 2012-12-03 ENCOUNTER — Encounter: Payer: Self-pay | Admitting: Orthopedic Surgery

## 2012-12-06 ENCOUNTER — Ambulatory Visit (INDEPENDENT_AMBULATORY_CARE_PROVIDER_SITE_OTHER): Payer: Managed Care, Other (non HMO)

## 2012-12-06 ENCOUNTER — Ambulatory Visit (INDEPENDENT_AMBULATORY_CARE_PROVIDER_SITE_OTHER): Payer: Managed Care, Other (non HMO) | Admitting: Orthopedic Surgery

## 2012-12-06 VITALS — BP 123/71 | Ht 64.0 in | Wt 271.0 lb

## 2012-12-06 DIAGNOSIS — IMO0001 Reserved for inherently not codable concepts without codable children: Secondary | ICD-10-CM

## 2012-12-06 DIAGNOSIS — S4292XD Fracture of left shoulder girdle, part unspecified, subsequent encounter for fracture with routine healing: Secondary | ICD-10-CM

## 2012-12-06 NOTE — Progress Notes (Signed)
Patient ID: [name] [name], female   DOB: Jul 07, 1955, 57 y.o.   MRN: [id number] Chief Complaint  Patient presents with  . Follow-up    2 week recheck on left shoulder fracture with xray.7-11 DOI    X-ray today shows no displacement of the fracture or the greater tuberosity  She is 3 weeks out we can start therapy. Initially we'll try home health therapy with a CNA if not we will go to the hospital  To come back in 8 weeks. She has to be at work 13 weeks from her injury or she will not get paid he may lose her job.

## 2012-12-06 NOTE — Patient Instructions (Addendum)
Will try to arrange home therapy and a CNA for ADL's

## 2012-12-07 ENCOUNTER — Encounter: Payer: Self-pay | Admitting: Orthopedic Surgery

## 2012-12-11 ENCOUNTER — Telehealth: Payer: Self-pay | Admitting: Orthopedic Surgery

## 2012-12-11 NOTE — Telephone Encounter (Signed)
Patient called to inquire about the home physical/occupational therapy - states has not heard from anyone as of yet.  Order faxed to home health?  Please advise patient ph# [phone].

## 2012-12-11 NOTE — Telephone Encounter (Signed)
Returned call to patient, and she said [facility] called her today.

## 2012-12-19 ENCOUNTER — Telehealth: Payer: Self-pay | Admitting: Orthopedic Surgery

## 2012-12-19 NOTE — Telephone Encounter (Signed)
Call received from [facility], Rodell Perna, the coordinating provider for [place] insured members' home care (Ph [phone]).  The initial home care/home therapy order was faxed to [facility], [place] are not in [place] network of participating providers; therefore, per [facility], order has been forwarded to [facility] (Ph # [phone]), and initial evaluation is scheduled for Monday, 12/24/12 for patient, and sooner, if staff available; patient aware.

## 2012-12-20 ENCOUNTER — Telehealth: Payer: Self-pay | Admitting: Orthopedic Surgery

## 2012-12-25 ENCOUNTER — Other Ambulatory Visit: Payer: Self-pay | Admitting: *Deleted

## 2012-12-25 DIAGNOSIS — S4292XD Fracture of left shoulder girdle, part unspecified, subsequent encounter for fracture with routine healing: Secondary | ICD-10-CM

## 2012-12-27 ENCOUNTER — Telehealth: Payer: Self-pay | Admitting: Orthopedic Surgery

## 2012-12-27 ENCOUNTER — Other Ambulatory Visit: Payer: Self-pay | Admitting: *Deleted

## 2012-12-27 DIAGNOSIS — S4292XD Fracture of left shoulder girdle, part unspecified, subsequent encounter for fracture with routine healing: Secondary | ICD-10-CM

## 2012-12-27 NOTE — Telephone Encounter (Signed)
[name]/[facility] needs an order for [name] [name] stating that she needs PT and home health aide.  Please fax the order to  [phone]  With the ID# [id number].  [name] asked if you will call her about an hour after the order is faxed.  Her # [phone] ext [phone]

## 2012-12-27 NOTE — Telephone Encounter (Signed)
Sent referral order to [facility]

## 2012-12-27 NOTE — Telephone Encounter (Signed)
Kristin/[facility] left a message 12/26/12 at 7:38, still working on getting home health for [employer].  The original company decided not to accept her case. Any questions, call Belenda Cruise at [phone] ext [phone]

## 2012-12-27 NOTE — Telephone Encounter (Signed)
12/27/12 Received call back from [name] at [facility] regarding the Physical therapy order; states that the therapy order indicates "ambulatory", which she is saying indicates that patient may not be a candidate for home therapy if she is "ambulatory" and not home-bound.  She states still needs an order or prescription for the home health aide.  Her phone # is [phone], [phone]. 12/27/12 - Patient also called in regard to the home therapy and aid orders; she has been receiving several calls as well, and is very upset; maintains that she has no other support, as her son works 12 hours per day, and her parents are in their 32's and not able to help. Please advise.

## 2013-01-17 ENCOUNTER — Telehealth: Payer: Self-pay | Admitting: Orthopedic Surgery

## 2013-01-17 ENCOUNTER — Other Ambulatory Visit: Payer: Self-pay | Admitting: *Deleted

## 2013-01-17 DIAGNOSIS — S4292XD Fracture of left shoulder girdle, part unspecified, subsequent encounter for fracture with routine healing: Secondary | ICD-10-CM

## 2013-01-17 NOTE — Telephone Encounter (Signed)
Call received from [facility], therapist states will be ready for out-patient therapy by next week; states patient is doing well.  Requesting order for out-patient therapy for [facility].  His direct ph# is [phone]

## 2013-01-17 NOTE — Telephone Encounter (Signed)
Faxed order to [facility]

## 2013-01-21 ENCOUNTER — Ambulatory Visit (HOSPITAL_COMMUNITY)
Admission: RE | Admit: 2013-01-21 | Discharge: 2013-01-21 | Disposition: A | Discharge status: Home / Self Care | Payer: Managed Care, Other (non HMO) | Source: Ambulatory Visit | Attending: Orthopedic Surgery | Admitting: Orthopedic Surgery

## 2013-01-21 DIAGNOSIS — M6281 Muscle weakness (generalized): Secondary | ICD-10-CM | POA: Insufficient documentation

## 2013-01-21 DIAGNOSIS — IMO0001 Reserved for inherently not codable concepts without codable children: Secondary | ICD-10-CM | POA: Insufficient documentation

## 2013-01-21 DIAGNOSIS — I10 Essential (primary) hypertension: Secondary | ICD-10-CM | POA: Insufficient documentation

## 2013-01-21 DIAGNOSIS — M25519 Pain in unspecified shoulder: Secondary | ICD-10-CM | POA: Insufficient documentation

## 2013-01-21 NOTE — Evaluation (Signed)
Occupational Therapy Evaluation  Patient Details  Name: [name] [name] MRN: [id number] Date of Birth: 1956-05-06  Today's Date: 01/21/2013 Time: 6962-9528 OT Time Calculation (min): 30 min OT eval 4132-4401 20' MFR 0272-5366 10'  Visit#: 1 of 12  Re-eval: 02/18/13  Assessment Diagnosis: left proximal humerus fx Next MD Visit: 01/31/13 Romeo Apple Prior Therapy: [facility] - [facility]  Authorization:    Authorization Time Period:    Authorization Visit#:   of     Past Medical History:  Past Medical History  Diagnosis Date  . CAD (coronary artery disease) 2006    DES to LAD  . Chest pain   . HTN (hypertension)   . Hyperlipidemia   . GERD (gastroesophageal reflux disease)   . Edema     legs  . Anserine bursitis   . Knee pain   . Cardiomegaly   . Pulmonary embolism   . Kidney stones   . Complication of anesthesia   . PONV (postoperative nausea and vomiting)   . MI (myocardial infarction) 2005  . Breast cancer     left lumpectomy   Past Surgical History:  Past Surgical History  Procedure Laterality Date  . Colonoscopy with cold snare and snare cautery polypectomy    . Cardiac catheterization      DES to LAD  . Cholecystectomy    . Lymphadenectomy    . Appendectomy    . Tonsilectomy, adenoidectomy, bilateral myringotomy and tubes    . Cystectomy      rt. palm  . C/s tubal ligation    . Stent implant    . Tonsillectomy    . Coronary angioplasty  2005    1 stent  . Breast lumpectomy      left  . Tubal ligation      with c-section  . Hysteroscopy w/d&c  03/28/2012    Procedure: DILATATION AND CURETTAGE /HYSTEROSCOPY;  Surgeon: Lazaro Arms, MD;  Location: [facility];  Service: Gynecology;  Laterality: N/A;    Subjective Symptoms/Limitations Symptoms: S: I had home health in the beginning because my arm was so bad I couldn't bathe or dress myself.  Pertinent History: On 11/16/12, Miss Zeleznik tripped over an electrical cord falling and sustaining a left proximal  shoulder fx. Patient received home health therapy prior coming here to [facility]. Dr. Romeo Apple has referred patient to occupational therapy for evaluation and treatment.  Special Tests: DASH score of 24 with ideal score of 0. Pain Assessment Currently in Pain?: No/denies (will have pain if moving it the wrong way.)  Precautions/Restrictions  Precautions Precautions: None  Balance Screening Balance Screen Has the patient fallen in the past 6 months: Yes How many times?: 1 Has the patient had a decrease in activity level because of a fear of falling? : No Is the patient reluctant to leave their home because of a fear of falling? 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 Dominant Hand: Left Vision - History Baseline Vision: No visual deficits  Cognition/Observation Cognition Overall Cognitive Status: Within Functional Limits for tasks  assessed Arousal/Alertness: Awake/alert Orientation Level: Oriented X4   Additional Assessments LUE Assessment LUE Assessment:  (assessed seated and supine) LUE AROM (degrees) Left Shoulder Flexion:  (130/140) Left Shoulder ABduction:  (133/115) Left Shoulder Internal Rotation:  (90/90) Left Shoulder External Rotation:  (15/35) LUE Strength Left Shoulder Flexion: 3-/5 Left Shoulder ABduction: 3-/5 Left Shoulder Internal Rotation: 3/5 Left Shoulder External Rotation: 2-/5 Palpation Palpation: Min  fascial restrictions in left upper arm, trapezious and scapularis region.      Exercise/Treatments  Manual Therapy Manual Therapy: Myofascial release Myofascial Release: MFR to left upper arm, trapezious, and scapularis region to decrease fascial restrictions and increase joint mobility in a pain free zone.   Occupational Therapy Assessment and Plan OT Assessment and Plan Clinical Impression Statement: A: Patient presents with decreased use of LUE with daily activities s/p left proximal humerus fracture. Deficits include decreased range of motion, strength, functional use, and increased pain, edema, and fascial restrictions. Pt will benefit from skilled therapeutic intervention in order to improve on the following deficits: Increased edema;Increased fascial restricitons;Decreased range of motion;Pain;Decreased strength Rehab Potential: Excellent OT Frequency: Min 2X/week OT Duration: 6 weeks OT Plan: P: Skilled OT intervention to decrease pain and fascial restrictions and increase pain free mobilty, strength, and functional use of LUE with all B/IADLs, leisure, and work activities. Treatment Plan: MFR and manual stretching. PROM and AAROM supine, elevation, row, extension, therapy ball stretches, pro/ret/elev/dep.  Progress as tolerated.    Goals Short Term Goals Time to Complete Short Term Goals: 3 weeks Short Term Goal 1: Patient will be educated on HEP.  Short Term Goal 2: Patient will increase PROM to [facility] to decrease difficulty with donning and doffing shirts.  Short Term Goal 3: Patient will increase shoulder strength to 3+/5 to increase ability to pull pants over hips.  Short Term Goal 4: Patient will have 0/10 pain when completing daily activities.  Short Term Goal 5: Patient will decrease fascial restrictions in left arm to trace to increase ability to reach up overhead. Long Term Goals Time to Complete Long Term Goals: 6 weeks Long Term Goal 1: Patient will return to highest  level of independence with all daily, leisure, and work activities.  Long Term Goal 2: Patient will increase AROM WNL to increase ability to complete daily activities.  Long Term Goal 3: Patient will increase shoulder strength to 4/5 to be able to lift heavy items at work overhead. Long Term Goal 4: Patient will decrease fascial restrictions in left arm to zero  to increase ability to reach up overhead.  Problem List Patient Active Problem List   Diagnosis Date Noted  . Pain in joint, shoulder region 01/21/2013  . Proximal humerus fracture 11/20/2012  . Muscle weakness (generalized) 01/12/2011  . Shoulder pain 01/04/2011  . Neck pain 01/04/2011  . Left tennis elbow 01/04/2011  . CAD (coronary artery disease) 12/10/2010  . Pulmonary embolus 11/26/2010    Class: History of  . BREAST CANCER 12/18/2009  . HYPERLIPIDEMIA 12/18/2009  . CAD 12/18/2009  . GERD 12/18/2009  . EDEMA 12/18/2009  . CHEST PAIN 12/18/2009  . HYPERTENSION, HX OF 12/18/2009  . KNEE PAIN 02/11/2008  . ANSERINE BURSITIS 02/11/2008    End of Session Activity Tolerance: Patient tolerated treatment well General Behavior During Therapy: WFL for tasks assessed/performed OT Plan of Care OT Home Exercise Plan: Patient states that she is presently doing the following for her HEP: Theraband, Dowel rod, shoulder rolls, shoulder retraction, Prone exercises, retraction, towel slides.  OT Patient Instructions: Patient was instructed to continue with already established HEP. Consulted and Agree with Plan of Care: Patient   Limmie Patricia, OTR/L,CBIS   01/21/2013, 4:14 PM  Physician Documentation Your signature is required to indicate approval of the treatment plan as stated above.  Please sign and either send electronically or make a copy of this report for your files and return this physician signed original.  Please mark one 1.__approve of plan  2. ___approve of plan with the following  conditions.   ______________________________                                                          _____________________ Physician Signature                                                                                                             Date

## 2013-01-24 ENCOUNTER — Ambulatory Visit (HOSPITAL_COMMUNITY)
Admission: RE | Admit: 2013-01-24 | Discharge: 2013-01-24 | Disposition: A | Discharge status: Home / Self Care | Payer: Managed Care, Other (non HMO) | Source: Ambulatory Visit | Attending: Pulmonary Disease | Admitting: Pulmonary Disease

## 2013-01-24 DIAGNOSIS — M25512 Pain in left shoulder: Secondary | ICD-10-CM

## 2013-01-24 DIAGNOSIS — M6281 Muscle weakness (generalized): Secondary | ICD-10-CM

## 2013-01-24 NOTE — Progress Notes (Signed)
Occupational Therapy Treatment Patient Details  Name: [name] [name] MRN: [id number] Date of Birth: [birth year]  Today's Date: 01/24/2013 Time: 7829-5621 OT Time Calculation (min): 37 min Manual Therapy 1150-1210 20' Therapeutic exercises 1210-1227 17' Visit#: 2 of 12  Re-eval: 02/18/13     Subjective  S:  The therapy went well at home.  Pain Assessment Currently in Pain?: Yes Pain Score: 2  Pain Location: Shoulder Pain Orientation: Left;Posterior Pain Type: Acute pain  Precautions/Restrictions   progress as tolerated  Exercise/Treatments Supine Protraction: PROM;AAROM;10 reps Horizontal ABduction: PROM;AAROM;10 reps External Rotation: PROM;AAROM;10 reps Internal Rotation: PROM;AAROM;10 reps Flexion: PROM;AAROM;10 reps ABduction: PROM;AAROM;10 reps Seated Elevation: AROM;10 reps Extension: AROM;10 reps Row: AROM;10 reps    Therapy Ball Flexion: 15 reps ABduction: 15 reps Right/Left: 5 reps ROM / Strengthening / Isometric Strengthening   Flexion: Supine;3X3" Extension: Supine;3X3" External Rotation: Supine;3X3" Internal Rotation: Supine;3X3" ABduction: Supine;3X3" ADduction: Supine;3X3"    Manual Therapy Manual Therapy: Myofascial release Myofascial Release: MFR to left upper arm, trapezious, and scapularis region to decrease fascial restrictions and increase joint mobility in a pain free zone.  Occupational Therapy Assessment and Plan OT Assessment and Plan Clinical Impression Statement: A:  Patient progressed to AAROM in supine. OT Plan: P: Add AROM in supine.   Goals Short Term Goals Time to Complete Short Term Goals: 3 weeks Short Term Goal 1: Patient will be educated on HEP.  Short Term Goal 1 Progress: Progressing toward goal Short Term Goal 2: Patient will increase PROM to [facility] to decrease difficulty with donning and doffing shirts.  Short Term Goal 2 Progress: Progressing toward goal Short Term Goal 3: Patient will increase shoulder strength to  3+/5 to increase ability to pull pants over hips.  Short Term Goal 3 Progress: Progressing toward goal Short Term Goal 4: Patient will have 0/10 pain when completing daily activities.  Short Term Goal 4 Progress: Progressing toward goal Short Term Goal 5: Patient will decrease fascial restrictions in left arm to trace to increase ability to reach up overhead. Short Term Goal 5 Progress: Progressing toward goal Long Term Goals Time to Complete Long Term Goals: 6 weeks Long Term Goal 1: Patient will return to highest level of independence with all daily, leisure, and work activities.  Long Term Goal 1 Progress: Progressing toward goal Long Term Goal 2: Patient will increase AROM WNL to increase ability to complete daily activities.  Long Term Goal 2 Progress: Progressing toward goal Long Term Goal 3: Patient will increase shoulder strength to 4/5 to be able to lift heavy items at work overhead. Long Term Goal 3 Progress: Progressing toward goal Long Term Goal 4: Patient will decrease fascial restrictions in left arm to zero  to increase ability to reach up overhead. Long Term Goal 4 Progress: Progressing toward goal  Problem List Patient Active Problem List   Diagnosis Date Noted  . Pain in joint, shoulder region 01/21/2013  . Proximal humerus fracture 11/20/2012  . Muscle weakness (generalized) 01/12/2011  . Shoulder pain 01/04/2011  . Neck pain 01/04/2011  . Left tennis elbow 01/04/2011  . CAD (coronary artery disease) 12/10/2010  . Pulmonary embolus 11/26/2010    Class: History of  . BREAST CANCER 12/18/2009  . HYPERLIPIDEMIA 12/18/2009  . CAD 12/18/2009  . GERD 12/18/2009  . EDEMA 12/18/2009  . CHEST PAIN 12/18/2009  . HYPERTENSION, HX OF 12/18/2009  . KNEE PAIN 02/11/2008  . ANSERINE BURSITIS 02/11/2008    End of Session Activity Tolerance: Patient tolerated treatment well General  Behavior During Therapy: [facility] for tasks assessed/performed  GO    Shirlean Mylar,  OTR/L  01/24/2013, 12:28 PM

## 2013-01-29 ENCOUNTER — Ambulatory Visit (HOSPITAL_COMMUNITY)
Admission: RE | Admit: 2013-01-29 | Discharge: 2013-01-29 | Disposition: A | Discharge status: Home / Self Care | Payer: Managed Care, Other (non HMO) | Source: Ambulatory Visit | Attending: Orthopedic Surgery | Admitting: Orthopedic Surgery

## 2013-01-29 NOTE — Progress Notes (Signed)
Occupational Therapy Treatment Patient Details  Name: [name] [name] MRN: [id number] Date of Birth: [birth year]  Today's Date: 01/29/2013 Time: 4098-1191 OT Time Calculation (min): 50 min Manual Therapy 4782-9562 22' Therapeutic exercises 1308-6578 28'  Visit#: 3 of 12  Re-eval: 02/18/13      Subjective S:  It really hasnt bothered me at all today.  Pain Assessment Currently in Pain?: No/denies Pain Score: 0-No pain  Precautions/Restrictions   progress as tolerated  Exercise/Treatments Supine Protraction: PROM;10 reps;AAROM;12 reps Horizontal ABduction: PROM;10 reps;AAROM;12 reps External Rotation: PROM;10 reps;AAROM;12 reps Internal Rotation: PROM;10 reps;AAROM;12 reps Flexion: PROM;10 reps;AAROM;12 reps ABduction: PROM;10 reps;AAROM;12 reps Seated Elevation: AROM;15 reps Extension: AROM;15 reps Row: AROM;15 reps Therapy Ball Flexion: 20 reps ABduction: 20 reps Right/Left: 5 reps ROM / Strengthening / Isometric Strengthening Wall Wash: 1' Thumb Tacks: 1' Proximal Shoulder Strengthening, Supine: 10 times each with support from therapist for last exercise Prot/Ret//Elev/Dep: 1' Rhythmic Stabilization, Supine: 15" X 2      Manual Therapy Manual Therapy: Myofascial release Myofascial Release: MFR to left upper arm, trapezious, and scapularis region to decrease fascial restrictions and increase joint mobility in a pain free zone.  Occupational Therapy Assessment and Plan OT Assessment and Plan Clinical Impression Statement: A:  Added wall wash. OT Plan: P:  Add AROM in supine.    Goals Short Term Goals Time to Complete Short Term Goals: 3 weeks Short Term Goal 1: Patient will be educated on HEP.  Short Term Goal 1 Progress: Progressing toward goal Short Term Goal 2: Patient will increase PROM to [facility] to decrease difficulty with donning and doffing shirts.  Short Term Goal 2 Progress: Progressing toward goal Short Term Goal 3: Patient will increase shoulder  strength to 3+/5 to increase ability to pull pants over hips.  Short Term Goal 3 Progress: Progressing toward goal Short Term Goal 4: Patient will have 0/10 pain when completing daily activities.  Short Term Goal 4 Progress: Progressing toward goal Short Term Goal 5: Patient will decrease fascial restrictions in left arm to trace to increase ability to reach up overhead. Short Term Goal 5 Progress: Progressing toward goal Long Term Goals Time to Complete Long Term Goals: 6 weeks Long Term Goal 1: Patient will return to highest level of independence with all daily, leisure, and work activities.  Long Term Goal 1 Progress: Progressing toward goal Long Term Goal 2: Patient will increase AROM WNL to increase ability to complete daily activities.  Long Term Goal 2 Progress: Progressing toward goal Long Term Goal 3: Patient will increase shoulder strength to 4/5 to be able to lift heavy items at work overhead. Long Term Goal 3 Progress: Progressing toward goal Long Term Goal 4: Patient will decrease fascial restrictions in left arm to zero  to increase ability to reach up overhead. Long Term Goal 4 Progress: Progressing toward goal  Problem List Patient Active Problem List   Diagnosis Date Noted  . Pain in joint, shoulder region 01/21/2013  . Proximal humerus fracture 11/20/2012  . Muscle weakness (generalized) 01/12/2011  . Shoulder pain 01/04/2011  . Neck pain 01/04/2011  . Left tennis elbow 01/04/2011  . CAD (coronary artery disease) 12/10/2010  . Pulmonary embolus 11/26/2010    Class: History of  . BREAST CANCER 12/18/2009  . HYPERLIPIDEMIA 12/18/2009  . CAD 12/18/2009  . GERD 12/18/2009  . EDEMA 12/18/2009  . CHEST PAIN 12/18/2009  . HYPERTENSION, HX OF 12/18/2009  . KNEE PAIN 02/11/2008  . ANSERINE BURSITIS 02/11/2008    End  of Session Activity Tolerance: Patient tolerated treatment well General Behavior During Therapy: [facility] for tasks assessed/performed  GO    Shirlean Mylar, OTR/L  01/29/2013, 4:44 PM

## 2013-01-31 ENCOUNTER — Encounter: Payer: Self-pay | Admitting: Orthopedic Surgery

## 2013-01-31 ENCOUNTER — Ambulatory Visit (INDEPENDENT_AMBULATORY_CARE_PROVIDER_SITE_OTHER): Payer: Self-pay | Admitting: Orthopedic Surgery

## 2013-01-31 VITALS — BP 154/84 | Ht 64.0 in | Wt 271.0 lb

## 2013-01-31 DIAGNOSIS — S42309D Unspecified fracture of shaft of humerus, unspecified arm, subsequent encounter for fracture with routine healing: Secondary | ICD-10-CM

## 2013-01-31 DIAGNOSIS — S42202D Unspecified fracture of upper end of left humerus, subsequent encounter for fracture with routine healing: Secondary | ICD-10-CM

## 2013-01-31 NOTE — Patient Instructions (Addendum)
Continue exercises with therapist

## 2013-01-31 NOTE — Progress Notes (Signed)
Patient ID: [name] [name], female   DOB: 1956-04-24, 57 y.o.   MRN: [id number]   Chief Complaint  Patient presents with  . Follow-up    8 week follow up left shoulder fracture DOI 11/16/12    This is asked to 10 week followup from her initial injury she is doing much better but she still having difficulty with forward elevation and weakness in the left shoulder so recommend continued therapy she's ready to go back to work on Monday 5 hours a day should do that for 2 weeks and then reassess the situation for possibly returning to 9 hour day after that  Follow up with me in 2 months

## 2013-02-01 ENCOUNTER — Ambulatory Visit (HOSPITAL_COMMUNITY): Payer: Managed Care, Other (non HMO)

## 2013-02-04 ENCOUNTER — Ambulatory Visit (HOSPITAL_COMMUNITY)
Admission: RE | Admit: 2013-02-04 | Discharge: 2013-02-04 | Disposition: A | Discharge status: Home / Self Care | Payer: Managed Care, Other (non HMO) | Source: Ambulatory Visit | Attending: Pulmonary Disease | Admitting: Pulmonary Disease

## 2013-02-04 NOTE — Progress Notes (Signed)
Occupational Therapy Treatment Patient Details  Name: [name] [name] MRN: [id number] Date of Birth: 1956-03-15  Today's Date: 02/04/2013 Time: 1308-6578 OT Time Calculation (min): 43 min MFR 1347-1400 13' Therex 1400-1430 30'  Visit#: 4 of 12  Re-eval: 02/18/13    Authorization:    Authorization Time Period:    Authorization Visit#:   of    Subjective Symptoms/Limitations Symptoms: S: Today was the first day back to work I was defintely tired at the end of the shift.  Pain Assessment Currently in Pain?: Yes Pain Score: 2  Pain Location: Shoulder Pain Orientation: Left Pain Type: Acute pain  Precautions/Restrictions  Precautions Precautions: None  Exercise/Treatments Supine Protraction: PROM;AROM;10 reps Horizontal ABduction: PROM;AROM;10 reps External Rotation: PROM;AROM;10 reps Internal Rotation: PROM;AROM;10 reps Flexion: PROM;AROM;10 reps ABduction: PROM;AROM;10 reps Seated Elevation: AROM;15 reps Extension: AROM;15 reps Row: AROM;15 reps Therapy Ball Flexion: 20 reps ABduction: 20 reps Right/Left: 5 reps ROM / Strengthening / Isometric Strengthening Wall Wash: 1' Thumb Tacks: 1' Proximal Shoulder Strengthening, Supine: 10 times  Rhythmic Stabilization, Supine: 15" X 2       Manual Therapy Manual Therapy: Myofascial release Myofascial Release: MFR to left upper arm, trapezious, and scapularis region to decrease fascial restrictions and increase joint mobility in a pain free zone.  Occupational Therapy Assessment and Plan OT Assessment and Plan Clinical Impression Statement: A: Added AROM supine. Patient tolerated well with some difficulty performing abduction and horizontal abduction due to pain.   OT Plan: P: Add AAROM seated and UBE bike.    Goals Short Term Goals Time to Complete Short Term Goals: 3 weeks Short Term Goal 1: Patient will be educated on HEP.  Short Term Goal 1 Progress: Progressing toward goal Short Term Goal 2: Patient will  increase PROM to [facility] to decrease difficulty with donning and doffing shirts.  Short Term Goal 2 Progress: Progressing toward goal Short Term Goal 3: Patient will increase shoulder strength to 3+/5 to increase ability to pull pants over hips.  Short Term Goal 3 Progress: Progressing toward goal Short Term Goal 4: Patient will have 0/10 pain when completing daily activities.  Short Term Goal 4 Progress: Progressing toward goal Short Term Goal 5: Patient will decrease fascial restrictions in left arm to trace to increase ability to reach up overhead. Short Term Goal 5 Progress: Progressing toward goal Long Term Goals Time to Complete Long Term Goals: 6 weeks Long Term Goal 1: Patient will return to highest level of independence with all daily, leisure, and work activities.  Long Term Goal 1 Progress: Progressing toward goal Long Term Goal 2: Patient will increase AROM WNL to increase ability to complete daily activities.  Long Term Goal 2 Progress: Progressing toward goal Long Term Goal 3: Patient will increase shoulder strength to 4/5 to be able to lift heavy items at work overhead. Long Term Goal 3 Progress: Progressing toward goal Long Term Goal 4: Patient will decrease fascial restrictions in left arm to zero  to increase ability to reach up overhead. Long Term Goal 4 Progress: Progressing toward goal  Problem List Patient Active Problem List   Diagnosis Date Noted  . Pain in joint, shoulder region 01/21/2013  . Proximal humerus fracture 11/20/2012  . Muscle weakness (generalized) 01/12/2011  . Shoulder pain 01/04/2011  . Neck pain 01/04/2011  . Left tennis elbow 01/04/2011  . CAD (coronary artery disease) 12/10/2010  . Pulmonary embolus 11/26/2010    Class: History of  . BREAST CANCER 12/18/2009  . HYPERLIPIDEMIA 12/18/2009  .  CAD 12/18/2009  . GERD 12/18/2009  . EDEMA 12/18/2009  . CHEST PAIN 12/18/2009  . HYPERTENSION, HX OF 12/18/2009  . KNEE PAIN 02/11/2008  . ANSERINE  BURSITIS 02/11/2008    End of Session Activity Tolerance: Patient tolerated treatment well General Behavior During Therapy: [facility] for tasks assessed/performed   Limmie Patricia, OTR/L,CBIS   02/04/2013, 3:32 PM

## 2013-02-07 ENCOUNTER — Ambulatory Visit (HOSPITAL_COMMUNITY)
Admission: RE | Admit: 2013-02-07 | Discharge: 2013-02-07 | Disposition: A | Discharge status: Home / Self Care | Payer: Managed Care, Other (non HMO) | Source: Ambulatory Visit | Attending: Orthopedic Surgery | Admitting: Orthopedic Surgery

## 2013-02-07 DIAGNOSIS — IMO0001 Reserved for inherently not codable concepts without codable children: Secondary | ICD-10-CM | POA: Insufficient documentation

## 2013-02-07 DIAGNOSIS — M6281 Muscle weakness (generalized): Secondary | ICD-10-CM | POA: Insufficient documentation

## 2013-02-07 DIAGNOSIS — M25519 Pain in unspecified shoulder: Secondary | ICD-10-CM | POA: Insufficient documentation

## 2013-02-07 DIAGNOSIS — I10 Essential (primary) hypertension: Secondary | ICD-10-CM | POA: Insufficient documentation

## 2013-02-07 NOTE — Progress Notes (Signed)
Occupational Therapy Treatment Patient Details  Name: [name] [name] MRN: [id number] Date of Birth: 1955/07/26  Today's Date: 02/07/2013 Time: 4098-1191 OT Time Calculation (min): 41 min MFR 1350-1406 16' Therex 4782-9562 25'  Visit#: 5 of 12  Re-eval: 02/18/13    Authorization:    Authorization Time Period:    Authorization Visit#:   of    Subjective Symptoms/Limitations Symptoms: S: I reached out to the side at work to put something in a box while I was sitting and it really stretched. Afterwards I knew I shouldn't have done that. Pain Assessment Currently in Pain?: Yes Pain Score: 1  Pain Location: Shoulder Pain Orientation: Left Pain Type: Acute pain  Precautions/Restrictions   None  Exercise/Treatments Supine Protraction: PROM;AROM;10 reps Horizontal ABduction: PROM;AROM;10 reps External Rotation: PROM;AROM;10 reps Internal Rotation: PROM;AROM;10 reps Flexion: PROM;AROM;10 reps ABduction: PROM;AROM;10 reps Seated Protraction: AROM;10 reps Horizontal ABduction: AAROM;10 reps External Rotation: AAROM;10 reps Internal Rotation: AAROM;10 reps Flexion: AAROM;10 reps Abduction: AAROM;10 reps Pulleys Flexion: 1 minute ABduction: 1 minute ROM / Strengthening / Isometric Strengthening Proximal Shoulder Strengthening, Supine: 10 times  Proximal Shoulder Strengthening, Seated: 10X with rest breaks. Rhythmic Stabilization, Supine: 15" X 2          Manual Therapy Manual Therapy: Myofascial release Myofascial Release: MFR to left upper arm, trapezious, and scapularis region to decrease fascial restrictions and increase joint mobility in a pain free zone.  Occupational Therapy Assessment and Plan OT Assessment and Plan Clinical Impression Statement: A: Added AAROM seated, pulleys, and proximal shoulder strengthening seated. Patient tolerated well.  OT Plan: P: Work on performing proximal shoulder strengthening without rest breaks.    Goals Short Term Goals Time  to Complete Short Term Goals: 3 weeks Short Term Goal 1: Patient will be educated on HEP.  Short Term Goal 2: Patient will increase PROM to [facility] to decrease difficulty with donning and doffing shirts.  Short Term Goal 3: Patient will increase shoulder strength to 3+/5 to increase ability to pull pants over hips.  Short Term Goal 4: Patient will have 0/10 pain when completing daily activities.  Short Term Goal 5: Patient will decrease fascial restrictions in left arm to trace to increase ability to reach up overhead. Long Term Goals Time to Complete Long Term Goals: 6 weeks Long Term Goal 1: Patient will return to highest level of independence with all daily, leisure, and work activities.  Long Term Goal 2: Patient will increase AROM WNL to increase ability to complete daily activities.  Long Term Goal 3: Patient will increase shoulder strength to 4/5 to be able to lift heavy items at work overhead. Long Term Goal 4: Patient will decrease fascial restrictions in left arm to zero  to increase ability to reach up overhead.  Problem List Patient Active Problem List   Diagnosis Date Noted  . Pain in joint, shoulder region 01/21/2013  . Proximal humerus fracture 11/20/2012  . Muscle weakness (generalized) 01/12/2011  . Shoulder pain 01/04/2011  . Neck pain 01/04/2011  . Left tennis elbow 01/04/2011  . CAD (coronary artery disease) 12/10/2010  . Pulmonary embolus 11/26/2010    Class: History of  . BREAST CANCER 12/18/2009  . HYPERLIPIDEMIA 12/18/2009  . CAD 12/18/2009  . GERD 12/18/2009  . EDEMA 12/18/2009  . CHEST PAIN 12/18/2009  . HYPERTENSION, HX OF 12/18/2009  . KNEE PAIN 02/11/2008  . ANSERINE BURSITIS 02/11/2008    End of Session Activity Tolerance: Patient tolerated treatment well General Behavior During Therapy: [facility] for tasks assessed/performed  Limmie Patricia, OTR/L,CBIS   02/07/2013, 3:08 PM

## 2013-02-11 ENCOUNTER — Ambulatory Visit (HOSPITAL_COMMUNITY)
Admission: RE | Admit: 2013-02-11 | Discharge: 2013-02-11 | Disposition: A | Discharge status: Home / Self Care | Payer: Managed Care, Other (non HMO) | Source: Ambulatory Visit | Attending: Pulmonary Disease | Admitting: Pulmonary Disease

## 2013-02-11 NOTE — Progress Notes (Signed)
Occupational Therapy Treatment Patient Details  Name: [name] [name] MRN: [id number] Date of Birth: 10/29/1955  Today's Date: 02/11/2013 Time: 5009-3818 OT Time Calculation (min): 55 min Manual Therapy 1350-1410 20' Therapeutic exercises 1410-1445 35' Visit#: 6 of 12  Re-eval: 02/18/13     Subjective  S:  Ive been back to work for 5 hours this week and last.  I still cant lift my arm over my head.  Pain Assessment Currently in Pain?: No/denies Pain Score: 0-No pain  Precautions/Restrictions   progress as tolerated  Exercise/Treatments Supine Protraction: PROM;10 reps;AROM;12 reps Horizontal ABduction: PROM;10 reps;AROM;12 reps External Rotation: PROM;10 reps;AROM;12 reps Internal Rotation: PROM;10 reps;AROM;12 reps Flexion: PROM;10 reps;AROM;12 reps ABduction: PROM;10 reps;AROM;12 reps Seated Elevation: AROM;15 reps Extension: AROM;15 reps Retraction: AROM;15 reps Row: 15 reps Protraction: AROM;10 reps (hand over hand facilitation from OT to depress shoulder blad) Horizontal ABduction: AROM;10 reps (min facilitation to depress sholder blade) External Rotation: AROM;10 reps (min facilitation to depress shoulder blade) Internal Rotation: AROM;10 reps (min facilitation to depress shoulder blade ) Flexion: AROM;10 reps (min facilitation to depress shoulder blade ) Abduction: AROM;10 reps (min facilitation to depress shoulder blade) Pulleys Flexion: 2 minutes ABduction: 2 minutes Therapy Ball Flexion: 20 reps ABduction: 20 reps Right/Left: 5 reps ROM / Strengthening / Isometric Strengthening Wall Wash: 2' Thumb Tacks: 1' Proximal Shoulder Strengthening, Supine: 10 times  Proximal Shoulder Strengthening, Seated: resume next visit Prot/Ret//Elev/Dep: 1' Rhythmic Stabilization, Supine: resume next visit       Manual Therapy Manual Therapy: Myofascial release Myofascial Release: MFR to left upper arm, trapezious, and scapularis region to decrease fascial restrictions  and increase joint mobility in a pain free zone.  Occupational Therapy Assessment and Plan OT Assessment and Plan Clinical Impression Statement: A:  Required vg and tactile cues with each seated exercise to depress shoulder blade. OT Plan: P:  Complete AROM in seated and functional reaching with greater independence maintaining depressed shoulder blade.    Goals Short Term Goals Time to Complete Short Term Goals: 3 weeks Short Term Goal 1: Patient will be educated on HEP.  Short Term Goal 1 Progress: Progressing toward goal Short Term Goal 2: Patient will increase PROM to [facility] to decrease difficulty with donning and doffing shirts.  Short Term Goal 2 Progress: Progressing toward goal Short Term Goal 3: Patient will increase shoulder strength to 3+/5 to increase ability to pull pants over hips.  Short Term Goal 3 Progress: Progressing toward goal Short Term Goal 4: Patient will have 0/10 pain when completing daily activities.  Short Term Goal 4 Progress: Progressing toward goal Short Term Goal 5: Patient will decrease fascial restrictions in left arm to trace to increase ability to reach up overhead. Short Term Goal 5 Progress: Progressing toward goal Long Term Goals Time to Complete Long Term Goals: 6 weeks Long Term Goal 1: Patient will return to highest level of independence with all daily, leisure, and work activities.  Long Term Goal 1 Progress: Progressing toward goal Long Term Goal 2: Patient will increase AROM WNL to increase ability to complete daily activities.  Long Term Goal 2 Progress: Progressing toward goal Long Term Goal 3: Patient will increase shoulder strength to 4/5 to be able to lift heavy items at work overhead. Long Term Goal 3 Progress: Progressing toward goal Long Term Goal 4: Patient will decrease fascial restrictions in left arm to zero  to increase ability to reach up overhead. Long Term Goal 4 Progress: Progressing toward goal  Problem List Patient Active  Problem List   Diagnosis Date Noted  . Pain in joint, shoulder region 01/21/2013  . Proximal humerus fracture 11/20/2012  . Muscle weakness (generalized) 01/12/2011  . Shoulder pain 01/04/2011  . Neck pain 01/04/2011  . Left tennis elbow 01/04/2011  . CAD (coronary artery disease) 12/10/2010  . Pulmonary embolus 11/26/2010    Class: History of  . BREAST CANCER 12/18/2009  . HYPERLIPIDEMIA 12/18/2009  . CAD 12/18/2009  . GERD 12/18/2009  . EDEMA 12/18/2009  . CHEST PAIN 12/18/2009  . HYPERTENSION, HX OF 12/18/2009  . KNEE PAIN 02/11/2008  . ANSERINE BURSITIS 02/11/2008    End of Session Activity Tolerance: Patient tolerated treatment well General Behavior During Therapy: [facility] for tasks assessed/performed  GO    Shirlean Mylar, OTR/L  02/11/2013, 4:43 PM

## 2013-02-14 ENCOUNTER — Ambulatory Visit (HOSPITAL_COMMUNITY)
Admission: RE | Admit: 2013-02-14 | Discharge: 2013-02-14 | Disposition: A | Discharge status: Home / Self Care | Payer: Managed Care, Other (non HMO) | Source: Ambulatory Visit | Attending: Pulmonary Disease | Admitting: Pulmonary Disease

## 2013-02-14 NOTE — Progress Notes (Signed)
Occupational Therapy Treatment Patient Details  Name: [name] [name] MRN: [id number] Date of Birth: [birth year]  Today's Date: 02/14/2013 Time: 4098-1191 OT Time Calculation (min): 38 min Manual therapy 4782-9562 14' Therapeutic exercises 1325-1349 24' Visit#: 7 of 12  Re-eval: 02/18/13    Subjective S:  I go back to work full time next week, Im not sure about that.  Pain Assessment Currently in Pain?: Yes Pain Score: 5  Pain Location: Shoulder Pain Orientation: Left Pain Type: Acute pain  Precautions/Restrictions   progress as tolerated  Exercise/Treatments Supine Protraction: PROM;10 reps;AROM;15 reps Horizontal ABduction: PROM;10 reps;AROM;15 reps External Rotation: PROM;10 reps;AROM;15 reps Internal Rotation: PROM;10 reps;AROM;15 reps Flexion: PROM;10 reps;AROM;15 reps ABduction: PROM;10 reps;AROM;15 reps Seated Protraction: AROM;10 reps Horizontal ABduction: AROM;10 reps External Rotation: AROM;10 reps Internal Rotation: AROM;10 reps Flexion: AROM;10 reps Abduction: AROM;10 reps Therapy Ball Right/Left: 5 reps ROM / Strengthening / Isometric Strengthening UBE (Upper Arm Bike): 2' and 2' 1.0 Wall Wash: 2'      Manual Therapy Manual Therapy: Myofascial release Myofascial Release: MFR to left upper arm, trapezious, and scapularis region to decrease fascial restrictions and increase joint mobility in a pain free zone.  Occupational Therapy Assessment and Plan OT Assessment and Plan Clinical Impression Statement: A:  Able to complete AROM in seated without therapist facilitation.  Tender point in posterior proximal shoulder this date.  OT Plan: P:  Increase time with wall wash and UBE.  Reassess.   Goals Short Term Goals Time to Complete Short Term Goals: 3 weeks Short Term Goal 1: Patient will be educated on HEP.  Short Term Goal 2: Patient will increase PROM to [facility] to decrease difficulty with donning and doffing shirts.  Short Term Goal 3: Patient will  increase shoulder strength to 3+/5 to increase ability to pull pants over hips.  Short Term Goal 4: Patient will have 0/10 pain when completing daily activities.  Short Term Goal 5: Patient will decrease fascial restrictions in left arm to trace to increase ability to reach up overhead. Long Term Goals Time to Complete Long Term Goals: 6 weeks Long Term Goal 1: Patient will return to highest level of independence with all daily, leisure, and work activities.  Long Term Goal 2: Patient will increase AROM WNL to increase ability to complete daily activities.  Long Term Goal 3: Patient will increase shoulder strength to 4/5 to be able to lift heavy items at work overhead. Long Term Goal 4: Patient will decrease fascial restrictions in left arm to zero  to increase ability to reach up overhead.  Problem List Patient Active Problem List   Diagnosis Date Noted  . Pain in joint, shoulder region 01/21/2013  . Proximal humerus fracture 11/20/2012  . Muscle weakness (generalized) 01/12/2011  . Shoulder pain 01/04/2011  . Neck pain 01/04/2011  . Left tennis elbow 01/04/2011  . CAD (coronary artery disease) 12/10/2010  . Pulmonary embolus 11/26/2010    Class: History of  . BREAST CANCER 12/18/2009  . HYPERLIPIDEMIA 12/18/2009  . CAD 12/18/2009  . GERD 12/18/2009  . EDEMA 12/18/2009  . CHEST PAIN 12/18/2009  . HYPERTENSION, HX OF 12/18/2009  . KNEE PAIN 02/11/2008  . ANSERINE BURSITIS 02/11/2008    End of Session Activity Tolerance: Patient tolerated treatment well General Behavior During Therapy: [facility] for tasks assessed/performed  GO    Shirlean Mylar, OTR/L  02/14/2013, 1:49 PM

## 2013-02-20 ENCOUNTER — Ambulatory Visit (HOSPITAL_COMMUNITY)
Admission: RE | Admit: 2013-02-20 | Discharge: 2013-02-20 | Disposition: A | Discharge status: Home / Self Care | Payer: Managed Care, Other (non HMO) | Source: Ambulatory Visit | Attending: Pulmonary Disease | Admitting: Pulmonary Disease

## 2013-02-22 ENCOUNTER — Ambulatory Visit (HOSPITAL_COMMUNITY)
Admission: RE | Admit: 2013-02-22 | Discharge: 2013-02-22 | Disposition: A | Discharge status: Home / Self Care | Payer: Managed Care, Other (non HMO) | Source: Ambulatory Visit | Attending: Pulmonary Disease | Admitting: Pulmonary Disease

## 2013-02-22 NOTE — Evaluation (Signed)
Occupational Therapy Re-Evaluation  Patient Details  Name: [name] [name] MRN: [id number] Date of Birth: 1956-03-10  Today's Date: 02/22/2013 Time: 1191-4782 OT Time Calculation (min): 49 min Manual 1345-1400 (15') Reassessment  1400-1413 (13') TherExercises 1413-1434 (21')  Visit#: 9 of 12  Re-eval: 03/19/13  Assessment Diagnosis: left proximal humerus fx   Past Medical History:  Past Medical History  Diagnosis Date  . CAD (coronary artery disease) 2006    DES to LAD  . Chest pain   . HTN (hypertension)   . Hyperlipidemia   . GERD (gastroesophageal reflux disease)   . Edema     legs  . Anserine bursitis   . Knee pain   . Cardiomegaly   . Pulmonary embolism   . Kidney stones   . Complication of anesthesia   . PONV (postoperative nausea and vomiting)   . MI (myocardial infarction) 2005  . Breast cancer     left lumpectomy   Past Surgical History:  Past Surgical History  Procedure Laterality Date  . Colonoscopy with cold snare and snare cautery polypectomy    . Cardiac catheterization      DES to LAD  . Cholecystectomy    . Lymphadenectomy    . Appendectomy    . Tonsilectomy, adenoidectomy, bilateral myringotomy and tubes    . Cystectomy      rt. palm  . C/s tubal ligation    . Stent implant    . Tonsillectomy    . Coronary angioplasty  2005    1 stent  . Breast lumpectomy      left  . Tubal ligation      with c-section  . Hysteroscopy w/d&c  03/28/2012    Procedure: DILATATION AND CURETTAGE /HYSTEROSCOPY;  Surgeon: Lazaro Arms, MD;  Location: [facility];  Service: Gynecology;  Laterality: N/A;    Subjective Symptoms/Limitations Symptoms: S:  I am tender where that knot was the other day....  Pain Assessment Currently in Pain?: Yes Pain Score: 1  Pain Location: Shoulder Pain Orientation: Left Pain Type: Acute pain Multiple Pain Sites: No  Precautions/Restrictions  Precautions Precautions: None     Assessment  (previous values) Additional  Assessments LUE Assessment LUE Assessment:  (assessed in sitting ) LUE AROM (degrees) LUE Overall AROM Comments: Cues for depressing shoulder as compensatory tech for Range of motion  Left Shoulder Flexion: 137 Degrees ((130)) Left Shoulder ABduction: 128 Degrees ((115)) Left Shoulder Internal Rotation: 90 Degrees ((90)) Left Shoulder External Rotation: 33 Degrees ((15)) LUE Strength Left Shoulder Flexion: 3+/5 Left Shoulder ABduction: 3+/5 Left Shoulder Internal Rotation: 3+/5 Left Shoulder External Rotation: 3/5 Palpation Palpation: Min fascial restrictions in left upper arm, trapezious and scapularis region.      Exercise/Treatments Supine Protraction: PROM;10 reps;AROM;15 reps Horizontal ABduction: PROM;10 reps;AROM;15 reps External Rotation: PROM;10 reps;AROM;15 reps Internal Rotation: PROM;10 reps;AROM;15 reps Flexion: PROM;10 reps;AROM;15 reps ABduction: PROM;10 reps;AROM;15 reps Seated Protraction: AROM;10 reps Horizontal ABduction: AROM;10 reps External Rotation: AROM;10 reps Internal Rotation: AROM;10 reps Flexion: AROM;10 reps Abduction: AROM;10 reps Therapy Ball Right/Left: 5 reps ROM / Strengthening / Isometric Strengthening UBE (Upper Arm Bike): 2' and 2' 2.0 Wall Wash: 3' X to V Arms: supine X 12reps  Proximal Shoulder Strengthening, Supine: 10 times          Manual Therapy Manual Therapy: Myofascial release Myofascial Release: MFR to left upper arm, trapezious, and scapularis region to decrease fascial restrictions and increase joint mobility in a pain free zone.    Occupational Therapy Assessment and Plan  OT Assessment and Plan Clinical Impression Statement: A:  Reassessment completed this date.   Patient with increased over range of motion, increased strength and increasing functional use however not 100%.  Patient continue with tenderness on Left anterior aspect of arm.  Tolerated increased time on wall wash and increaased UBE resistance with  minimal increased complaint of pain to 3/10 and increased complaint of fatigue requiring one break with wall wash.   OT Plan: P: increase AROM exercises and begin weighted exercises.     Goals Short Term Goals Short Term Goal 1: Patient will be educated on HEP.  Short Term Goal 1 Progress: Met Short Term Goal 2: Patient will increase PROM to [facility] to decrease difficulty with donning and doffing shirts.  Short Term Goal 2 Progress: Met Short Term Goal 3: Patient will increase shoulder strength to 3+/5 to increase ability to pull pants over hips.  Short Term Goal 3 Progress: Met Short Term Goal 4: Patient will have 0/10 pain when completing daily activities.  Short Term Goal 4 Progress: Progressing toward goal Short Term Goal 5: Patient will decrease fascial restrictions in left arm to trace to increase ability to reach up overhead. Short Term Goal 5 Progress: Progressing toward goal Long Term Goals Long Term Goal 1: Patient will return to highest level of independence with all daily, leisure, and work activities.  Long Term Goal 1 Progress: Progressing toward goal Long Term Goal 2: Patient will increase AROM WNL to increase ability to complete daily activities.  Long Term Goal 2 Progress: Progressing toward goal Long Term Goal 3: Patient will increase shoulder strength to 4/5 to be able to lift heavy items at work overhead. Long Term Goal 3 Progress: Progressing toward goal Long Term Goal 4: Patient will decrease fascial restrictions in left arm to zero  to increase ability to reach up overhead. Long Term Goal 4 Progress: Progressing toward goal  Problem List Patient Active Problem List   Diagnosis Date Noted  . Pain in joint, shoulder region 01/21/2013  . Proximal humerus fracture 11/20/2012  . Muscle weakness (generalized) 01/12/2011  . Shoulder pain 01/04/2011  . Neck pain 01/04/2011  . Left tennis elbow 01/04/2011  . CAD (coronary artery disease) 12/10/2010  . Pulmonary embolus  11/26/2010    Class: History of  . BREAST CANCER 12/18/2009  . HYPERLIPIDEMIA 12/18/2009  . CAD 12/18/2009  . GERD 12/18/2009  . EDEMA 12/18/2009  . CHEST PAIN 12/18/2009  . HYPERTENSION, HX OF 12/18/2009  . KNEE PAIN 02/11/2008  . ANSERINE BURSITIS 02/11/2008    End of Session Activity Tolerance: Patient tolerated treatment well General Behavior During Therapy: [facility] for tasks assessed/performed  GO    Velora Mediate, OTR/L  02/22/2013, 2:39 PM  Physician Documentation Your signature is required to indicate approval of the treatment plan as stated above.  Please sign and either send electronically or make a copy of this report for your files and return this physician signed original.  Please mark one 1.__approve of plan  2. ___approve of plan with the following conditions.   ______________________________                                                          _____________________ Physician Signature  Date  

## 2013-02-22 NOTE — Progress Notes (Signed)
Occupational Therapy Treatment Patient Details  Name: [name] [name] MRN: [id number] Date of Birth: 1956-04-21  Today's Date: 02/22/2013 Time: 1435-1520 OT Time Calculation (min): 45 min  Manual 1435-1450 (15') TherExercise 1450-1520 (30')   Visit#: 8 of 12  Re-eval: 02/18/13    Subjective Symptoms/Limitations Symptoms: S: This is my third full day back... I am just so tired by the time I am done.  Pain Assessment Currently in Pain?: Yes Pain Score: 2  Pain Location: Shoulder Pain Orientation: Left Pain Type: Acute pain Multiple Pain Sites: No      Exercise/Treatments Supine Protraction: PROM;10 reps;AROM;15 reps Horizontal ABduction: PROM;10 reps;AROM;15 reps External Rotation: PROM;10 reps;AROM;15 reps Internal Rotation: PROM;10 reps;AROM;15 reps Flexion: PROM;10 reps;AROM;15 reps ABduction: PROM;10 reps;AROM;15 reps Seated Protraction: AROM;10 reps Horizontal ABduction: AROM;10 reps External Rotation: AROM;10 reps Internal Rotation: AROM;10 reps Flexion: AROM;10 reps Abduction: AROM;10 reps Therapy Ball Right/Left: 5 reps ROM / Strengthening / Isometric Strengthening UBE (Upper Arm Bike): 2' and 2' 1.5 Wall Wash: 2.5' X to V Arms: supine X 12reps  Proximal Shoulder Strengthening, Supine: 10 times                 Occupational Therapy Assessment and Plan OT Assessment and Plan Clinical Impression Statement: A: Increased complaint of fatigue in UE/shoulder this date following going back to work.  Continue with Tender point posterior shoulder this date.  OT Plan: P: Reassess, increase wall wash time and UBE resistance    Goals Short Term Goals Short Term Goal 1: Patient will be educated on HEP.  Short Term Goal 1 Progress: Progressing toward goal Short Term Goal 2: Patient will increase PROM to [facility] to decrease difficulty with donning and doffing shirts.  Short Term Goal 2 Progress: Progressing toward goal Short Term Goal 3: Patient will increase  shoulder strength to 3+/5 to increase ability to pull pants over hips.  Short Term Goal 3 Progress: Progressing toward goal Short Term Goal 4: Patient will have 0/10 pain when completing daily activities.  Short Term Goal 4 Progress: Progressing toward goal Short Term Goal 5: Patient will decrease fascial restrictions in left arm to trace to increase ability to reach up overhead. Short Term Goal 5 Progress: Progressing toward goal Long Term Goals Long Term Goal 1: Patient will return to highest level of independence with all daily, leisure, and work activities.  Long Term Goal 1 Progress: Progressing toward goal Long Term Goal 2: Patient will increase AROM WNL to increase ability to complete daily activities.  Long Term Goal 2 Progress: Progressing toward goal Long Term Goal 3: Patient will increase shoulder strength to 4/5 to be able to lift heavy items at work overhead. Long Term Goal 3 Progress: Progressing toward goal Long Term Goal 4: Patient will decrease fascial restrictions in left arm to zero  to increase ability to reach up overhead. Long Term Goal 4 Progress: Progressing toward goal  Problem List Patient Active Problem List   Diagnosis Date Noted  . Pain in joint, shoulder region 01/21/2013  . Proximal humerus fracture 11/20/2012  . Muscle weakness (generalized) 01/12/2011  . Shoulder pain 01/04/2011  . Neck pain 01/04/2011  . Left tennis elbow 01/04/2011  . CAD (coronary artery disease) 12/10/2010  . Pulmonary embolus 11/26/2010    Class: History of  . BREAST CANCER 12/18/2009  . HYPERLIPIDEMIA 12/18/2009  . CAD 12/18/2009  . GERD 12/18/2009  . EDEMA 12/18/2009  . CHEST PAIN 12/18/2009  . HYPERTENSION, HX OF 12/18/2009  . KNEE PAIN 02/11/2008  .  ANSERINE BURSITIS 02/11/2008    End of Session Activity Tolerance: Patient tolerated treatment well General Behavior During Therapy: [facility] for tasks assessed/performed  GO    Velora Mediate, OTR/L  02/21/2013, 1:30  PM

## 2013-02-25 ENCOUNTER — Ambulatory Visit (HOSPITAL_COMMUNITY)
Admission: RE | Admit: 2013-02-25 | Discharge: 2013-02-25 | Disposition: A | Discharge status: Home / Self Care | Payer: Managed Care, Other (non HMO) | Source: Ambulatory Visit | Attending: Pulmonary Disease | Admitting: Pulmonary Disease

## 2013-02-27 ENCOUNTER — Ambulatory Visit (HOSPITAL_COMMUNITY): Payer: Managed Care, Other (non HMO) | Admitting: Occupational Therapy

## 2013-02-27 NOTE — Progress Notes (Signed)
Occupational Therapy Treatment Patient Details  Name: [name] [name] MRN: [id number] Date of Birth: [birth year]  Today's Date: 02/25/2013 Time: 1520-1605 OT Time Calculation (min): 45 min Manual 1520-1535 (15') TherExercise 1535-1605 (30')  Visit#: 10 of    Re-eval:      Subjective Symptoms/Limitations Symptoms: S: I feel like I am moving it better just not where I want it to be Pain Assessment Currently in Pain?: Yes Pain Score: 1  Pain Location: Shoulder Pain Orientation: Left Pain Type: Acute pain Multiple Pain Sites: No  Precautions/Restrictions     Exercise/Treatments Supine Protraction: PROM;10 reps;AROM;15 reps;Strengthening (1#) Horizontal ABduction: PROM;10 reps;AROM;15 reps External Rotation: PROM;10 reps;AROM;15 reps;Strengthening (1#) Internal Rotation: PROM;10 reps;AROM;15 reps;Strengthening (1#) Flexion: PROM;10 reps;AROM;15 reps;Strengthening (1#) ABduction: PROM;10 reps;AROM;15 reps;Strengthening (1#) Seated Horizontal ABduction: AROM;10 reps External Rotation: AROM;10 reps Flexion: AROM;10 reps Abduction: AROM;10 reps Therapy Ball Right/Left: 5 reps ROM / Strengthening / Isometric Strengthening UBE (Upper Arm Bike): 2.5' and 2.5' 2.0 Wall Wash: 3'        Manual Therapy Manual Therapy: Myofascial release Myofascial Release: MFR to left upper arm, trapezious, and scapularis region to decrease fascial restrictions and increase joint mobility in a pain free zone.   Occupational Therapy Assessment and Plan OT Assessment and Plan Clinical Impression Statement: A:  Increased time of wall wash and UBE this date with good tolerance..  Also added 1# weight to supine exercises with no increased complaints.  OT Plan: P: progress weghted exercises in sitting   Goals Short Term Goals Short Term Goal 1: Patient will be educated on HEP.  Short Term Goal 1 Progress: Met Short Term Goal 2: Patient will increase PROM to [facility] to decrease difficulty with donning  and doffing shirts.  Short Term Goal 2 Progress: Met Short Term Goal 3: Patient will increase shoulder strength to 3+/5 to increase ability to pull pants over hips.  Short Term Goal 3 Progress: Met Short Term Goal 4: Patient will have 0/10 pain when completing daily activities.  Short Term Goal 4 Progress: Progressing toward goal Short Term Goal 5: Patient will decrease fascial restrictions in left arm to trace to increase ability to reach up overhead. Short Term Goal 5 Progress: Progressing toward goal Long Term Goals Long Term Goal 1: Patient will return to highest level of independence with all daily, leisure, and work activities.  Long Term Goal 1 Progress: Progressing toward goal Long Term Goal 2: Patient will increase AROM WNL to increase ability to complete daily activities.  Long Term Goal 2 Progress: Progressing toward goal Long Term Goal 3: Patient will increase shoulder strength to 4/5 to be able to lift heavy items at work overhead. Long Term Goal 3 Progress: Progressing toward goal Long Term Goal 4: Patient will decrease fascial restrictions in left arm to zero  to increase ability to reach up overhead. Long Term Goal 4 Progress: Progressing toward goal  Problem List Patient Active Problem List   Diagnosis Date Noted  . Pain in joint, shoulder region 01/21/2013  . Proximal humerus fracture 11/20/2012  . Muscle weakness (generalized) 01/12/2011  . Shoulder pain 01/04/2011  . Neck pain 01/04/2011  . Left tennis elbow 01/04/2011  . CAD (coronary artery disease) 12/10/2010  . Pulmonary embolus 11/26/2010    Class: History of  . BREAST CANCER 12/18/2009  . HYPERLIPIDEMIA 12/18/2009  . CAD 12/18/2009  . GERD 12/18/2009  . EDEMA 12/18/2009  . CHEST PAIN 12/18/2009  . HYPERTENSION, HX OF 12/18/2009  . KNEE PAIN 02/11/2008  .  ANSERINE BURSITIS 02/11/2008       GO    Velora Mediate, OTR/L  02/25/2013, 5:50 PM

## 2013-03-04 ENCOUNTER — Ambulatory Visit (HOSPITAL_COMMUNITY)
Admission: RE | Admit: 2013-03-04 | Discharge: 2013-03-04 | Disposition: A | Discharge status: Home / Self Care | Payer: Managed Care, Other (non HMO) | Source: Ambulatory Visit | Attending: Pulmonary Disease | Admitting: Pulmonary Disease

## 2013-03-04 NOTE — Progress Notes (Signed)
Occupational Therapy Treatment Patient Details  Name: [name] [name] MRN: [id number] Date of Birth: 05/23/1955  Today's Date: 03/04/2013 Time: 4098-1191 OT Time Calculation (min): 48 min Manual therapy 4782-9562 23' Therapeutic exercises 1308-6578 25' Visit#: 11 of 12  Re-eval: 03/19/13    Subjective  S:  I am hoping this will be my last week.    Precautions/Restrictions   progress as tolerated  Exercise/Treatments Supine Protraction: PROM;Strengthening;10 reps Protraction Weight (lbs): 2 Horizontal ABduction: PROM;Strengthening;10 reps Horizontal ABduction Weight (lbs): 2 External Rotation: PROM;Strengthening;10 reps External Rotation Weight (lbs): 2 Internal Rotation: PROM;Strengthening;10 reps Internal Rotation Weight (lbs): 2 Flexion: PROM;Strengthening;10 reps Shoulder Flexion Weight (lbs): 2 ABduction: PROM;Strengthening;10 reps Shoulder ABduction Weight (lbs): 2 Seated Protraction: Strengthening;10 reps Protraction Weight (lbs): 1 Horizontal ABduction: Strengthening;10 reps Horizontal ABduction Weight (lbs): 1 External Rotation: Strengthening;10 reps External Rotation Weight (lbs): 1 Internal Rotation: Strengthening;10 reps Internal Rotation Weight (lbs): 1 Flexion: Strengthening;10 reps Flexion Weight (lbs): 1 Abduction: Strengthening;10 reps ABduction Weight (lbs): 1 ROM / Strengthening / Isometric Strengthening UBE (Upper Arm Bike): 3' and 3' at 2.5 Wall Wash: 3'  "W" Arms: seated 10 times  X to V Arms: seated x to v 10 times Proximal Shoulder Strengthening, Supine: 10 times with 2# Proximal Shoulder Strengthening, Seated: 10 times with 1#      Manual Therapy Manual Therapy: Myofascial release Myofascial Release: MFR to left upper arm, trapezious, and scapularis region to decrease fascial restrictions and increase joint mobility in a pain free zone.   Occupational Therapy Assessment and Plan OT Assessment and Plan Clinical Impression Statement: A:   Increased to 2# in supine and added 1# to seated strengthening exercises.  Min vg to depress scapula when completing seated exercises.  OT Plan: P:  Add cybex press and row, add weight to wall wash, reassess for MD progress note.    Goals Short Term Goals Short Term Goal 1: Patient will be educated on HEP.  Short Term Goal 2: Patient will increase PROM to [facility] to decrease difficulty with donning and doffing shirts.  Short Term Goal 3: Patient will increase shoulder strength to 3+/5 to increase ability to pull pants over hips.  Short Term Goal 4: Patient will have 0/10 pain when completing daily activities.  Short Term Goal 5: Patient will decrease fascial restrictions in left arm to trace to increase ability to reach up overhead. Long Term Goals Long Term Goal 1: Patient will return to highest level of independence with all daily, leisure, and work activities.  Long Term Goal 2: Patient will increase AROM WNL to increase ability to complete daily activities.  Long Term Goal 3: Patient will increase shoulder strength to 4/5 to be able to lift heavy items at work overhead. Long Term Goal 4: Patient will decrease fascial restrictions in left arm to zero  to increase ability to reach up overhead.  Problem List Patient Active Problem List   Diagnosis Date Noted  . Pain in joint, shoulder region 01/21/2013  . Proximal humerus fracture 11/20/2012  . Muscle weakness (generalized) 01/12/2011  . Shoulder pain 01/04/2011  . Neck pain 01/04/2011  . Left tennis elbow 01/04/2011  . CAD (coronary artery disease) 12/10/2010  . Pulmonary embolus 11/26/2010    Class: History of  . BREAST CANCER 12/18/2009  . HYPERLIPIDEMIA 12/18/2009  . CAD 12/18/2009  . GERD 12/18/2009  . EDEMA 12/18/2009  . CHEST PAIN 12/18/2009  . HYPERTENSION, HX OF 12/18/2009  . KNEE PAIN 02/11/2008  . ANSERINE BURSITIS 02/11/2008  End of Session Activity Tolerance: Patient tolerated treatment well General Behavior  During Therapy: [facility] for tasks assessed/performed  GO    Shirlean Mylar, OTR/L  03/04/2013, 4:24 PM

## 2013-03-05 ENCOUNTER — Ambulatory Visit: Payer: Managed Care, Other (non HMO) | Admitting: Orthopedic Surgery

## 2013-03-06 ENCOUNTER — Ambulatory Visit (HOSPITAL_COMMUNITY)
Admission: RE | Admit: 2013-03-06 | Discharge: 2013-03-06 | Disposition: A | Discharge status: Home / Self Care | Payer: Managed Care, Other (non HMO) | Source: Ambulatory Visit | Attending: Pulmonary Disease | Admitting: Pulmonary Disease

## 2013-03-07 ENCOUNTER — Encounter: Payer: Self-pay | Admitting: Orthopedic Surgery

## 2013-03-07 ENCOUNTER — Ambulatory Visit (INDEPENDENT_AMBULATORY_CARE_PROVIDER_SITE_OTHER): Payer: Managed Care, Other (non HMO) | Admitting: Orthopedic Surgery

## 2013-03-07 VITALS — BP 123/78 | Ht 64.0 in | Wt 271.0 lb

## 2013-03-07 DIAGNOSIS — S42202D Unspecified fracture of upper end of left humerus, subsequent encounter for fracture with routine healing: Secondary | ICD-10-CM

## 2013-03-07 DIAGNOSIS — S42309D Unspecified fracture of shaft of humerus, unspecified arm, subsequent encounter for fracture with routine healing: Secondary | ICD-10-CM

## 2013-03-07 NOTE — Patient Instructions (Signed)
Home exercises as tolerated

## 2013-03-07 NOTE — Progress Notes (Signed)
Patient ID: [name] [name], female   DOB: 1955/06/25, 57 y.o.   MRN: [id number] Chief Complaint  Patient presents with  . Follow-up    1 month recheck on left shoulder fracture. DOI 11-16-12.    BP 123/78  Ht 5\' 4"  (1.626 m)  Wt 271 lb (122.925 kg)  BMI 46.49 kg/m2  The patient fractured her left proximal humerus in July she is finally completed physical therapy she can get her hand on top of her head she has some mild weakness no significant pain she is basically now have returned to nearly normal function she can return to normal activities as tolerated follow up as needed

## 2013-03-07 NOTE — Evaluation (Signed)
Occupational Therapy Discharge   Patient Details  Name: [name] [name] MRN: [id number] Date of Birth: March 21, 1956  Today's Date: 03/06/2013 Time: 1516-1600 OT Time Calculation (min): 44 min Reassessment 1516-1530 (14') Manual 1530-1550 (10') TherExercise 1550-1600 (10')  Visit#: 12 of 12  Re-eval:    Assessment Diagnosis: left proximal humerus fx  Past Medical History:  Past Medical History  Diagnosis Date  . CAD (coronary artery disease) 2006    DES to LAD  . Chest pain   . HTN (hypertension)   . Hyperlipidemia   . GERD (gastroesophageal reflux disease)   . Edema     legs  . Anserine bursitis   . Knee pain   . Cardiomegaly   . Pulmonary embolism   . Kidney stones   . Complication of anesthesia   . PONV (postoperative nausea and vomiting)   . MI (myocardial infarction) 2005  . Breast cancer     left lumpectomy   Past Surgical History:  Past Surgical History  Procedure Laterality Date  . Colonoscopy with cold snare and snare cautery polypectomy    . Cardiac catheterization      DES to LAD  . Cholecystectomy    . Lymphadenectomy    . Appendectomy    . Tonsilectomy, adenoidectomy, bilateral myringotomy and tubes    . Cystectomy      rt. palm  . C/s tubal ligation    . Stent implant    . Tonsillectomy    . Coronary angioplasty  2005    1 stent  . Breast lumpectomy      left  . Tubal ligation      with c-section  . Hysteroscopy w/d&c  03/28/2012    Procedure: DILATATION AND CURETTAGE /HYSTEROSCOPY;  Surgeon: Lazaro Arms, MD;  Location: [facility];  Service: Gynecology;  Laterality: N/A;    Subjective Symptoms/Limitations Symptoms: S:  I am having a little more pain in my neck again Pain Assessment Currently in Pain?: Yes Pain Score: 3  Pain Location: Shoulder Pain Orientation: Left Pain Type: Acute pain Multiple Pain Sites: No     Assessment  Additional Assessments LUE AROM (degrees) LUE Overall AROM Comments: Cues for depressing shoulder as  compensatory tech for Range of motion  Left Shoulder Flexion: 136 Degrees (130) Left Shoulder ABduction: 138 Degrees (115) Left Shoulder Internal Rotation: 90 Degrees (90) Left Shoulder External Rotation: 44 Degrees (15) LUE Strength Left Shoulder Flexion: 3+/5 Left Shoulder ABduction: 3+/5 Left Shoulder Internal Rotation: 3+/5 Left Shoulder External Rotation: 3/5 Palpation Palpation: Min fascial restrictions in left upper arm, trapezious and scapularis region.      Exercise/Treatments   Seated Protraction: Strengthening;10 reps Protraction Weight (lbs): 1 Horizontal ABduction: Strengthening;10 reps Horizontal ABduction Weight (lbs): 1 External Rotation: Strengthening;10 reps External Rotation Weight (lbs): 1 Internal Rotation: Strengthening;10 reps Internal Rotation Weight (lbs): 1 Flexion: Strengthening;10 reps Flexion Weight (lbs): 1 Abduction: Strengthening;10 reps ABduction Weight (lbs): 1        Stretches Corner Stretch: 5 reps;10 seconds (educated on this date with return demo for cont HEP)            Manual Therapy Manual Therapy: Myofascial release Myofascial Release: MFR to left upper arm, trapezious, and scapularis region to decrease fascial restrictions and increase joint mobility in a pain free zone  Occupational Therapy Assessment and Plan OT Assessment and Plan Clinical Impression Statement: A: Patient states she feels like she will never be 100% with this arm as this is the 3rd injury she has  had to this side. She demonstrates improved range and strength however continues with increased difficulty with fatigue for sustained reaching tasks (ie. rolling hair). She reports increased ability to utilize UE in IADl tasks with greater ease.  She has met 3/5STG and 1/4 LTG with continued fascial restrictions and light pain.   OT Plan: P: Discharge from skiled OT services at this time with recommendation to continue HEP to include new corner stretches.      Goals Short Term Goals Short Term Goal 1: Patient will be educated on HEP.  Short Term Goal 1 Progress: Met Short Term Goal 2: Patient will increase PROM to [facility] to decrease difficulty with donning and doffing shirts.  Short Term Goal 2 Progress: Met Short Term Goal 3: Patient will increase shoulder strength to 3+/5 to increase ability to pull pants over hips.  Short Term Goal 3 Progress: Met Short Term Goal 4: Patient will have 0/10 pain when completing daily activities.  Short Term Goal 4 Progress: Progressing toward goal Short Term Goal 5: Patient will decrease fascial restrictions in left arm to trace to increase ability to reach up overhead. Short Term Goal 5 Progress: Progressing toward goal Long Term Goals Long Term Goal 1: Patient will return to highest level of independence with all daily, leisure, and work activities.  Long Term Goal 1 Progress: Progressing toward goal Long Term Goal 2: Patient will increase AROM WNL to increase ability to complete daily activities.  Long Term Goal 2 Progress: Met Long Term Goal 3: Patient will increase shoulder strength to 4/5 to be able to lift heavy items at work overhead. Long Term Goal 3 Progress: Progressing toward goal Long Term Goal 4: Patient will decrease fascial restrictions in left arm to zero  to increase ability to reach up overhead. Long Term Goal 4 Progress: Progressing toward goal  Problem List Patient Active Problem List   Diagnosis Date Noted  . Pain in joint, shoulder region 01/21/2013  . Proximal humerus fracture 11/20/2012  . Muscle weakness (generalized) 01/12/2011  . Shoulder pain 01/04/2011  . Neck pain 01/04/2011  . Left tennis elbow 01/04/2011  . CAD (coronary artery disease) 12/10/2010  . Pulmonary embolus 11/26/2010    Class: History of  . BREAST CANCER 12/18/2009  . HYPERLIPIDEMIA 12/18/2009  . CAD 12/18/2009  . GERD 12/18/2009  . EDEMA 12/18/2009  . CHEST PAIN 12/18/2009  . HYPERTENSION, HX OF  12/18/2009  . KNEE PAIN 02/11/2008  . ANSERINE BURSITIS 02/11/2008    End of Session Activity Tolerance: Patient tolerated treatment well General Behavior During Therapy: [facility] for tasks assessed/performed  GO    Velora Mediate, OTR/L  03/06/2013, 05:16PM  Physician Documentation Your signature is required to indicate approval of the treatment plan as stated above.  Please sign and either send electronically or make a copy of this report for your files and return this physician signed original.  Please mark one 1.__approve of plan  2. ___approve of plan with the following conditions.   ______________________________                                                          _____________________ Physician Signature  Date  

## 2013-03-10 NOTE — Telephone Encounter (Signed)
[facility] submitted referral order on 12/27/12 for Home Health services.

## 2013-03-11 ENCOUNTER — Other Ambulatory Visit: Payer: Self-pay | Admitting: *Deleted

## 2013-03-11 MED ORDER — METOPROLOL TARTRATE 25 MG PO TABS: 25.0000 mg | ORAL_TABLET | Freq: Every day | ORAL | Status: DC

## 2013-03-13 ENCOUNTER — Other Ambulatory Visit (INDEPENDENT_AMBULATORY_CARE_PROVIDER_SITE_OTHER): Payer: Self-pay | Admitting: Surgery

## 2013-03-13 DIAGNOSIS — Z853 Personal history of malignant neoplasm of breast: Secondary | ICD-10-CM

## 2013-03-14 ENCOUNTER — Other Ambulatory Visit: Payer: Self-pay

## 2013-04-01 ENCOUNTER — Ambulatory Visit
Admission: RE | Admit: 2013-04-01 | Discharge: 2013-04-01 | Disposition: A | Discharge status: Home / Self Care | Payer: Managed Care, Other (non HMO) | Source: Ambulatory Visit | Attending: Surgery | Admitting: Surgery

## 2013-04-01 ENCOUNTER — Other Ambulatory Visit (INDEPENDENT_AMBULATORY_CARE_PROVIDER_SITE_OTHER): Payer: Self-pay | Admitting: Surgery

## 2013-04-01 DIAGNOSIS — Z853 Personal history of malignant neoplasm of breast: Secondary | ICD-10-CM

## 2013-04-26 ENCOUNTER — Ambulatory Visit (INDEPENDENT_AMBULATORY_CARE_PROVIDER_SITE_OTHER): Payer: Managed Care, Other (non HMO) | Admitting: Cardiovascular Disease

## 2013-04-26 ENCOUNTER — Encounter: Payer: Self-pay | Admitting: Cardiovascular Disease

## 2013-04-26 VITALS — BP 140/80 | HR 80 | Ht 62.0 in | Wt 271.0 lb

## 2013-04-26 DIAGNOSIS — S42309S Unspecified fracture of shaft of humerus, unspecified arm, sequela: Secondary | ICD-10-CM

## 2013-04-26 DIAGNOSIS — I2510 Atherosclerotic heart disease of native coronary artery without angina pectoris: Secondary | ICD-10-CM

## 2013-04-26 DIAGNOSIS — S42202S Unspecified fracture of upper end of left humerus, sequela: Secondary | ICD-10-CM

## 2013-04-26 DIAGNOSIS — E785 Hyperlipidemia, unspecified: Secondary | ICD-10-CM

## 2013-04-26 MED ORDER — METOPROLOL TARTRATE 25 MG PO TABS: 25.0000 mg | ORAL_TABLET | Freq: Every day | ORAL | Status: DC

## 2013-04-26 NOTE — Progress Notes (Signed)
Patient ID: [name] [name], female   DOB: 11/13/1955, 57 y.o.   MRN: [id number] [name] [name] returns today for follow-up. 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 She has had follow-up breast cancer treatment. She had a lumpectomy and adjuvant therapy including chemo. She has a mamogram scheduled next month and this year marks her 6 year survival She has had previous cardiomegaly. She is a previous smoker with hyperlipidemia and coronary disease. She had a stent to the LAD in May 2006. It was a drug-eluting stent. Marland KitchenHe anginal equivalent in past has been neck shoulder and back pain. She has had separate left shoulder issues and seen ortho with PT/OT. Had had more chest/shoulder pain radiating to neck. Seen in [facility] ER 3/13 with GI illness. CXR ok. Pain in chest and shoulder at rest not exertion. Has not taken nitro  F/U stress test was normal   Myovue 8/13 normal EF 67%    Reviewed labs from Dr Juanetta Gosling 4/13  LDL 64 and normal LFT;s  Fractured left humerus in July Completing rehab   ROS: Denies fever, malais, weight loss, blurry vision, decreased visual acuity, cough, sputum, SOB, hemoptysis, pleuritic pain, palpitaitons, heartburn, abdominal pain, melena, lower extremity edema, claudication, or rash.  All other systems reviewed and negative  General: Affect appropriate Overweight white female  HEENT: normal Neck supple with no adenopathy JVP normal no bruits no thyromegaly Lungs clear with no wheezing and good diaphragmatic motion Heart:  S1/S2 no murmur, no rub, gallop or click PMI normal Abdomen: benighn, BS positve, no tenderness, no AAA no bruit.  No HSM or HJR Distal pulses intact with no bruits No edema Neuro non-focal Skin warm and dry No muscular weakness mild limitation ROM LUE   Current Outpatient Prescriptions  Medication Sig Dispense Refill  . aspirin EC 81 MG tablet Take 81 mg by mouth daily.      . Cholecalciferol (VITAMIN D) 2000 UNITS CAPS Take 1 capsule by mouth daily.      Marland Kitchen  docusate sodium (COLACE) 100 MG capsule Take 100 mg by mouth daily as needed. constipation      . ezetimibe (ZETIA) 10 MG tablet Take 10 mg by mouth at bedtime.       . fish oil-omega-3 fatty acids 1000 MG capsule Take 2 g by mouth 2 (two) times daily.        Marland Kitchen gemfibrozil (LOPID) 600 MG tablet Take 600 mg by mouth 2 (two) times daily before a meal.      . loratadine (CLARITIN) 10 MG tablet Take 10 mg by mouth daily as needed.      . meloxicam (MOBIC) 7.5 MG tablet 1 po bid with food  12 tablet  0  . metoprolol tartrate (LOPRESSOR) 25 MG tablet Take 1 tablet (25 mg total) by mouth daily. Take 1 tab daily  90 tablet  0  . Multiple Vitamin (MULTIVITAMIN) capsule Take 0.5 capsules by mouth 2 (two) times daily.       . nitroGLYCERIN (NITROSTAT) 0.4 MG SL tablet Place 1 tablet (0.4 mg total) under the tongue every 5 (five) minutes as needed for chest pain.  90 tablet  3  . nitroGLYCERIN (NITROSTAT) 0.4 MG SL tablet Place 0.4 mg under the tongue every 5 (five) minutes as needed. As needed for chest pain      . oxyCODONE-acetaminophen (PERCOCET/ROXICET) 5-325 MG per tablet 1 or 2 po q6h prn pain  30 tablet  0  . rosuvastatin (CRESTOR) 20 MG tablet Take 20 mg  by mouth at bedtime.       . vitamin B-12 (CYANOCOBALAMIN) 500 MCG tablet Take 500 mcg by mouth daily.         No current facility-administered medications for this visit.    Allergies  Codeine; Duricef; Pentazocine lactate; Percocet; and Vicodin  Electrocardiogram:  11/18  SR rate 76 voltage for LVH  Today SR rate 80 LVH no change   Assessment and Plan

## 2013-04-26 NOTE — Assessment & Plan Note (Signed)
Continue rehab for ROM  Get alignment with no surgery  Improving

## 2013-04-26 NOTE — Patient Instructions (Signed)
The current medical regimen is effective;  continue present plan and medications.  Follow up in 1 year with Dr Eden Emms.  You will receive a letter in the mail 2 months before you are due.  Please call us when you receive this letter to schedule your follow up appointment.

## 2013-04-26 NOTE — Assessment & Plan Note (Signed)
Stable with no angina and good activity level.  Continue medical Rx Refill on metoprolol called in

## 2013-04-26 NOTE — Assessment & Plan Note (Signed)
LDL in 70 range per patient Labs with [name] [name] 11/14

## 2014-03-05 ENCOUNTER — Other Ambulatory Visit: Payer: Self-pay

## 2014-03-05 DIAGNOSIS — Z853 Personal history of malignant neoplasm of breast: Secondary | ICD-10-CM

## 2014-03-05 DIAGNOSIS — Z1231 Encounter for screening mammogram for malignant neoplasm of breast: Secondary | ICD-10-CM

## 2014-03-28 ENCOUNTER — Encounter: Payer: Self-pay | Admitting: Nutrition

## 2014-03-28 ENCOUNTER — Encounter: Payer: Managed Care, Other (non HMO) | Attending: Pulmonary Disease | Admitting: Nutrition

## 2014-03-28 VITALS — Ht 62.5 in | Wt 265.8 lb

## 2014-03-28 DIAGNOSIS — E1165 Type 2 diabetes mellitus with hyperglycemia: Secondary | ICD-10-CM

## 2014-03-28 DIAGNOSIS — IMO0002 Reserved for concepts with insufficient information to code with codable children: Secondary | ICD-10-CM

## 2014-03-28 DIAGNOSIS — Z713 Dietary counseling and surveillance: Secondary | ICD-10-CM | POA: Diagnosis not present

## 2014-03-28 DIAGNOSIS — E118 Type 2 diabetes mellitus with unspecified complications: Secondary | ICD-10-CM | POA: Diagnosis present

## 2014-03-28 NOTE — Progress Notes (Signed)
  Medical Nutrition Therapy:  Appt start time: 0800 end time:  0900.   Assessment:  Primary concerns today: DIabetes. LIves with her adult son. Works full times. Most recent A1C . Has been checking her blood sugars.106-143 mg/dl. PM blood sugares 90-upper 100's. She has changed her diet by cutting out sweets, cookies, candy. She is trying to walk more at work and will start using her exercise bike. Strong family history of diabetes. Has lost 17 lbs since she saw Dr. Wolfgang Phoenix almost a month ago. Feels better and has more energy. Recently fell and hurt her left foot and has it in a little foot boot. To see podiatrist this week. She attended the Community Diabetes Class this past week at [facility]. She is not on any diabetes medications at present due to wanting to try diabetes and weight loss first before medications.   Preferred Learning Style:    No preference indicated    Learning Readiness:   Not ready  Contemplating  Ready  Change in progress   MEDICATIONS: see list   DIETARY INTAKE:     24-hr recall:  B ( AM): sandwich thins bread, 1 egg, precooked bacon 3 slices, 1/2 slice of cheese, water  OR Adkins shake Snk ( AM): none  L ( PM): [place] sandwich on ww bread, no mayo, 10 chips, water, Snk ( PM): none D ( PM): Chili small from [place] and 1/2 pecan salad from [place], water Snk ( PM): none Beverages: water Occasionally diet mt dew  Usual physical activity:     Estimated energy needs: 1500 Calories 170 g carbohydrates 112 g protein 42 g fat  Progress Towards Goal(s):  In progress.   Nutritional Diagnosis:  NB-1.1 Food and nutrition-related knowledge deficit As related to Diabetes.  As evidenced by A1C of 8.3%..    Intervention:  Nutrition counseling and diabetes education.  Plan:  Aim for 2-3 Carb Choices per meal (30-45 grams) +/- 1 either way  Avoid snacks between meals. Include protein in moderation with your meals and snacks Consider reading  food labels for Total Carbohydrate and Fat Grams of foods Consider  increasing your activity level by 30- 60 minutes most days of the week. Test blood fasting and 2hrs  After supper or bedtime. Goals 1-2 lbs per week. 2. Get A1C below 7% in three months.  Teaching Method Utilized:  Visual Auditory Hands on  Handouts given during visit include: Carb Counting and Food Label handouts Meal Plan Card The Plate Method    Barriers to learning/adherence to lifestyle change: none  Demonstrated degree of understanding via:  Teach Back   Monitoring/Evaluation:  Dietary intake, exercise, meal planning, SBG, and body weight in 1 month(s).

## 2014-03-28 NOTE — Patient Instructions (Signed)
Plan:  Aim for 2-3 Carb Choices per meal (30-45 grams) +/- 1 either way  Avoid snacks between meals. Include protein in moderation with your meals and snacks Consider reading food labels for Total Carbohydrate and Fat Grams of foods Consider  increasing your activity level by 30- 60 minutes most days of the week. Test blood fasting and 2hrs  After supper or bedtime. Goals 1-2 lbs per week. 2. Get A1C below 7% in three months

## 2014-04-02 ENCOUNTER — Ambulatory Visit: Payer: Managed Care, Other (non HMO)

## 2014-04-08 ENCOUNTER — Ambulatory Visit
Admission: RE | Admit: 2014-04-08 | Discharge: 2014-04-08 | Disposition: A | Discharge status: Home / Self Care | Payer: Managed Care, Other (non HMO) | Source: Ambulatory Visit

## 2014-04-08 DIAGNOSIS — Z1231 Encounter for screening mammogram for malignant neoplasm of breast: Secondary | ICD-10-CM

## 2014-04-08 DIAGNOSIS — Z853 Personal history of malignant neoplasm of breast: Secondary | ICD-10-CM

## 2014-04-16 ENCOUNTER — Encounter: Payer: Self-pay | Admitting: Cardiovascular Disease

## 2014-04-16 ENCOUNTER — Ambulatory Visit (INDEPENDENT_AMBULATORY_CARE_PROVIDER_SITE_OTHER): Payer: Managed Care, Other (non HMO) | Admitting: Cardiovascular Disease

## 2014-04-16 VITALS — BP 134/82 | HR 85 | Ht 63.0 in | Wt 265.1 lb

## 2014-04-16 DIAGNOSIS — Z8679 Personal history of other diseases of the circulatory system: Secondary | ICD-10-CM

## 2014-04-16 DIAGNOSIS — E119 Type 2 diabetes mellitus without complications: Secondary | ICD-10-CM | POA: Insufficient documentation

## 2014-04-16 DIAGNOSIS — I2510 Atherosclerotic heart disease of native coronary artery without angina pectoris: Secondary | ICD-10-CM

## 2014-04-16 DIAGNOSIS — E785 Hyperlipidemia, unspecified: Secondary | ICD-10-CM

## 2014-04-16 DIAGNOSIS — E1140 Type 2 diabetes mellitus with diabetic neuropathy, unspecified: Secondary | ICD-10-CM

## 2014-04-16 NOTE — Patient Instructions (Signed)
Your physician wants you to follow-up in:   [place] will receive a reminder letter in the mail two months in advance. If you don't receive a letter, please call our office to schedule the follow-up appointment. Your physician recommends that you continue on your current medications as directed. Please refer to the Current Medication list given to you today.  DR  [facility]  [place]  IN [facility] OFFICE   [phone]

## 2014-04-16 NOTE — Assessment & Plan Note (Signed)
Starting to eat better Previously too many sweets and carbs  Down 20 lbs last 2 months but still has long way to go Has seen eye doctor in May  F/U A1c [place]  Discuss starting ACE for renal protection

## 2014-04-16 NOTE — Assessment & Plan Note (Signed)
Continue statin labs with Dr Luan Pulling

## 2014-04-16 NOTE — Assessment & Plan Note (Signed)
Well controlled.  Continue current medications and low sodium Dash type diet.   Ask Dr Luan Pulling about starting ARB/ACE for renal protection given DM

## 2014-04-16 NOTE — Assessment & Plan Note (Signed)
Stable with no angina and good activity level.  Continue medical Rx  

## 2014-04-16 NOTE — Progress Notes (Signed)
Patient ID: [name] [name], female   DOB: 1955/09/03, 58 y.o.   MRN: [id number]   [name] [name] returns today for follow-up. 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 She has had follow-up breast cancer treatment. She had a lumpectomy and adjuvant therapy including chemo. She has a mamogram scheduled next month and this year marks her 6 year survival She has had previous cardiomegaly. She is a previous smoker with hyperlipidemia and coronary disease. She had a stent to the LAD in May 2006. It was a drug-eluting stent. Marland KitchenHe anginal equivalent in past has been neck shoulder and back pain. She has had separate left shoulder issues and seen ortho with PT/OT. Had had more chest/shoulder pain radiating to neck. Seen in [facility] ER 3/13 with GI illness. CXR ok. Pain in chest and shoulder at rest not exertion. Has not taken nitro F/U stress test was normal   Myovue 8/13 normal EF 67%   Reviewed labs from Dr [name] [name] 4/13  LDL 64 and normal LFT;s  Fully recovered from left humeral fracture  And fractured left foot now  Lost some weight but really needs to adhere to low carb diet     ROS: Denies fever, malais, weight loss, blurry vision, decreased visual acuity, cough, sputum, SOB, hemoptysis, pleuritic pain, palpitaitons, heartburn, abdominal pain, melena, lower extremity edema, claudication, or rash.  All other systems reviewed and negative   General: Affect appropriate Obese white femlae  HEENT: normal Neck supple with no adenopathy JVP normal no bruits no thyromegaly Lungs clear with no wheezing and good diaphragmatic motion Heart:  S1/S2 no murmur,rub, gallop or click PMI normal Abdomen: benighn, BS positve, no tenderness, no AAA no bruit.  No HSM or HJR Distal pulses intact with no bruits No edema Neuro non-focal Skin warm and dry Boot on left fractured foot   Medications Current Outpatient Prescriptions  Medication Sig Dispense Refill  . aspirin EC 81 MG tablet Take 81 mg by mouth daily.    . Blood  Glucose Monitoring Suppl (ONE TOUCH ULTRA MINI) W/DEVICE KIT   0  . Cholecalciferol (VITAMIN D) 2000 UNITS CAPS Take 1 capsule by mouth daily.    Marland Kitchen docusate sodium (COLACE) 100 MG capsule Take 100 mg by mouth daily as needed. constipation    . ezetimibe (ZETIA) 10 MG tablet Take 10 mg by mouth at bedtime.     . fish oil-omega-3 fatty acids 1000 MG capsule Take 2 g by mouth 2 (two) times daily.      Marland Kitchen gemfibrozil (LOPID) 600 MG tablet Take 600 mg by mouth 2 (two) times daily before a meal.    . loratadine (CLARITIN) 10 MG tablet Take 10 mg by mouth daily as needed.    . metoprolol tartrate (LOPRESSOR) 25 MG tablet Take 1 tablet (25 mg total) by mouth daily. Take 1 tab daily 90 tablet 3  . Multiple Vitamin (MULTIVITAMIN) capsule Take 0.5 capsules by mouth 2 (two) times daily.     . nitroGLYCERIN (NITROSTAT) 0.4 MG SL tablet Place 0.4 mg under the tongue every 5 (five) minutes as needed for chest pain.    . ONE TOUCH ULTRA TEST test strip   0  . ONETOUCH DELICA LANCETS 80H MISC   0  . rosuvastatin (CRESTOR) 20 MG tablet Take 20 mg by mouth at bedtime.     . vitamin B-12 (CYANOCOBALAMIN) 500 MCG tablet Take 500 mcg by mouth daily.       No current facility-administered medications for this visit.  Allergies Codeine; Duricef; Pentazocine lactate; Percocet; and Vicodin  Family History: Family History  Problem Relation Age of Onset  . Coronary artery disease    . Thrombophlebitis    . Arthritis    . Cancer    . Lung disease    . Asthma    . Diabetes      Social History: History   Social History  . Marital Status: Divorced    Spouse Name: N/A    Number of Children: N/A  . Years of Education: N/A   Occupational History  . Not on file.   Social History Main Topics  . Smoking status: Former Smoker -- 1.50 packs/day for 35 years    Types: Cigarettes    Quit date: 03/22/2004  . Smokeless tobacco: Not on file  . Alcohol Use: No  . Drug Use: No  . Sexual Activity: Yes    Birth  Control/ Protection: Surgical   Other Topics Concern  . Not on file   Social History Narrative    Past Surgical History  Procedure Laterality Date  . Colonoscopy with cold snare and snare cautery polypectomy    . Cardiac catheterization      DES to LAD  . Cholecystectomy    . Lymphadenectomy    . Appendectomy    . Tonsilectomy, adenoidectomy, bilateral myringotomy and tubes    . Cystectomy      rt. palm  . C/s tubal ligation    . Stent implant    . Tonsillectomy    . Coronary angioplasty  2005    1 stent  . Breast lumpectomy      left  . Tubal ligation      with c-section  . Hysteroscopy w/d&c  03/28/2012    Procedure: DILATATION AND CURETTAGE /HYSTEROSCOPY;  Surgeon: Florian Buff, MD;  Location: [facility];  Service: Gynecology;  Laterality: N/A;    Past Medical History  Diagnosis Date  . CAD (coronary artery disease) 2006    DES to LAD  . Chest pain   . HTN (hypertension)   . Hyperlipidemia   . GERD (gastroesophageal reflux disease)   . Edema     legs  . Anserine bursitis   . Knee pain   . Cardiomegaly   . Pulmonary embolism   . Kidney stones   . Complication of anesthesia   . PONV (postoperative nausea and vomiting)   . MI (myocardial infarction) 2005  . Breast cancer     left lumpectomy  . Diabetes mellitus without complication     Electrocardiogram:  SR LVH  2014 today NSR rate 85 no LVH   Assessment and Plan

## 2014-04-24 ENCOUNTER — Other Ambulatory Visit: Payer: Self-pay | Admitting: *Deleted

## 2014-04-24 MED ORDER — NITROGLYCERIN 0.4 MG SL SUBL: 0.4000 mg | SUBLINGUAL_TABLET | SUBLINGUAL | Status: DC | PRN

## 2014-04-25 ENCOUNTER — Other Ambulatory Visit: Payer: Self-pay | Admitting: *Deleted

## 2014-04-25 MED ORDER — NITROGLYCERIN 0.4 MG SL SUBL: 0.4000 mg | SUBLINGUAL_TABLET | SUBLINGUAL | Status: DC | PRN

## 2014-05-05 ENCOUNTER — Ambulatory Visit: Payer: Managed Care, Other (non HMO) | Admitting: Nutrition

## 2014-05-07 ENCOUNTER — Encounter: Payer: Self-pay | Admitting: Nutrition

## 2014-05-07 ENCOUNTER — Encounter: Payer: Managed Care, Other (non HMO) | Attending: Pulmonary Disease | Admitting: Nutrition

## 2014-05-07 VITALS — Ht 62.0 in | Wt 259.0 lb

## 2014-05-07 DIAGNOSIS — Z713 Dietary counseling and surveillance: Secondary | ICD-10-CM | POA: Insufficient documentation

## 2014-05-07 DIAGNOSIS — IMO0002 Reserved for concepts with insufficient information to code with codable children: Secondary | ICD-10-CM

## 2014-05-07 DIAGNOSIS — E1165 Type 2 diabetes mellitus with hyperglycemia: Secondary | ICD-10-CM

## 2014-05-07 DIAGNOSIS — E118 Type 2 diabetes mellitus with unspecified complications: Secondary | ICD-10-CM | POA: Diagnosis present

## 2014-05-07 DIAGNOSIS — E669 Obesity, unspecified: Secondary | ICD-10-CM

## 2014-05-07 NOTE — Patient Instructions (Signed)
Plan:  Great job!! Keep up the good work. Aim for 2-3 Carb Choices per meal (30-45 grams) +/- 1 either way  Avoid snacks between meals. Include protein in moderation with your meals and snacks Consider reading food labels for Total Carbohydrate and Fat Grams of foods Consider  increasing your activity level by 30- 60 minutes most days of the week when you are allowed once your foot heals. Test blood fasting and 2 hrs  After supper or  At bedtime occasionally. Goals Lose 1-2 lbs per week. 2. Get A1C below 7% in three months.

## 2014-05-07 NOTE — Progress Notes (Signed)
  Medical Nutrition Therapy:  Appt start time: 1630 end time:  1700  Assessment:  Primary concerns today: DIabetes.  Just saw Dr. Luan Pulling and he was pleased with her  BS numbers. FBS 84-116 mg/dl.  Will get A1C done the end of January.  Feels a lot better. Not as tired, thirsty or sluggish anymore. Has more energy. Broke left foot 2 months ago and now has boot on it.  Lost 6 lbs.  She has changed: cut out the snacks. Now eating bran flakes now instead of sausage biscuits and 1% milk. Drinks mostly water now and eating healthier foods at all meals. Not on any medication for her diabetes.     MEDICATIONS: see list   DIETARY INTAKE:     24-hr recall:  B ( AM): Bran flakes with 1% milk and 1 tbs of raisins OR adkins drink Snk ( AM): none  L ( PM):  [place] sandwich with ww bread, fruit, water Snk ( PM): none D ( PM): Green beans, grilled chicken, 1/2 c corn, water Snk ( PM): none Beverages: water Occasionally diet mt dew  Usual physical activity:  Has a boot on her foot.  Estimated energy needs: 1500 Calories 170 g carbohydrates 112 g protein 42 g fat  Progress Towards Goal(s):  In progress.   Nutritional Diagnosis:  NB-1.1 Food and nutrition-related knowledge deficit As related to Diabetes.  As evidenced by A1C of 8.3%..    Intervention:  Nutrition counseling and diabetes education.  Plan:  Great job!! Keep up the good work. Aim for 2-3 Carb Choices per meal (30-45 grams) +/- 1 either way  Avoid snacks between meals. Include protein in moderation with your meals and snacks Consider reading food labels for Total Carbohydrate and Fat Grams of foods Consider  increasing your activity level by 30- 60 minutes most days of the week when you are allowed once your foot heals. Test blood fasting and 2 hrs  After supper or  At bedtime occasionally. Goals Lose 1-2 lbs per week. 2. Get A1C below 7% in three months.  Teaching Method Utilized:  Visual Auditory Hands on  Handouts  given during visit include: Carb Counting and Food Label handouts Meal Plan Card The Plate Method    Barriers to learning/adherence to lifestyle change: none  Demonstrated degree of understanding via:  Teach Back   Monitoring/Evaluation:  Dietary intake, exercise, meal planning, SBG, and body weight in 2 month(s).

## 2014-06-18 ENCOUNTER — Other Ambulatory Visit: Payer: Self-pay | Admitting: Cardiovascular Disease

## 2014-07-18 ENCOUNTER — Ambulatory Visit: Payer: Managed Care, Other (non HMO) | Admitting: Nutrition

## 2014-08-01 ENCOUNTER — Ambulatory Visit: Payer: Managed Care, Other (non HMO) | Admitting: Nutrition

## 2014-08-15 ENCOUNTER — Encounter: Payer: Managed Care, Other (non HMO) | Attending: Pulmonary Disease | Admitting: Nutrition

## 2014-08-15 VITALS — Ht 64.0 in | Wt 254.2 lb

## 2014-08-15 DIAGNOSIS — Z6841 Body Mass Index (BMI) 40.0 and over, adult: Secondary | ICD-10-CM | POA: Diagnosis not present

## 2014-08-15 DIAGNOSIS — E118 Type 2 diabetes mellitus with unspecified complications: Secondary | ICD-10-CM | POA: Insufficient documentation

## 2014-08-15 DIAGNOSIS — Z713 Dietary counseling and surveillance: Secondary | ICD-10-CM | POA: Insufficient documentation

## 2014-08-15 NOTE — Progress Notes (Signed)
  Medical Nutrition Therapy:  Appt start time: 1600end time:  1615  Assessment:  Primary concerns today: DIabetes.  Lost 5 lbs. Feels a lot better.  Most recent A1C 6% in January. Excellent progress. Changes:  Cut out sweets, processed foods, junk food. Eating a lot more whole grains. Dried beans and fresh fruits and vegetables. Preplanning meals Eating more higher fiber foods and low carb foods. Even her son is eating healthier and has been losing weight and improving his health too.  FBS: 90-100's. BS log brought in.   Meds: No meds needed for Dm.  Has been doing some walking but admits to needing to do more once weather is nice and days are longer. Marland Kitchen MEDICATIONS: see list   DIETARY INTAKE:     24-hr recall:  B ( AM): Bran flakes with 1% milk and 1 tbs of raisins OR adkins drink, water Snk ( AM): none  L ( PM):  Sweet potatoes and broccoli and protein bar, water Snk ( PM): none D ( PM):  Baked potatoe and 4 chicken nuggets.  Snk ( PM): none Beverages: water Water Usual physical activity:  Starting to walk some.  Estimated energy needs: 1500 Calories 170 g carbohydrates 112 g protein 42 g fat  Progress Towards Goal(s):  In progress.   Nutritional Diagnosis:  NB-1.1 Food and nutrition-related knowledge deficit As related to Diabetes.  As evidenced by A1C of 8.3%..    Intervention:  Nutrition counseling and diabetes education. Meal planning, portion sizes, healthy tips for weight loss and reading food labels.  Plan:  Great job!! Keep up the good work. Aim for 2-3 Carb Choices per meal (30-45 grams) +/- 1 either way  Increase physical activity to 30-45 mins daily. Lose 1 -2 lbs per week. Keep A1C less than 6.5%. Continue to increase fresh fruits and low carb vegetables 3 servings per day!  Teaching Method Utilized:  Visual Auditory Hands on  Handouts given during visit include: Carb Counting and Food Label handouts Meal Plan Card The Plate Method    Barriers  to learning/adherence to lifestyle change: none  Demonstrated degree of understanding via:  Teach Back   Monitoring/Evaluation:  Dietary intake, exercise, meal planning, SBG, and body weight in 6 months.

## 2014-08-18 ENCOUNTER — Encounter: Payer: Self-pay | Admitting: Nutrition

## 2014-08-18 NOTE — Patient Instructions (Signed)
Plan:  Great job!! Keep up the good work. Aim for 2-3 Carb Choices per meal (30-45 grams) +/- 1 either way  Increase physical activity to 30-45 mins daily. Lose 1 -2 lbs per week. Keep A1C less than 6.5%.

## 2014-11-03 ENCOUNTER — Other Ambulatory Visit: Payer: Self-pay

## 2015-02-16 ENCOUNTER — Ambulatory Visit: Payer: Managed Care, Other (non HMO) | Admitting: Nutrition

## 2015-03-23 ENCOUNTER — Other Ambulatory Visit: Payer: Self-pay

## 2015-03-23 DIAGNOSIS — Z1231 Encounter for screening mammogram for malignant neoplasm of breast: Secondary | ICD-10-CM

## 2015-04-22 ENCOUNTER — Ambulatory Visit
Admission: RE | Admit: 2015-04-22 | Discharge: 2015-04-22 | Disposition: A | Discharge status: Home / Self Care | Payer: Managed Care, Other (non HMO) | Source: Ambulatory Visit

## 2015-04-22 DIAGNOSIS — Z1231 Encounter for screening mammogram for malignant neoplasm of breast: Secondary | ICD-10-CM

## 2015-05-22 ENCOUNTER — Encounter: Payer: Self-pay | Admitting: *Deleted

## 2015-05-25 NOTE — Progress Notes (Signed)
Patient ID: [name] [name], female   DOB: 1956/01/11, 60 y.o.   MRN: [id number]   Jolonda returns today for follow-up. 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 She has had follow-up breast cancer treatment. She had a lumpectomy and adjuvant therapy including chemo. She has a mamogram scheduled next month and this year marks her 10 year survival She has had previous cardiomegaly. She is a previous smoker with hyperlipidemia and coronary disease. She had a stent to the LAD in May 2006. It was a drug-eluting stent. Marland KitchenHe anginal equivalent in past has been neck shoulder and back pain. She has had separate left shoulder issues and seen ortho with PT/OT. Had had more chest/shoulder pain radiating to neck. Seen in [facility] ER 3/13 with GI illness. CXR ok. Pain in chest and shoulder at rest not exertion. Has not taken nitro F/U stress test was normal   Myovue 8/13 normal EF 67%   Reviewed labs from Dr Luan Pulling 4/13  LDL 64 and normal LFT;s  Recovered from left humeral fracture 2014 Fractured left foot 2015   Lost some weight but really needs to adhere to low carb diet   Primary Ed Hawkins follows labs   ROS: Denies fever, malais, weight loss, blurry vision, decreased visual acuity, cough, sputum, SOB, hemoptysis, pleuritic pain, palpitaitons, heartburn, abdominal pain, melena, lower extremity edema, claudication, or rash.  All other systems reviewed and negative   General: Affect appropriate Obese white femlae  HEENT: normal Neck supple with no adenopathy JVP normal no bruits no thyromegaly Lungs clear with no wheezing and good diaphragmatic motion Heart:  S1/S2 no murmur,rub, gallop or click PMI normal Abdomen: benighn, BS positve, no tenderness, no AAA no bruit.  No HSM or HJR Distal pulses intact with no bruits No edema Neuro non-focal Skin warm and dry Boot on left fractured foot   Medications Current Outpatient Prescriptions  Medication Sig Dispense Refill  . aspirin EC 81 MG tablet Take 81  mg by mouth daily.    . Blood Glucose Monitoring Suppl (ONE TOUCH ULTRA MINI) W/DEVICE KIT   0  . Cholecalciferol (VITAMIN D) 2000 UNITS CAPS Take 1 capsule by mouth daily.    Marland Kitchen docusate sodium (COLACE) 100 MG capsule Take 100 mg by mouth daily as needed. constipation    . ezetimibe (ZETIA) 10 MG tablet Take 10 mg by mouth at bedtime.     . fish oil-omega-3 fatty acids 1000 MG capsule Take 2 g by mouth 2 (two) times daily.      Marland Kitchen gemfibrozil (LOPID) 600 MG tablet Take 600 mg by mouth 2 (two) times daily before a meal.    . loratadine (CLARITIN) 10 MG tablet Take 10 mg by mouth daily as needed for allergies or rhinitis.     . metoprolol tartrate (LOPRESSOR) 25 MG tablet TAKE ONE TABLET BY MOUTH ONCE DAILY 90 tablet 3  . Multiple Vitamin (MULTIVITAMIN) capsule Take 1 capsule by mouth 2 (two) times daily.     . nitroGLYCERIN (NITROSTAT) 0.4 MG SL tablet Place 1 tablet (0.4 mg total) under the tongue every 5 (five) minutes as needed for chest pain. 25 tablet 3  . ONE TOUCH ULTRA TEST test strip   0  . ONETOUCH DELICA LANCETS 89F MISC   0  . rosuvastatin (CRESTOR) 20 MG tablet Take 20 mg by mouth at bedtime.     . vitamin B-12 (CYANOCOBALAMIN) 500 MCG tablet Take 500 mcg by mouth daily.       No current  facility-administered medications for this visit.    Allergies Codeine; Duricef; Pentazocine lactate; Percocet; and Vicodin  Family History: Family History  Problem Relation Age of Onset  . Coronary artery disease    . Thrombophlebitis    . Arthritis    . Cancer    . Lung disease    . Asthma    . Diabetes      Social History: Social History   Social History  . Marital Status: Divorced    Spouse Name: N/A  . Number of Children: N/A  . Years of Education: N/A   Occupational History  . Not on file.   Social History Main Topics  . Smoking status: Former Smoker -- 1.50 packs/day for 35 years    Types: Cigarettes    Quit date: 03/22/2004  . Smokeless tobacco: Not on file  .  Alcohol Use: No  . Drug Use: No  . Sexual Activity: Yes    Birth Control/ Protection: Surgical   Other Topics Concern  . Not on file   Social History Narrative    Past Surgical History  Procedure Laterality Date  . Colonoscopy with cold snare and snare cautery polypectomy    . Cardiac catheterization      DES to LAD  . Cholecystectomy    . Lymphadenectomy    . Appendectomy    . Tonsilectomy, adenoidectomy, bilateral myringotomy and tubes    . Cystectomy      rt. palm  . C/s tubal ligation    . Stent implant    . Tonsillectomy    . Coronary angioplasty  2005    1 stent  . Breast lumpectomy      left  . Tubal ligation      with c-section  . Hysteroscopy w/d&c  03/28/2012    Procedure: DILATATION AND CURETTAGE /HYSTEROSCOPY;  Surgeon: Florian Buff, MD;  Location: [facility];  Service: Gynecology;  Laterality: N/A;    Past Medical History  Diagnosis Date  . CAD (coronary artery disease) 2006    DES to LAD  . Chest pain   . HTN (hypertension)   . Hyperlipidemia   . GERD (gastroesophageal reflux disease)   . Edema     legs  . Anserine bursitis   . Knee pain   . Cardiomegaly   . Pulmonary embolism ([place])   . Kidney stones   . Complication of anesthesia   . PONV (postoperative nausea and vomiting)   . MI (myocardial infarction) ([place]) 2005  . Breast cancer ([place])     left lumpectomy  . Diabetes mellitus without complication ([place])     Electrocardiogram:   2015 SR LVH  2014 today NSR rate 85 no LVH  SR rate 74  LVH no changes   Assessment and Plan CAD:  Stent LAD 2006 Last myvovue reviewed and normal in 2013   Chol: labs with primary on statin  Cardiomegaly: normal EF no volume overload f/u EF next year  HTN: Well controlled.  Continue current medications and low sodium Dash type diet.   Breast Cancer yearly mammogram 10 years survival post Rx    Jenkins Rouge

## 2015-05-26 ENCOUNTER — Encounter: Payer: Self-pay | Admitting: Cardiovascular Disease

## 2015-05-26 ENCOUNTER — Ambulatory Visit (INDEPENDENT_AMBULATORY_CARE_PROVIDER_SITE_OTHER): Payer: Managed Care, Other (non HMO) | Admitting: Cardiovascular Disease

## 2015-05-26 VITALS — BP 134/92 | HR 76 | Ht 64.0 in | Wt 265.0 lb

## 2015-05-26 DIAGNOSIS — Z8679 Personal history of other diseases of the circulatory system: Secondary | ICD-10-CM

## 2015-05-26 DIAGNOSIS — I2510 Atherosclerotic heart disease of native coronary artery without angina pectoris: Secondary | ICD-10-CM | POA: Diagnosis not present

## 2015-05-26 MED ORDER — NITROGLYCERIN 0.4 MG SL SUBL: 0.4000 mg | SUBLINGUAL_TABLET | SUBLINGUAL | Status: DC | PRN

## 2015-05-26 NOTE — Patient Instructions (Signed)
Medication Instructions:  Your physician recommends that you continue on your current medications as directed. Please refer to the Current Medication list given to you today.  Labwork: NONE  Testing/Procedures: NONE  Follow-Up: Your physician wants you to follow-up in:1 year with Dr. Nishan. You will receive a reminder letter in the mail two months in advance. If you don't receive a letter, please call our office to schedule the follow-up appointment.   If you need a refill on your cardiac medications before your next appointment, please call your pharmacy.    

## 2015-08-26 ENCOUNTER — Other Ambulatory Visit: Payer: Self-pay | Admitting: Cardiovascular Disease

## 2015-10-25 ENCOUNTER — Emergency Department (HOSPITAL_COMMUNITY)
Admission: EM | Admit: 2015-10-25 | Discharge: 2015-10-25 | Disposition: A | Discharge status: Home / Self Care | Payer: Managed Care, Other (non HMO) | Attending: Emergency Medicine | Admitting: Emergency Medicine

## 2015-10-25 ENCOUNTER — Encounter (HOSPITAL_COMMUNITY): Payer: Self-pay | Admitting: Emergency Medicine

## 2015-10-25 ENCOUNTER — Emergency Department (HOSPITAL_COMMUNITY)
Admission: EM | Admit: 2015-10-25 | Discharge: 2015-10-25 | Disposition: A | Discharge status: Home / Self Care | Payer: Managed Care, Other (non HMO) | Source: Home / Self Care | Attending: Emergency Medicine | Admitting: Emergency Medicine

## 2015-10-25 DIAGNOSIS — I252 Old myocardial infarction: Secondary | ICD-10-CM | POA: Insufficient documentation

## 2015-10-25 DIAGNOSIS — E119 Type 2 diabetes mellitus without complications: Secondary | ICD-10-CM | POA: Insufficient documentation

## 2015-10-25 DIAGNOSIS — E785 Hyperlipidemia, unspecified: Secondary | ICD-10-CM | POA: Insufficient documentation

## 2015-10-25 DIAGNOSIS — R110 Nausea: Secondary | ICD-10-CM | POA: Diagnosis not present

## 2015-10-25 DIAGNOSIS — I10 Essential (primary) hypertension: Secondary | ICD-10-CM

## 2015-10-25 DIAGNOSIS — I2510 Atherosclerotic heart disease of native coronary artery without angina pectoris: Secondary | ICD-10-CM | POA: Insufficient documentation

## 2015-10-25 DIAGNOSIS — Z7982 Long term (current) use of aspirin: Secondary | ICD-10-CM | POA: Insufficient documentation

## 2015-10-25 DIAGNOSIS — Z87891 Personal history of nicotine dependence: Secondary | ICD-10-CM

## 2015-10-25 DIAGNOSIS — Z79899 Other long term (current) drug therapy: Secondary | ICD-10-CM | POA: Insufficient documentation

## 2015-10-25 DIAGNOSIS — Z853 Personal history of malignant neoplasm of breast: Secondary | ICD-10-CM

## 2015-10-25 DIAGNOSIS — N61 Mastitis without abscess: Secondary | ICD-10-CM

## 2015-10-25 LAB — BASIC METABOLIC PANEL
Anion gap: 8 (ref 5–15)
BUN: 10 mg/dL (ref 6–20)
CALCIUM: 9.2 mg/dL (ref 8.9–10.3)
CO2: 25 mmol/L (ref 22–32)
CREATININE: 0.59 mg/dL (ref 0.44–1.00)
Chloride: 101 mmol/L (ref 101–111)
GFR calc Af Amer: >60 mL/min (ref >60)
GFR calc non Af Amer: >60 mL/min (ref >60)
GLUCOSE: 173 mg/dL — AB (ref 65–99)
Potassium: 3.3 mmol/L — ABNORMAL LOW (ref 3.5–5.1)
Sodium: 134 mmol/L — ABNORMAL LOW (ref 135–145)

## 2015-10-25 LAB — CBC WITH DIFFERENTIAL/PLATELET
BASOS PCT: 0 %
Basophils Absolute: 0 10*3/uL (ref 0.0–0.1)
EOS ABS: 0 10*3/uL (ref 0.0–0.7)
EOS PCT: 0 %
HEMATOCRIT: 41.1 % (ref 36.0–46.0)
Hemoglobin: 13.7 g/dL (ref 12.0–15.0)
Lymphocytes Relative: 8 %
Lymphs Abs: 1 10*3/uL (ref 0.7–4.0)
MCH: 31.1 pg (ref 26.0–34.0)
MCHC: 33.3 g/dL (ref 30.0–36.0)
MCV: 93.2 fL (ref 78.0–100.0)
MONO ABS: 0.5 10*3/uL (ref 0.1–1.0)
MONOS PCT: 4 %
Neutro Abs: 11.2 10*3/uL — ABNORMAL HIGH (ref 1.7–7.7)
Neutrophils Relative %: 88 %
Platelets: 264 10*3/uL (ref 150–400)
RBC: 4.41 MIL/uL (ref 3.87–5.11)
RDW: 12.4 % (ref 11.5–15.5)
WBC: 12.7 10*3/uL — ABNORMAL HIGH (ref 4.0–10.5)

## 2015-10-25 LAB — LACTIC ACID, PLASMA: LACTIC ACID, VENOUS: 1.3 mmol/L (ref 0.5–2.0)

## 2015-10-25 MED ORDER — ONDANSETRON HCL 4 MG/2ML IJ SOLN
4.0000 mg | Freq: Once | INTRAMUSCULAR | Status: AC
  Administered 2015-10-25: 4 mg via INTRAVENOUS
  Filled 2015-10-25: qty 2

## 2015-10-25 MED ORDER — SODIUM CHLORIDE 0.9 % IV BOLUS (SEPSIS)
1000.0000 mL | Freq: Once | INTRAVENOUS | Status: AC
  Administered 2015-10-25: 1000 mL via INTRAVENOUS

## 2015-10-25 MED ORDER — SULFAMETHOXAZOLE-TRIMETHOPRIM 800-160 MG PO TABS
1.0000 | ORAL_TABLET | Freq: Once | ORAL | Status: AC
  Administered 2015-10-25: 1 via ORAL
  Filled 2015-10-25: qty 1

## 2015-10-25 MED ORDER — SULFAMETHOXAZOLE-TRIMETHOPRIM 800-160 MG PO TABS: 1.0000 | ORAL_TABLET | Freq: Two times a day (BID) | ORAL | Status: AC

## 2015-10-25 NOTE — ED Provider Notes (Signed)
CSN: [id number]     Arrival date & time 10/25/15  5102 History  By signing my name below, I, [name] [name], attest that this documentation has been prepared under the direction and in the presence of Nat Christen, MD. Electronically Signed: [name], ED Scribe. 10/25/2015. 9:08 AM.    Chief Complaint  Patient presents with  . Breast Problem    The history is provided by the patient. No language interpreter was used.    HPI Comments: [name] [name] is a 60 y.o. female with h/o breast cancer (2007) and DM who presents to the Emergency Department complaining of moderate pain and redness to th left breast,  onset this morning. Patient also has associated nausea. Pt reports that she has had similar symptoms one year ago, when she was diagnosed with cellulitis, which was treated with ? septra and steroids by her PCP.   Patient has diabetes which is controlled with her diet. Patient denies fever.  Past Medical History  Diagnosis Date  . CAD (coronary artery disease) 2006    DES to LAD  . Chest pain   . HTN (hypertension)   . Hyperlipidemia   . GERD (gastroesophageal reflux disease)   . Edema     legs  . Anserine bursitis   . Knee pain   . Cardiomegaly   . Pulmonary embolism ([place])   . Kidney stones   . Complication of anesthesia   . PONV (postoperative nausea and vomiting)   . MI (myocardial infarction) ([place]) 2005  . Breast cancer ([place])     left lumpectomy  . Diabetes mellitus without complication [facility])    Past Surgical History  Procedure Laterality Date  . Colonoscopy with cold snare and snare cautery polypectomy    . Cardiac catheterization      DES to LAD  . Cholecystectomy    . Lymphadenectomy    . Appendectomy    . Tonsilectomy, adenoidectomy, bilateral myringotomy and tubes    . Cystectomy      rt. palm  . C/s tubal ligation    . Stent implant    . Tonsillectomy    . Coronary angioplasty  2005    1 stent  . Breast lumpectomy      left  . Tubal ligation      with c-section   . Hysteroscopy w/d&c  03/28/2012    Procedure: DILATATION AND CURETTAGE /HYSTEROSCOPY;  Surgeon: Florian Buff, MD;  Location: [facility];  Service: Gynecology;  Laterality: N/A;   Family History  Problem Relation Age of Onset  . Coronary artery disease    . Thrombophlebitis    . Arthritis    . Cancer    . Lung disease    . Asthma    . Diabetes     Social History  Substance Use Topics  . Smoking status: Former Smoker -- 1.50 packs/day for 35 years    Types: Cigarettes    Quit date: 03/22/2004  . Smokeless tobacco: None  . Alcohol Use: No   OB History    No data available     Review of Systems A complete 10 system review of systems was obtained and all systems are negative except as noted in the HPI and PMH.   Allergies  Codeine; Duricef; Pentazocine lactate; Percocet; and Vicodin  Home Medications   Prior to Admission medications   Medication Sig Start Date End Date Taking? Authorizing Provider  aspirin EC 81 MG tablet Take 81 mg by mouth daily.  Historical Provider, MD  Blood Glucose Monitoring Suppl (ONE TOUCH ULTRA MINI) W/DEVICE KIT  02/28/14   Historical Provider, MD  Cholecalciferol (VITAMIN D) 2000 UNITS CAPS Take 1 capsule by mouth daily.    Historical Provider, MD  docusate sodium (COLACE) 100 MG capsule Take 100 mg by mouth daily as needed. constipation    Historical Provider, MD  ezetimibe (ZETIA) 10 MG tablet Take 10 mg by mouth at bedtime.     Historical Provider, MD  fish oil-omega-3 fatty acids 1000 MG capsule Take 2 g by mouth 2 (two) times daily.      Historical Provider, MD  gemfibrozil (LOPID) 600 MG tablet Take 600 mg by mouth 2 (two) times daily before a meal.    Historical Provider, MD  loratadine (CLARITIN) 10 MG tablet Take 10 mg by mouth daily as needed for allergies or rhinitis.     Historical Provider, MD  metoprolol tartrate (LOPRESSOR) 25 MG tablet TAKE ONE TABLET BY MOUTH ONCE DAILY 08/27/15   [name] Hector, MD  Multiple Vitamin (MULTIVITAMIN)  capsule Take 1 capsule by mouth 2 (two) times daily.     Historical Provider, MD  nitroGLYCERIN (NITROSTAT) 0.4 MG SL tablet Place 1 tablet (0.4 mg total) under the tongue every 5 (five) minutes as needed for chest pain. 05/26/15   [name] Hector, MD  ONE TOUCH ULTRA TEST test strip  04/11/14   Historical Provider, MD  [name] LANCETS 33L [place]  04/11/14   Historical Provider, MD  rosuvastatin (CRESTOR) 20 MG tablet Take 20 mg by mouth at bedtime.     Historical Provider, MD  sulfamethoxazole-trimethoprim (BACTRIM DS,SEPTRA DS) 800-160 MG tablet Take 1 tablet by mouth 2 (two) times daily. 10/25/15 11/01/15  Nat Christen, MD  vitamin B-12 (CYANOCOBALAMIN) 500 MCG tablet Take 500 mcg by mouth daily.      Historical Provider, MD   BP 157/88 mmHg  Pulse 115  Temp(Src) 98.9 F (37.2 C) (Oral)  Resp 18  Ht 5' 4"  (1.626 m)  Wt 250 lb (113.399 kg)  BMI 42.89 kg/m2  SpO2 100% Physical Exam  Constitutional: She is oriented to person, place, and time. She appears well-developed and well-nourished.  Obese, but no acute distress.  HENT:  Head: Normocephalic and atraumatic.  Eyes: Conjunctivae and EOM are normal. Pupils are equal, round, and reactive to light.  Neck: Normal range of motion. Neck supple.  Cardiovascular: Normal rate and regular rhythm.   Pulmonary/Chest: Effort normal and breath sounds normal.  Abdominal: Soft. Bowel sounds are normal.  Musculoskeletal: Normal range of motion.  Neurological: She is alert and oriented to person, place, and time.  Skin: Skin is warm and dry. There is erythema.  Chaperone present. Erythema on superior aspect of entire left breast but no petechiae.  Psychiatric: She has a normal mood and affect. Her behavior is normal.  Nursing note and vitals reviewed.   ED Course  Procedures (including critical care time) DIAGNOSTIC STUDIES: Oxygen Saturation is 100% on RA, normal by my interpretation.    COORDINATION OF CARE: 9:07 AM Discussed treatment plan  with pt at bedside which includes Rx of antibiotics and pt agreed to plan.  MDM   Final diagnoses:  Cellulitis of left breast    Patient is nontoxic and afebrile. She has had cellulitis in the left breast in the past. Will Rx Septra DS twice a day. She understands to return if worse.  I personally performed the services described in this documentation, which was scribed  in my presence. The recorded information has been reviewed and is accurate.     Nat Christen, MD 10/25/15 [phone]

## 2015-10-25 NOTE — ED Provider Notes (Signed)
CSN: [id number]     Arrival date & time 10/25/15  1715 History   First MD Initiated Contact with Patient 10/25/15 1737     Chief Complaint  Patient presents with  . Cellulitis     (Consider location/radiation/quality/duration/timing/severity/associated sxs/prior Treatment) HPI  60 year old female with past history of breast cancer with a left-sided lumpectomy as well as diabetes presents with left breast cellulitis. Seen here earlier this morning and given a dose of Bactrim and sent home with a prescription of Bactrim. She has had cellulitis like this before about one year ago. When she first woke up this morning she noticed that her breast was sore and noticed the redness. No drainage. Came in, was evaluated, and sent home. When she went home she developed a temperature of 100.8. She also noticed that her redness has gone beyond the skin marker that was placed in the ER. She has been feeling nauseated all day but no vomiting. Pain is not particularly worse. Tylenol has been helping pain, most recently took 3 hours ago.  Past Medical History  Diagnosis Date  . CAD (coronary artery disease) 2006    DES to LAD  . Chest pain   . HTN (hypertension)   . Hyperlipidemia   . GERD (gastroesophageal reflux disease)   . Edema     legs  . Anserine bursitis   . Knee pain   . Cardiomegaly   . Pulmonary embolism ([place])   . Kidney stones   . Complication of anesthesia   . PONV (postoperative nausea and vomiting)   . MI (myocardial infarction) ([place]) 2005  . Breast cancer ([place])     left lumpectomy  . Diabetes mellitus without complication [facility])    Past Surgical History  Procedure Laterality Date  . Colonoscopy with cold snare and snare cautery polypectomy    . Cardiac catheterization      DES to LAD  . Cholecystectomy    . Lymphadenectomy    . Appendectomy    . Tonsilectomy, adenoidectomy, bilateral myringotomy and tubes    . Cystectomy      rt. palm  . C/s tubal ligation    . Stent  implant    . Tonsillectomy    . Coronary angioplasty  2005    1 stent  . Breast lumpectomy      left  . Tubal ligation      with c-section  . Hysteroscopy w/d&c  03/28/2012    Procedure: DILATATION AND CURETTAGE /HYSTEROSCOPY;  Surgeon: Florian Buff, MD;  Location: [facility];  Service: Gynecology;  Laterality: N/A;   Family History  Problem Relation Age of Onset  . Coronary artery disease    . Thrombophlebitis    . Arthritis    . Cancer    . Lung disease    . Asthma    . Diabetes     Social History  Substance Use Topics  . Smoking status: Former Smoker -- 1.50 packs/day for 35 years    Types: Cigarettes    Quit date: 03/22/2004  . Smokeless tobacco: None  . Alcohol Use: No   OB History    No data available     Review of Systems  Constitutional: Positive for fever.  Gastrointestinal: Positive for nausea. Negative for vomiting.  Skin: Positive for color change.  All other systems reviewed and are negative.     Allergies  Codeine; Doxycycline; Duricef; Pentazocine lactate; Percocet; and Vicodin  Home Medications   Prior to Admission medications  Medication Sig Start Date End Date Taking? Authorizing Provider  aspirin EC 81 MG tablet Take 81 mg by mouth daily.    Historical Provider, MD  Blood Glucose Monitoring Suppl (ONE TOUCH ULTRA MINI) W/DEVICE KIT  02/28/14   Historical Provider, MD  Cholecalciferol (VITAMIN D) 2000 UNITS CAPS Take 1 capsule by mouth daily.    Historical Provider, MD  docusate sodium (COLACE) 100 MG capsule Take 100 mg by mouth daily as needed. constipation    Historical Provider, MD  ezetimibe (ZETIA) 10 MG tablet Take 10 mg by mouth at bedtime.     Historical Provider, MD  fish oil-omega-3 fatty acids 1000 MG capsule Take 2 g by mouth 2 (two) times daily.      Historical Provider, MD  gemfibrozil (LOPID) 600 MG tablet Take 600 mg by mouth 2 (two) times daily before a meal.    Historical Provider, MD  loratadine (CLARITIN) 10 MG tablet Take  10 mg by mouth daily as needed for allergies or rhinitis.     Historical Provider, MD  metoprolol tartrate (LOPRESSOR) 25 MG tablet TAKE ONE TABLET BY MOUTH ONCE DAILY 08/27/15   [name], MD  Multiple Vitamin (MULTIVITAMIN) capsule Take 1 capsule by mouth 2 (two) times daily.     Historical Provider, MD  nitroGLYCERIN (NITROSTAT) 0.4 MG SL tablet Place 1 tablet (0.4 mg total) under the tongue every 5 (five) minutes as needed for chest pain. 05/26/15   [name], MD  ONE TOUCH ULTRA TEST test strip  04/11/14   Historical Provider, MD  [name] LANCETS [address] [place]  04/11/14   Historical Provider, MD  rosuvastatin (CRESTOR) 20 MG tablet Take 20 mg by mouth at bedtime.     Historical Provider, MD  sulfamethoxazole-trimethoprim (BACTRIM DS,SEPTRA DS) 800-160 MG tablet Take 1 tablet by mouth 2 (two) times daily. 10/25/15 11/01/15  Nat Christen, MD  vitamin B-12 (CYANOCOBALAMIN) 500 MCG tablet Take 500 mcg by mouth daily.      Historical Provider, MD   BP 169/85 mmHg  Pulse 111  Temp(Src) 98.3 F (36.8 C) (Oral)  Resp 20  Ht 5' 4"  (1.626 m)  Wt 250 lb (113.399 kg)  BMI 42.89 kg/m2  SpO2 95% Physical Exam  Constitutional: She is oriented to person, place, and time. She appears well-developed and well-nourished. No distress.  HENT:  Head: Normocephalic and atraumatic.  Right Ear: External ear normal.  Left Ear: External ear normal.  Nose: Nose normal.  Eyes: Right eye exhibits no discharge. Left eye exhibits no discharge.  Cardiovascular: Tachycardia present.   Pulmonary/Chest: Effort normal and breath sounds normal.    Abdominal: Soft. There is no tenderness.  Neurological: She is alert and oriented to person, place, and time.  Skin: Skin is warm and dry. She is not diaphoretic. There is erythema.  Nursing note and vitals reviewed.   ED Course  Procedures (including critical care time) Labs Review Labs Reviewed  BASIC METABOLIC PANEL - Abnormal; Notable for the following:     Sodium 134 (*)    Potassium 3.3 (*)    Glucose, Bld 173 (*)    All other components within normal limits  CBC WITH DIFFERENTIAL/PLATELET - Abnormal; Notable for the following:    WBC 12.7 (*)    Neutro Abs 11.2 (*)    All other components within normal limits  LACTIC ACID, PLASMA    Imaging Review No results found. I have personally reviewed and evaluated these images and lab results as  part of my medical decision-making.   EKG Interpretation None      MDM   Final diagnoses:  Cellulitis of left breast    Patient is well appearing here. Her whole breast is erythematous but pain is controlled and she wants to go home. Lactate normal, mild hyperglycemia without acidosis and mildly elevated WBC. Likely she is so early on that the cellulitis is still spreading but it is only minimally past the lines drawn. After discussion with patient she will go home with continued antibiotics and f/u with PCP in 1-2 days for re-check. Return if symptoms worsen.    Sherwood Gambler, MD 10/25/15 [phone]

## 2015-10-25 NOTE — Discharge Instructions (Signed)
Keep breast very clean. Antibiotic twice a day. Return if worse.

## 2015-10-25 NOTE — ED Notes (Signed)
Pt states she woke up with redness, pain, and swelling of left breast.  States this is a recurring problem due to breast cancer on that side 10 years ago.  Also c/o nausea and diarrhea.

## 2015-10-25 NOTE — ED Notes (Signed)
Received report on pt, pt c/o headache, states that she did tell the edp and would take tylenol when she got home, comfort measures provided,

## 2015-10-25 NOTE — ED Notes (Signed)
Pt walked to the bathroom with minimal assistance.

## 2015-10-25 NOTE — ED Notes (Signed)
Pt returned to ED after being evaluated and tx for cellulitis of her left breast earlier this morning. Pt given PO bactrim while in ED. Cellulitis marked in skin marker and redness now expands past markings. Pt reports fevers of 100.54f orally

## 2016-03-09 LAB — TSH: TSH: 1.09 (ref 0.41–5.90)

## 2016-03-14 ENCOUNTER — Other Ambulatory Visit: Payer: Self-pay | Admitting: Pulmonary Disease

## 2016-03-14 DIAGNOSIS — Z1231 Encounter for screening mammogram for malignant neoplasm of breast: Secondary | ICD-10-CM

## 2016-04-28 ENCOUNTER — Ambulatory Visit
Admission: RE | Admit: 2016-04-28 | Discharge: 2016-04-28 | Disposition: A | Discharge status: Home / Self Care | Payer: Managed Care, Other (non HMO) | Source: Ambulatory Visit | Attending: Pulmonary Disease | Admitting: Pulmonary Disease

## 2016-04-28 DIAGNOSIS — Z1231 Encounter for screening mammogram for malignant neoplasm of breast: Secondary | ICD-10-CM

## 2016-05-26 ENCOUNTER — Ambulatory Visit: Payer: Managed Care, Other (non HMO) | Admitting: Cardiovascular Disease

## 2016-07-25 NOTE — Progress Notes (Signed)
Patient ID: [name] [name], female   DOB: [birth year], 61 y.o.   MRN: [id number]   [name] [name] returns today for follow-up. 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 She has had follow-up breast cancer treatment. She had a lumpectomy and adjuvant therapy including chemo. She has a mamogram scheduled next month and this year marks her 10 year survival She has had previous cardiomegaly. She is a previous smoker with hyperlipidemia and coronary disease. She had a stent to the LAD in May 2006. It was a drug-eluting stent. [name] KitchenHe anginal equivalent in past has been neck shoulder and back pain. She has had separate left shoulder issues and seen ortho with PT/OT. Had had more chest/shoulder pain radiating to neck. Seen in [facility] ER 3/13 with GI illness. CXR ok. Pain in chest and shoulder at rest not exertion. Has not taken nitro F/U stress test was normal   Myovue 8/13 normal EF 67%     Reviewed labs from Dr Luan Pulling 4/13  LDL 64 and normal LFT;s  Recovered from left humeral fracture 2014 Fractured left foot 2015   BP is up some LE edema. Diet still not real good. She can check her BP at drug store up street   ROS: Denies fever, malais, weight loss, blurry vision, decreased visual acuity, cough, sputum, SOB, hemoptysis, pleuritic pain, palpitaitons, heartburn, abdominal pain, melena, lower extremity edema, claudication, or rash.  All other systems reviewed and negative   General: Filed Weights   07/29/16 0910  Weight: 266 lb 6.4 oz (120.8 kg)   Affect appropriate Obese white femlae  HEENT: normal Neck supple with no adenopathy JVP normal no bruits no thyromegaly Lungs clear with no wheezing and good diaphragmatic motion Heart:  S1/S2 no murmur,rub, gallop or click PMI normal Abdomen: benighn, BS positve, no tenderness, no AAA no bruit.  No HSM or HJR Distal pulses intact with no bruits Plus one bilateral edema Neuro non-focal Skin warm and dry Boot on left fractured foot   Medications Current  Outpatient Prescriptions  Medication Sig Dispense Refill  . Acetaminophen (TYLENOL PO) Take 650-1,000 mg by mouth daily as needed (pain).    [name] [name] aspirin EC 81 MG tablet Take 81 mg by mouth daily.    . Cholecalciferol (VITAMIN D) 2000 UNITS CAPS Take 1 capsule by mouth daily.    [name] [name] docusate sodium (COLACE) 100 MG capsule Take 100 mg by mouth daily. constipation    . ezetimibe (ZETIA) 10 MG tablet Take 10 mg by mouth at bedtime.     . fish oil-omega-3 fatty acids 1000 MG capsule Take 1 g by mouth 2 (two) times daily.     [name] [name] gemfibrozil (LOPID) 600 MG tablet Take 600 mg by mouth 2 (two) times daily before a meal.    . metoprolol tartrate (LOPRESSOR) 25 MG tablet TAKE ONE TABLET BY MOUTH ONCE DAILY 90 tablet 3  . Multiple Vitamin (MULTIVITAMIN WITH MINERALS) TABS tablet Take 0.5 tablets by mouth 2 (two) times daily.    . nitroGLYCERIN (NITROSTAT) 0.4 MG SL tablet Place 1 tablet (0.4 mg total) under the tongue every 5 (five) minutes as needed for chest pain. 25 tablet 3  . piroxicam (FELDENE) 20 MG capsule Take 20 mg by mouth daily as needed (swelling).    . rosuvastatin (CRESTOR) 20 MG tablet Take 20 mg by mouth at bedtime.     . vitamin B-12 (CYANOCOBALAMIN) 500 MCG tablet Take 500 mcg by mouth daily.       No current facility-administered medications for  this visit.     Allergies Vicodin [hydrocodone-acetaminophen]; Codeine; Doxycycline; Pentazocine lactate; Percocet [oxycodone-acetaminophen]; and Duricef [cefadroxil monohydrate]  Family History: Family History  Problem Relation Age of Onset  . Coronary artery disease    . Thrombophlebitis    . Arthritis    . Cancer    . Lung disease    . Asthma    . Diabetes      Social History: Social History   Social History  . Marital status: Divorced    Spouse name: N/A  . Number of children: N/A  . Years of education: N/A   Occupational History  . Not on file.   Social History Main Topics  . Smoking status: Former Smoker    Packs/day:  1.50    Years: 35.00    Types: Cigarettes    Quit date: 03/22/2004  . Smokeless tobacco: Never Used  . Alcohol use No  . Drug use: No  . Sexual activity: Yes    Birth control/ protection: Surgical   Other Topics Concern  . Not on file   Social History Narrative  . No narrative on file    Past Surgical History:  Procedure Laterality Date  . APPENDECTOMY    . BREAST LUMPECTOMY     left  . C/S tubal ligation    . CARDIAC CATHETERIZATION     DES to LAD  . CHOLECYSTECTOMY    . colonoscopy with cold snare and snare cautery polypectomy    . CORONARY ANGIOPLASTY  2005   1 stent  . CYSTECTOMY     rt. palm  . HYSTEROSCOPY W/D&C  03/28/2012   Procedure: DILATATION AND CURETTAGE /HYSTEROSCOPY;  Surgeon: Florian Buff, MD;  Location: [facility];  Service: Gynecology;  Laterality: N/A;  . LYMPHADENECTOMY    . stent implant    . TONSILECTOMY, ADENOIDECTOMY, BILATERAL MYRINGOTOMY AND TUBES    . TONSILLECTOMY    . TUBAL LIGATION     with c-section    Past Medical History:  Diagnosis Date  . Anserine bursitis   . Breast cancer ([place])    left lumpectomy  . CAD (coronary artery disease) 2006   DES to LAD  . Cardiomegaly   . Chest pain   . Complication of anesthesia   . Diabetes mellitus without complication ([place])   . Edema    legs  . GERD (gastroesophageal reflux disease)   . HTN (hypertension)   . Hyperlipidemia   . Kidney stones   . Knee pain   . MI (myocardial infarction) 2005  . PONV (postoperative nausea and vomiting)   . Pulmonary embolism ([place])     Electrocardiogram:   2015 SR LVH  2014 today NSR rate 85 no LVH  SR rate 74  LVH no changes  07/29/16  SR rate 74 LVH   Assessment and Plan CAD:  Stent LAD 2006 Last myvovue reviewed and normal in 2013   Chol: labs with primary on statin  Cardiomegaly: normal EF no volume overload f/u echo to make sure EF still normal  HTN: elevated with some edema start HCTZ 12.5 mg BMET 4 weeks f/u with me next available   Breast  Cancer yearly mammogram 10 years survival post Rx    Jenkins Rouge

## 2016-07-29 ENCOUNTER — Ambulatory Visit (INDEPENDENT_AMBULATORY_CARE_PROVIDER_SITE_OTHER): Payer: Managed Care, Other (non HMO) | Admitting: Cardiovascular Disease

## 2016-07-29 ENCOUNTER — Encounter: Payer: Self-pay | Admitting: Cardiovascular Disease

## 2016-07-29 VITALS — BP 150/100 | HR 82 | Ht 63.0 in | Wt 266.4 lb

## 2016-07-29 DIAGNOSIS — Z8679 Personal history of other diseases of the circulatory system: Secondary | ICD-10-CM | POA: Diagnosis not present

## 2016-07-29 DIAGNOSIS — Z79899 Other long term (current) drug therapy: Secondary | ICD-10-CM | POA: Diagnosis not present

## 2016-07-29 DIAGNOSIS — I2510 Atherosclerotic heart disease of native coronary artery without angina pectoris: Secondary | ICD-10-CM | POA: Diagnosis not present

## 2016-07-29 MED ORDER — HYDROCHLOROTHIAZIDE 12.5 MG PO TABS: 12.5000 mg | ORAL_TABLET | Freq: Every day | ORAL | 3 refills | Status: DC

## 2016-07-29 NOTE — Patient Instructions (Addendum)
Medication Instructions:  Your physician has recommended you make the following change in your medication:  1-Hydrochlorothiazide 12.5 mg by mouth daily  Labwork: Your physician recommends that you return for lab work in: 4 weeks - BMET  Testing/Procedures: NONE  Follow-Up: Your physician wants you to follow-up next available with Dr. Johnsie Cancel.    If you need a refill on your cardiac medications before your next appointment, please call your pharmacy.

## 2016-08-25 NOTE — Progress Notes (Signed)
Patient ID: [name] [name], female   DOB: 1956-03-02, 61 y.o.   MRN: [id number]   Karagan returns today for follow-up. 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 She has had follow-up breast cancer treatment. She had a lumpectomy and adjuvant therapy including chemo. She has a mamogram scheduled next month and this year marks her 10 year survival She has had previous cardiomegaly. She is a previous smoker with hyperlipidemia and coronary disease. She had a stent to the LAD in May 2006. It was a drug-eluting stent. Marland KitchenHe anginal equivalent in past has been neck shoulder and back pain. She has had separate left shoulder issues and seen ortho with PT/OT. Had had more chest/shoulder pain radiating to neck. Seen in [facility] 3/13 with GI illness. CXR ok. Pain in chest and shoulder at rest not exertion. Has not taken nitro F/U stress test was normal   Myovue 8/13 normal EF 67%     Reviewed labs from Dr Luan Pulling 4/13  LDL 64 and normal LFT;s  Recovered from left humeral fracture 2014 Fractured left foot 2015   BP is up some LE edema. Diet still not real good. She can check her BP at drug store up street   ROS: Denies fever, malais, weight loss, blurry vision, decreased visual acuity, cough, sputum, SOB, hemoptysis, pleuritic pain, palpitaitons, heartburn, abdominal pain, melena, lower extremity edema, claudication, or rash.  All other systems reviewed and negative   General: Filed Weights   08/26/16 0932  Weight: 259 lb 12.8 oz (117.8 kg)   Affect appropriate Obese white femlae  HEENT: normal Neck supple with no adenopathy JVP normal no bruits no thyromegaly Lungs clear with no wheezing and good diaphragmatic motion Heart:  S1/S2 no murmur,rub, gallop or click PMI normal Abdomen: benighn, BS positve, no tenderness, no AAA no bruit.  No HSM or HJR Distal pulses intact with no bruits Plus one bilateral edema Neuro non-focal Skin warm and dry Boot on left fractured foot   Medications Current  Outpatient Prescriptions  Medication Sig Dispense Refill  . Acetaminophen (TYLENOL PO) Take 650-1,000 mg by mouth daily as needed (pain).    Marland Kitchen aspirin EC 81 MG tablet Take 81 mg by mouth daily.    . Cholecalciferol (VITAMIN D) 2000 UNITS CAPS Take 1 capsule by mouth daily.    Marland Kitchen docusate sodium (COLACE) 100 MG capsule Take 100 mg by mouth daily. constipation    . ezetimibe (ZETIA) 10 MG tablet Take 10 mg by mouth at bedtime.     . fish oil-omega-3 fatty acids 1000 MG capsule Take 1 g by mouth 2 (two) times daily.     Marland Kitchen gemfibrozil (LOPID) 600 MG tablet Take 600 mg by mouth 2 (two) times daily before a meal.    . hydrochlorothiazide (HYDRODIURIL) 12.5 MG tablet Take 1 tablet (12.5 mg total) by mouth daily. 90 tablet 3  . metoprolol tartrate (LOPRESSOR) 25 MG tablet TAKE ONE TABLET BY MOUTH ONCE DAILY 90 tablet 3  . Multiple Vitamin (MULTIVITAMIN WITH MINERALS) TABS tablet Take 0.5 tablets by mouth 2 (two) times daily.    . nitroGLYCERIN (NITROSTAT) 0.4 MG SL tablet Place 1 tablet (0.4 mg total) under the tongue every 5 (five) minutes as needed for chest pain. 25 tablet 3  . piroxicam (FELDENE) 20 MG capsule Take 20 mg by mouth daily as needed (swelling).    . rosuvastatin (CRESTOR) 20 MG tablet Take 20 mg by mouth at bedtime.     . vitamin B-12 (CYANOCOBALAMIN) 500  MCG tablet Take 500 mcg by mouth daily.       No current facility-administered medications for this visit.     Allergies Vicodin [hydrocodone-acetaminophen]; Codeine; Doxycycline; Pentazocine lactate; Percocet [oxycodone-acetaminophen]; and Duricef [cefadroxil monohydrate]  Family History: Family History  Problem Relation Age of Onset  . Coronary artery disease    . Thrombophlebitis    . Arthritis    . Cancer    . Lung disease    . Asthma    . Diabetes      Social History: Social History   Social History  . Marital status: Divorced    Spouse name: N/A  . Number of children: N/A  . Years of education: N/A    Occupational History  . Not on file.   Social History Main Topics  . Smoking status: Former Smoker    Packs/day: 1.50    Years: 35.00    Types: Cigarettes    Quit date: 03/22/2004  . Smokeless tobacco: Never Used  . Alcohol use No  . Drug use: No  . Sexual activity: Yes    Birth control/ protection: Surgical   Other Topics Concern  . Not on file   Social History Narrative  . No narrative on file    Past Surgical History:  Procedure Laterality Date  . APPENDECTOMY    . BREAST LUMPECTOMY     left  . C/S tubal ligation    . CARDIAC CATHETERIZATION     DES to LAD  . CHOLECYSTECTOMY    . colonoscopy with cold snare and snare cautery polypectomy    . CORONARY ANGIOPLASTY  2005   1 stent  . CYSTECTOMY     rt. palm  . HYSTEROSCOPY W/D&C  03/28/2012   Procedure: DILATATION AND CURETTAGE /HYSTEROSCOPY;  Surgeon: Florian Buff, MD;  Location: [facility];  Service: Gynecology;  Laterality: N/A;  . LYMPHADENECTOMY    . stent implant    . TONSILECTOMY, ADENOIDECTOMY, BILATERAL MYRINGOTOMY AND TUBES    . TONSILLECTOMY    . TUBAL LIGATION     with c-section    Past Medical History:  Diagnosis Date  . Anserine bursitis   . Breast cancer ([place])    left lumpectomy  . CAD (coronary artery disease) 2006   DES to LAD  . Cardiomegaly   . Chest pain   . Complication of anesthesia   . Diabetes mellitus without complication ([place])   . Edema    legs  . GERD (gastroesophageal reflux disease)   . HTN (hypertension)   . Hyperlipidemia   . Kidney stones   . Knee pain   . MI (myocardial infarction) ([place]) 2005  . PONV (postoperative nausea and vomiting)   . Pulmonary embolism ([place])     Electrocardiogram:   2015 SR LVH  2014 today NSR rate 85 no LVH  SR rate 74  LVH no changes  07/29/16  SR rate 74 LVH   Assessment and Plan CAD:  Stent LAD 2006 Last myvovue reviewed and normal in 2013   Chol: labs with primary on statin and zetia  Cardiomegaly: previously normal EF no volume  overload  f/u echo  HTN:  Improved with HCTZ  Breast Cancer yearly mammogram 10 years survival post Rx    Jenkins Rouge

## 2016-08-26 ENCOUNTER — Ambulatory Visit (INDEPENDENT_AMBULATORY_CARE_PROVIDER_SITE_OTHER): Payer: Managed Care, Other (non HMO) | Admitting: Cardiovascular Disease

## 2016-08-26 ENCOUNTER — Encounter: Payer: Self-pay | Admitting: Cardiovascular Disease

## 2016-08-26 ENCOUNTER — Other Ambulatory Visit: Payer: Managed Care, Other (non HMO)

## 2016-08-26 VITALS — BP 140/80 | HR 80 | Ht 63.0 in | Wt 259.8 lb

## 2016-08-26 DIAGNOSIS — I2510 Atherosclerotic heart disease of native coronary artery without angina pectoris: Secondary | ICD-10-CM

## 2016-08-26 DIAGNOSIS — Z8679 Personal history of other diseases of the circulatory system: Secondary | ICD-10-CM

## 2016-08-26 DIAGNOSIS — Z79899 Other long term (current) drug therapy: Secondary | ICD-10-CM

## 2016-08-26 LAB — BASIC METABOLIC PANEL
BUN / CREAT RATIO: 32 — AB (ref 12–28)
BUN: 16 mg/dL (ref 8–27)
CO2: 27 mmol/L (ref 18–29)
Calcium: 9.7 mg/dL (ref 8.7–10.3)
Chloride: 101 mmol/L (ref 96–106)
Creatinine, Ser: 0.5 mg/dL — ABNORMAL LOW (ref 0.57–1.00)
GFR calc non Af Amer: 103 mL/min/{1.73_m2} (ref >59)
GFR, EST AFRICAN AMERICAN: 119 mL/min/{1.73_m2} (ref >59)
Glucose: 131 mg/dL — ABNORMAL HIGH (ref 65–99)
Potassium: 4.2 mmol/L (ref 3.5–5.2)
Sodium: 143 mmol/L (ref 134–144)

## 2016-08-26 NOTE — Patient Instructions (Signed)
Continue same medications.   Your physician wants you to follow-up in: 1 year.  You will receive a reminder letter in the mail two months in advance. If you don't receive a letter, please call our office to schedule the follow-up appointment.  

## 2016-08-30 ENCOUNTER — Telehealth: Payer: Self-pay | Admitting: Cardiovascular Disease

## 2016-08-30 NOTE — Telephone Encounter (Signed)
See result note.  

## 2016-08-30 NOTE — Telephone Encounter (Signed)
New Message  Pt voiced wanting nurse to give her a call back about results due to [facility] I not here today.  Please f/u

## 2016-11-14 ENCOUNTER — Other Ambulatory Visit: Payer: Self-pay | Admitting: Cardiovascular Disease

## 2016-12-16 ENCOUNTER — Other Ambulatory Visit (HOSPITAL_COMMUNITY): Payer: Self-pay | Admitting: Pulmonary Disease

## 2016-12-16 DIAGNOSIS — Z780 Asymptomatic menopausal state: Secondary | ICD-10-CM

## 2017-01-12 ENCOUNTER — Ambulatory Visit (HOSPITAL_COMMUNITY)
Admission: RE | Admit: 2017-01-12 | Discharge: 2017-01-12 | Disposition: A | Discharge status: Home / Self Care | Payer: Managed Care, Other (non HMO) | Source: Ambulatory Visit | Attending: Pulmonary Disease | Admitting: Pulmonary Disease

## 2017-01-12 DIAGNOSIS — M85831 Other specified disorders of bone density and structure, right forearm: Secondary | ICD-10-CM | POA: Diagnosis not present

## 2017-01-12 DIAGNOSIS — M85852 Other specified disorders of bone density and structure, left thigh: Secondary | ICD-10-CM | POA: Diagnosis not present

## 2017-01-12 DIAGNOSIS — Z780 Asymptomatic menopausal state: Secondary | ICD-10-CM | POA: Insufficient documentation

## 2017-01-12 LAB — HM DEXA SCAN

## 2017-05-16 ENCOUNTER — Other Ambulatory Visit: Payer: Self-pay | Admitting: Pulmonary Disease

## 2017-05-16 DIAGNOSIS — Z139 Encounter for screening, unspecified: Secondary | ICD-10-CM

## 2017-06-06 ENCOUNTER — Ambulatory Visit: Payer: Managed Care, Other (non HMO)

## 2017-08-11 ENCOUNTER — Ambulatory Visit
Admission: RE | Admit: 2017-08-11 | Discharge: 2017-08-11 | Disposition: A | Discharge status: Home / Self Care | Payer: Managed Care, Other (non HMO) | Source: Ambulatory Visit | Attending: Pulmonary Disease | Admitting: Pulmonary Disease

## 2017-08-11 DIAGNOSIS — Z139 Encounter for screening, unspecified: Secondary | ICD-10-CM

## 2017-08-14 LAB — HM MAMMOGRAPHY

## 2017-08-25 ENCOUNTER — Ambulatory Visit: Payer: Managed Care, Other (non HMO) | Admitting: Cardiovascular Disease

## 2017-09-06 LAB — COMPREHENSIVE METABOLIC PANEL
GFR calc Af Amer: 114
GFR calc non Af Amer: 99

## 2017-09-06 LAB — BASIC METABOLIC PANEL
BUN: 13 (ref 4–21)
CO2: 28 — AB (ref 13–22)
Chloride: 100 (ref 99–108)
Creatinine: 0.6 (ref <=1.1)
Glucose: 224
Potassium: 4 (ref 3.4–5.3)
Sodium: 138 (ref 137–147)

## 2017-09-06 LAB — LIPID PANEL
Cholesterol: 173 (ref 0–200)
HDL: 45 (ref 35–70)
LDL Cholesterol: 100
Triglycerides: 182 — AB (ref 40–160)

## 2017-10-05 ENCOUNTER — Other Ambulatory Visit: Payer: Self-pay | Admitting: Cardiovascular Disease

## 2017-10-10 NOTE — Progress Notes (Signed)
Patient ID: [name] [name], female   DOB: Sep 18, 1955, 62 y.o.   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 Previous breast cancer with lumpectomy and adjuvant chemo. Previous smoker with HLD and CAD with stent to LAD in Aug 06, 2004  History of cardiomegaly. Anginal pain was atypical with shoulder and back pain. Last normal stress test myovue August 2013  Myovue 8/13 normal EF 67%    Recovered from left humeral fracture 08-06-2012 Fractured left foot 2013-08-06   Both parents died in 04-09-2023 after falls they were 42/64 yo She has been stressed and BP up a bit  Going to [employer] [place] through time with son to have a vacation once POA issues resolved   ROS: Denies fever, malais, weight loss, blurry vision, decreased visual acuity, cough, sputum, SOB, hemoptysis, pleuritic pain, palpitaitons, heartburn, abdominal pain, melena, lower extremity edema, claudication, or rash.  All other systems reviewed and negative   General: Filed Weights   10/11/17 0756  Weight: 258 lb 12 oz (117.4 kg)   Affect appropriate Obese white femlae  HEENT: normal Neck supple with no adenopathy JVP normal no bruits no thyromegaly Lungs clear with no wheezing and good diaphragmatic motion Heart:  S1/S2 no murmur,rub, gallop or click PMI normal Abdomen: benighn, BS positve, no tenderness, no AAA no bruit.  No HSM or HJR Distal pulses intact with no bruits Plus one bilateral edema Neuro non-focal Skin warm and dry Boot on left fractured foot   Medications Current Outpatient Medications  Medication Sig Dispense Refill  . Acetaminophen (TYLENOL PO) Take 650-1,000 mg by mouth daily as needed (pain).    Marland Kitchen aspirin EC 81 MG tablet Take 81 mg by mouth daily.    . Cholecalciferol (VITAMIN D) 2000 UNITS CAPS Take 1 capsule by mouth daily.    Marland Kitchen docusate sodium (COLACE) 100 MG capsule Take 100 mg by mouth daily. constipation    . ezetimibe (ZETIA) 10 MG tablet Take 10 mg by mouth at  bedtime.     . fish oil-omega-3 fatty acids 1000 MG capsule Take 1 g by mouth 2 (two) times daily.     Marland Kitchen gemfibrozil (LOPID) 600 MG tablet Take 600 mg by mouth 2 (two) times daily before a meal.    . hydrochlorothiazide (HYDRODIURIL) 12.5 MG tablet Take 1 tablet (12.5 mg total) by mouth daily. 90 tablet 3  . Multiple Vitamin (MULTIVITAMIN WITH MINERALS) TABS tablet Take 0.5 tablets by mouth 2 (two) times daily.    . nitroGLYCERIN (NITROSTAT) 0.4 MG SL tablet Place 1 tablet (0.4 mg total) under the tongue every 5 (five) minutes as needed for chest pain. 25 tablet 3  . piroxicam (FELDENE) 20 MG capsule Take 20 mg by mouth daily as needed (swelling).    . rosuvastatin (CRESTOR) 20 MG tablet Take 20 mg by mouth at bedtime.     . vitamin B-12 (CYANOCOBALAMIN) 500 MCG tablet Take 500 mcg by mouth daily.      . metoprolol succinate (TOPROL-XL) 50 MG 24 hr tablet Take 1 tablet (50 mg total) by mouth daily. Take with or immediately following a meal. 90 tablet 3   No current facility-administered medications for this visit.     Allergies Vicodin [hydrocodone-acetaminophen]; Codeine; Doxycycline; Pentazocine lactate; Percocet [oxycodone-acetaminophen]; and Duricef [cefadroxil monohydrate]  Family History: Family History  Problem Relation Age of Onset  . Coronary artery disease Unknown   . Thrombophlebitis Unknown   . Arthritis Unknown   . Cancer  Unknown   . Lung disease Unknown   . Asthma Unknown   . Diabetes Unknown     Social History: Social History   Socioeconomic History  . Marital status: Divorced    Spouse name: Not on file  . Number of children: Not on file  . Years of education: Not on file  . Highest education level: Not on file  Occupational History  . Not on file  Social Needs  . Financial resource strain: Not on file  . Food insecurity:    Worry: Not on file    Inability: Not on file  . Transportation needs:    Medical: Not on file    Non-medical: Not on file  Tobacco  Use  . Smoking status: Former Smoker    Packs/day: 1.50    Years: 35.00    Pack years: 52.50    Types: Cigarettes    Last attempt to quit: 03/22/2004    Years since quitting: 13.5  . Smokeless tobacco: Never Used  Substance and Sexual Activity  . Alcohol use: No  . Drug use: No  . Sexual activity: Yes    Birth control/protection: Surgical  Lifestyle  . Physical activity:    Days per week: Not on file    Minutes per session: Not on file  . Stress: Not on file  Relationships  . Social connections:    Talks on phone: Not on file    Gets together: Not on file    Attends religious service: Not on file    Active member of club or organization: Not on file    Attends meetings of clubs or organizations: Not on file    Relationship status: Not on file  . Intimate partner violence:    Fear of current or ex partner: Not on file    Emotionally abused: Not on file    Physically abused: Not on file    Forced sexual activity: Not on file  Other Topics Concern  . Not on file  Social History Narrative  . Not on file    Past Surgical History:  Procedure Laterality Date  . APPENDECTOMY    . BREAST LUMPECTOMY     left  . C/S tubal ligation    . CARDIAC CATHETERIZATION     DES to LAD  . CHOLECYSTECTOMY    . colonoscopy with cold snare and snare cautery polypectomy    . CORONARY ANGIOPLASTY  2005   1 stent  . CYSTECTOMY     rt. palm  . HYSTEROSCOPY W/D&C  03/28/2012   Procedure: DILATATION AND CURETTAGE /HYSTEROSCOPY;  Surgeon: Florian Buff, MD;  Location: [facility];  Service: Gynecology;  Laterality: N/A;  . LYMPHADENECTOMY    . stent implant    . TONSILECTOMY, ADENOIDECTOMY, BILATERAL MYRINGOTOMY AND TUBES    . TONSILLECTOMY    . TUBAL LIGATION     with c-section    Past Medical History:  Diagnosis Date  . Anserine bursitis   . Breast cancer ([place])    left lumpectomy  . CAD (coronary artery disease) 2006   DES to LAD  . Cardiomegaly   . Chest pain   . Complication of  anesthesia   . Diabetes mellitus without complication ([place])   . Edema    legs  . GERD (gastroesophageal reflux disease)   . HTN (hypertension)   . Hyperlipidemia   . Kidney stones   . Knee pain   . MI (myocardial infarction) ([place]) 2005  . PONV (postoperative  nausea and vomiting)   . Pulmonary embolism ([facility])     Electrocardiogram:   10/11/17 SR rate 74 normal   Assessment and Plan CAD:  Stent LAD 2006 Last myvovue reviewed and normal in 2013   Chol: labs with primary on statin and zetia  Cardiomegaly: previously normal EF no volume overload  f/u echo ordered  HTN:  Up due to stress of parents death. Changed beta blocker to appropriate succinate formulation for once / day f/u 3 months continue diuretic  Breast Cancer yearly mammogram 10 years survival post Rx    Jenkins Rouge

## 2017-10-11 ENCOUNTER — Ambulatory Visit (INDEPENDENT_AMBULATORY_CARE_PROVIDER_SITE_OTHER): Payer: Managed Care, Other (non HMO) | Admitting: Cardiovascular Disease

## 2017-10-11 ENCOUNTER — Encounter: Payer: Self-pay | Admitting: Cardiovascular Disease

## 2017-10-11 VITALS — BP 160/88 | HR 96 | Ht 63.0 in | Wt 258.8 lb

## 2017-10-11 DIAGNOSIS — I517 Cardiomegaly: Secondary | ICD-10-CM | POA: Diagnosis not present

## 2017-10-11 DIAGNOSIS — I2510 Atherosclerotic heart disease of native coronary artery without angina pectoris: Secondary | ICD-10-CM | POA: Diagnosis not present

## 2017-10-11 DIAGNOSIS — I10 Essential (primary) hypertension: Secondary | ICD-10-CM | POA: Diagnosis not present

## 2017-10-11 DIAGNOSIS — E785 Hyperlipidemia, unspecified: Secondary | ICD-10-CM | POA: Diagnosis not present

## 2017-10-11 MED ORDER — METOPROLOL SUCCINATE ER 50 MG PO TB24: 50.0000 mg | ORAL_TABLET | Freq: Every day | ORAL | 3 refills | Status: DC

## 2017-10-11 NOTE — Patient Instructions (Addendum)
Medication Instructions:  Your physician has recommended you make the following change in your medication:  1- START Metoprolol 50 mg by mouth daily   Labwork: NONE  Testing/Procedures: NONE  Follow-Up: Your physician wants you to follow-up in: 3 months with Dr. Johnsie Cancel.  If you need a refill on your cardiac medications before your next appointment, please call your pharmacy.

## 2018-01-16 ENCOUNTER — Other Ambulatory Visit: Payer: Self-pay | Admitting: Cardiovascular Disease

## 2018-01-18 ENCOUNTER — Encounter: Payer: Self-pay | Admitting: Cardiovascular Disease

## 2018-01-22 NOTE — Progress Notes (Signed)
Patient ID: [name] [name], female   DOB: [birth year], 62 y.o.   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 Previous breast cancer with lumpectomy and adjuvant chemo. Previous smoker with HLD and CAD with stent to LAD in 08-24-04  History of cardiomegaly. Anginal pain was atypical with shoulder and back pain. Last normal stress test myovue August 2013  Myovue 8/13 normal EF 67%    Recovered from left humeral fracture 08-24-2012 Fractured left foot August 24, 2013   Both parents died in 2017/04/26  after falls they were 85/64 yo She has been stressed and BP up a bit and beta blocker and diuretic dose adjusted in June  Doing well will likely retire next June    ROS: Denies fever, malais, weight loss, blurry vision, decreased visual acuity, cough, sputum, SOB, hemoptysis, pleuritic pain, palpitaitons, heartburn, abdominal pain, melena, lower extremity edema, claudication, or rash.  All other systems reviewed and negative   General: Filed Weights   01/31/18 0834  Weight: 259 lb 3.2 oz (117.6 kg)  BP (!) 156/86   Pulse 88   Ht 5\' 3"  (1.6 m)   Wt 259 lb 3.2 oz (117.6 kg)   SpO2 95%   BMI 45.92 kg/m   Repeat BP after sitting 135/85 and home readings are normal  Affect appropriate Overweight white female  HEENT: normal Neck supple with no adenopathy JVP normal no bruits no thyromegaly Lungs clear with no wheezing and good diaphragmatic motion Heart:  S1/S2 no murmur, no rub, gallop or click PMI normal Abdomen: benighn, BS positve, no tenderness, no AAA no bruit.  No HSM or HJR Distal pulses intact with no bruits No edema Neuro non-focal Skin warm and dry No muscular weakness   Medications Current Outpatient Medications  Medication Sig Dispense Refill  . Acetaminophen (TYLENOL PO) Take 650-1,000 mg by mouth daily as needed (pain).    Marland Kitchen aspirin EC 81 MG tablet Take 81 mg by mouth daily.    . Cholecalciferol (VITAMIN D) 2000 UNITS CAPS Take 1 capsule by  mouth daily.    Marland Kitchen docusate sodium (COLACE) 100 MG capsule Take 100 mg by mouth daily. constipation    . ezetimibe (ZETIA) 10 MG tablet Take 10 mg by mouth at bedtime.     . fish oil-omega-3 fatty acids 1000 MG capsule Take 1 g by mouth 2 (two) times daily.     Marland Kitchen gemfibrozil (LOPID) 600 MG tablet Take 600 mg by mouth 2 (two) times daily before a meal.    . hydrochlorothiazide (HYDRODIURIL) 12.5 MG tablet TAKE 1 TABLET BY MOUTH ONCE DAILY 90 tablet 2  . metoprolol succinate (TOPROL-XL) 50 MG 24 hr tablet Take 1 tablet (50 mg total) by mouth daily. Take with or immediately following a meal. 90 tablet 3  . Multiple Vitamin (MULTIVITAMIN WITH MINERALS) TABS tablet Take 0.5 tablets by mouth 2 (two) times daily.    . nitroGLYCERIN (NITROSTAT) 0.4 MG SL tablet Place 1 tablet (0.4 mg total) under the tongue every 5 (five) minutes as needed for chest pain. 25 tablet 3  . piroxicam (FELDENE) 20 MG capsule Take 20 mg by mouth daily as needed (swelling).    . rosuvastatin (CRESTOR) 20 MG tablet Take 20 mg by mouth at bedtime.     . vitamin B-12 (CYANOCOBALAMIN) 500 MCG tablet Take 500 mcg by mouth daily.       No current facility-administered medications for this visit.     Allergies Vicodin [  hydrocodone-acetaminophen]; Codeine; Doxycycline; Pentazocine lactate; Percocet [oxycodone-acetaminophen]; and Duricef [cefadroxil monohydrate]  Family History: Family History  Problem Relation Age of Onset  . Coronary artery disease Unknown   . Thrombophlebitis Unknown   . Arthritis Unknown   . Cancer Unknown   . Lung disease Unknown   . Asthma Unknown   . Diabetes Unknown     Social History: Social History   Socioeconomic History  . Marital status: Divorced    Spouse name: Not on file  . Number of children: Not on file  . Years of education: Not on file  . Highest education level: Not on file  Occupational History  . Not on file  Social Needs  . Financial resource strain: Not on file  . Food  insecurity:    Worry: Not on file    Inability: Not on file  . Transportation needs:    Medical: Not on file    Non-medical: Not on file  Tobacco Use  . Smoking status: Former Smoker    Packs/day: 1.50    Years: 35.00    Pack years: 52.50    Types: Cigarettes    Last attempt to quit: 03/22/2004    Years since quitting: 13.8  . Smokeless tobacco: Never Used  Substance and Sexual Activity  . Alcohol use: No  . Drug use: No  . Sexual activity: Yes    Birth control/protection: Surgical  Lifestyle  . Physical activity:    Days per week: Not on file    Minutes per session: Not on file  . Stress: Not on file  Relationships  . Social connections:    Talks on phone: Not on file    Gets together: Not on file    Attends religious service: Not on file    Active member of club or organization: Not on file    Attends meetings of clubs or organizations: Not on file    Relationship status: Not on file  . Intimate partner violence:    Fear of current or ex partner: Not on file    Emotionally abused: Not on file    Physically abused: Not on file    Forced sexual activity: Not on file  Other Topics Concern  . Not on file  Social History Narrative  . Not on file    Past Surgical History:  Procedure Laterality Date  . APPENDECTOMY    . BREAST LUMPECTOMY     left  . C/S tubal ligation    . CARDIAC CATHETERIZATION     DES to LAD  . CHOLECYSTECTOMY    . colonoscopy with cold snare and snare cautery polypectomy    . CORONARY ANGIOPLASTY  2005   1 stent  . CYSTECTOMY     rt. palm  . HYSTEROSCOPY W/D&C  03/28/2012   Procedure: DILATATION AND CURETTAGE /HYSTEROSCOPY;  Surgeon: Florian Buff, MD;  Location: [facility];  Service: Gynecology;  Laterality: N/A;  . LYMPHADENECTOMY    . stent implant    . TONSILECTOMY, ADENOIDECTOMY, BILATERAL MYRINGOTOMY AND TUBES    . TONSILLECTOMY    . TUBAL LIGATION     with c-section    Past Medical History:  Diagnosis Date  . Anserine bursitis     . Breast cancer ([place])    left lumpectomy  . CAD (coronary artery disease) 2006   DES to LAD  . Cardiomegaly   . Chest pain   . Complication of anesthesia   . Diabetes mellitus without complication ([place])   .  Edema    legs  . GERD (gastroesophageal reflux disease)   . HTN (hypertension)   . Hyperlipidemia   . Kidney stones   . Knee pain   . MI (myocardial infarction) ([place]) 2005  . PONV (postoperative nausea and vomiting)   . Pulmonary embolism (HCC)     Electrocardiogram:   10/11/17 SR rate 74 normal   Assessment and Plan CAD:  Stent LAD 2006 Last myvovue reviewed and normal in 2013  No angina continue medical Rx Chol: labs with primary on statin and zetia  Cardiomegaly: previously normal EF no volume overload observe HTN:  On diuretic and beta blocker home readings normal doing well   Breast Cancer yearly mammogram 10 years survival post Rx    Jenkins Rouge

## 2018-01-31 ENCOUNTER — Encounter: Payer: Self-pay | Admitting: Cardiovascular Disease

## 2018-01-31 ENCOUNTER — Ambulatory Visit (INDEPENDENT_AMBULATORY_CARE_PROVIDER_SITE_OTHER): Payer: Managed Care, Other (non HMO) | Admitting: Cardiovascular Disease

## 2018-01-31 VITALS — BP 156/86 | HR 88 | Ht 63.0 in | Wt 259.2 lb

## 2018-01-31 DIAGNOSIS — I10 Essential (primary) hypertension: Secondary | ICD-10-CM

## 2018-01-31 DIAGNOSIS — I517 Cardiomegaly: Secondary | ICD-10-CM

## 2018-01-31 DIAGNOSIS — E785 Hyperlipidemia, unspecified: Secondary | ICD-10-CM

## 2018-01-31 DIAGNOSIS — I2510 Atherosclerotic heart disease of native coronary artery without angina pectoris: Secondary | ICD-10-CM | POA: Diagnosis not present

## 2018-01-31 NOTE — Patient Instructions (Addendum)

## 2018-02-07 LAB — HEMOGLOBIN A1C: Hemoglobin A1C: 9.5

## 2018-10-29 DIAGNOSIS — I2510 Atherosclerotic heart disease of native coronary artery without angina pectoris: Secondary | ICD-10-CM | POA: Diagnosis not present

## 2018-10-29 DIAGNOSIS — E119 Type 2 diabetes mellitus without complications: Secondary | ICD-10-CM | POA: Diagnosis not present

## 2018-10-29 DIAGNOSIS — I10 Essential (primary) hypertension: Secondary | ICD-10-CM | POA: Diagnosis not present

## 2018-10-29 DIAGNOSIS — E785 Hyperlipidemia, unspecified: Secondary | ICD-10-CM | POA: Diagnosis not present

## 2018-11-22 ENCOUNTER — Other Ambulatory Visit: Payer: Self-pay | Admitting: Cardiovascular Disease

## 2019-02-01 DIAGNOSIS — E1169 Type 2 diabetes mellitus with other specified complication: Secondary | ICD-10-CM | POA: Diagnosis not present

## 2019-02-01 DIAGNOSIS — I10 Essential (primary) hypertension: Secondary | ICD-10-CM | POA: Diagnosis not present

## 2019-02-01 DIAGNOSIS — E1165 Type 2 diabetes mellitus with hyperglycemia: Secondary | ICD-10-CM | POA: Diagnosis not present

## 2019-02-01 DIAGNOSIS — Z23 Encounter for immunization: Secondary | ICD-10-CM | POA: Diagnosis not present

## 2019-02-01 DIAGNOSIS — E785 Hyperlipidemia, unspecified: Secondary | ICD-10-CM | POA: Diagnosis not present

## 2019-02-01 DIAGNOSIS — I2510 Atherosclerotic heart disease of native coronary artery without angina pectoris: Secondary | ICD-10-CM | POA: Diagnosis not present

## 2019-02-02 LAB — COMPREHENSIVE METABOLIC PANEL
ALBUMIN/GLOBULIN RATIO: 2.1
ALT: 17 (ref 3–30)
AST: 16
Albumin: 4.5
Alkaline Phosphatase: 69
BUN: 19 (ref 4–21)
Calcium: 9.8
Carbon Dioxide, Total: 24
Chloride: 103
Creat: 0.64
EGFR (African American): 109
EGFR (Non-African Amer.): 94
Globulin: 2.1
Glucose: 142
Potassium: 4.5
Sodium: 140
Total Bilirubin: 0.4
Total Protein: 6.6 (ref 6.4–8.2)

## 2019-02-02 LAB — LIPID PANEL
Chol/HDL Ratio: 4.7
Cholesterol: 207
HDL Cholesterol: 44 (ref 35–70)
LDL Cholesterol: 135
Non HDL Cholesterol: 163
Triglycerides: 146 (ref 40–160)

## 2019-02-02 LAB — CBC
BASO(ABSOLUTE): 40
Basophils: 0.8
Eosinophils Absolute: 110
Eosinophils, %: 2.2
HCT: 43 — AB (ref 29–41)
Hemoglobin: 14.5
Lymphocytes absolute: 2360 10*3/uL — AB (ref 0.1–1.8)
Lymphocytes: 47.2
MCH: 31.3
MCHC: 33.6
MCV: 93.1 (ref 76–111)
MPV: 9.4 fL (ref 0–99.8)
Monocytes(Absolute): 360
Monocytes: 7.2
Neutrophils absolute (GR#): 2130 10*3/uL — AB (ref 1.7–7.8)
Neutrophils: 42.6
RBC: 4.64 (ref 3.87–5.11)
RDW: 12
WBC: 5
platelet count: 332

## 2019-02-02 LAB — TSH: TSH: 1.06

## 2019-02-02 LAB — HEMOGLOBIN A1C: Hgb A1c MFr Bld: 7.3 — AB (ref 4.0–6.0)

## 2019-02-20 LAB — HM DIABETES EYE EXAM

## 2019-03-04 ENCOUNTER — Other Ambulatory Visit: Payer: Self-pay | Admitting: Cardiovascular Disease

## 2019-05-08 ENCOUNTER — Other Ambulatory Visit: Payer: Self-pay

## 2019-05-08 ENCOUNTER — Ambulatory Visit (INDEPENDENT_AMBULATORY_CARE_PROVIDER_SITE_OTHER): Payer: Medicare Other | Admitting: Family Medicine

## 2019-05-08 ENCOUNTER — Encounter: Payer: Self-pay | Admitting: Family Medicine

## 2019-05-08 VITALS — BP 160/90 | HR 97 | Temp 98.2°F | Ht 63.0 in | Wt 248.0 lb

## 2019-05-08 DIAGNOSIS — E1140 Type 2 diabetes mellitus with diabetic neuropathy, unspecified: Secondary | ICD-10-CM | POA: Diagnosis not present

## 2019-05-08 DIAGNOSIS — E785 Hyperlipidemia, unspecified: Secondary | ICD-10-CM

## 2019-05-08 DIAGNOSIS — K219 Gastro-esophageal reflux disease without esophagitis: Secondary | ICD-10-CM | POA: Diagnosis not present

## 2019-05-08 DIAGNOSIS — I10 Essential (primary) hypertension: Secondary | ICD-10-CM

## 2019-05-08 DIAGNOSIS — I2510 Atherosclerotic heart disease of native coronary artery without angina pectoris: Secondary | ICD-10-CM

## 2019-05-08 NOTE — Progress Notes (Signed)
New Patient Office Visit  Subjective:  Patient ID: [name] [name], female    DOB: [birth year]  Age: 63 y.o. MRN: [id number]  CC:  Chief Complaint  Patient presents with  . Establish Care  DM GERD HTN-metoprolol, no longer taking HCTZ-previously 12.5mg  Hyperlipidemia-rosuvastatin/gemfibrozil/Omega 3/-no longer taking zetia due to cost AR-Claritin-no shots Osteopenia-DEXA 2018 HPI [name] [name] presents for DM-HTN-Hyperlipidemia DM-pt is not taking medications-diet control-no h/o diabetic ulcer disease-regular eye appts  Cardiology-pt has not seen for appt since 9/19-no CP/SOB HTN-pt checking at home with 140/90 on home monitor-ran out of HCTZ since no cardio follow up 2020 due to COVID Hyperlipidemia-aggressive control with triple therapy-zetia to expensive after retirement Reviewed labwork with pt  Past Medical History:  Diagnosis Date  . Anserine bursitis   . Breast cancer ([place])    left lumpectomy  . CAD (coronary artery disease) 2006   DES to LAD  . Cardiomegaly   . Chest pain   . Complication of anesthesia   . Diabetes mellitus without complication ([place])   . Edema    legs  . GERD (gastroesophageal reflux disease)   . HTN (hypertension)   . Hyperlipidemia   . Kidney stones   . Knee pain   . MI (myocardial infarction) ([place]) 2005  . PONV (postoperative nausea and vomiting)   . Pulmonary embolism [facility])     Past Surgical History:  Procedure Laterality Date  . APPENDECTOMY    . BREAST LUMPECTOMY     left  . C/S tubal ligation    . CARDIAC CATHETERIZATION     DES to LAD  . CHOLECYSTECTOMY    . colonoscopy with cold snare and snare cautery polypectomy    . CORONARY ANGIOPLASTY  2005   1 stent  . CYSTECTOMY     rt. palm  . HYSTEROSCOPY WITH D & C  03/28/2012   Procedure: DILATATION AND CURETTAGE /HYSTEROSCOPY;  Surgeon: Florian Buff, MD;  Location: [facility];  Service: Gynecology;  Laterality: N/A;  . LYMPHADENECTOMY    . stent implant    . TONSILECTOMY,  ADENOIDECTOMY, BILATERAL MYRINGOTOMY AND TUBES    . TONSILLECTOMY    . TUBAL LIGATION     with c-section    Family History  Problem Relation Age of Onset  . Coronary artery disease Unknown   . Thrombophlebitis Unknown   . Arthritis Unknown   . Cancer Unknown   . Lung disease Unknown   . Asthma Unknown   . Diabetes Unknown     Social History   Socioeconomic History  . Marital status: Divorced    Spouse name: Not on file  . Number of children: Not on file  . Years of education: Not on file  . Highest education level: Not on file  Occupational History  . Not on file  Tobacco Use  . Smoking status: Former Smoker    Packs/day: 1.50    Years: 35.00    Pack years: 52.50    Types: Cigarettes    Quit date: 03/22/2004    Years since quitting: 15.1  . Smokeless tobacco: Never Used  Substance and Sexual Activity  . Alcohol use: No  . Drug use: No  . Sexual activity: Yes    Birth control/protection: Surgical  Other Topics Concern  . Not on file  Social History Narrative  . Not on file   Social Determinants of Health   Financial Resource Strain:   . Difficulty of Paying Living Expenses: Not on file  Food  Insecurity:   . Worried About Charity fundraiser in the Last Year: Not on file  . Ran Out of Food in the Last Year: Not on file  Transportation Needs:   . Lack of Transportation (Medical): Not on file  . Lack of Transportation (Non-Medical): Not on file  Physical Activity:   . Days of Exercise per Week: Not on file  . Minutes of Exercise per Session: Not on file  Stress:   . Feeling of Stress : Not on file  Social Connections:   . Frequency of Communication with Friends and Family: Not on file  . Frequency of Social Gatherings with Friends and Family: Not on file  . Attends Religious Services: Not on file  . Active Member of Clubs or Organizations: Not on file  . Attends Archivist Meetings: Not on file  . Marital Status: Not on file  Intimate Partner  Violence:   . Fear of Current or Ex-Partner: Not on file  . Emotionally Abused: Not on file  . Physically Abused: Not on file  . Sexually Abused: Not on file    ROS Review of Systems  Constitutional: Negative.   HENT: Negative.   Respiratory: Negative.   Cardiovascular:       CAD  Endocrine:       DM  Genitourinary: Negative.   Musculoskeletal: Negative.   Skin: Negative.   Allergic/Immunologic: Negative.   Neurological: Negative.     Objective:   Today's Vitals: BP (!) 160/100 (BP Location: Right Arm, Patient Position: Sitting, Cuff Size: Normal)   Pulse 97   Temp 98.2 F (36.8 C) (Oral)   Ht 5\' 3"  (1.6 m)   Wt 248 lb (112.5 kg)   SpO2 97%   BMI 43.93 kg/m   Physical Exam HENT:     Head: Atraumatic.  Eyes:     Conjunctiva/sclera: Conjunctivae normal.  Cardiovascular:     Rate and Rhythm: Normal rate and regular rhythm.     Pulses: Normal pulses.  Musculoskeletal:     Cervical back: Neck supple.     Right lower leg: No edema.     Left lower leg: No edema.  Neurological:     Mental Status: She is oriented to person, place, and time.  Psychiatric:        Behavior: Behavior normal.     Assessment & Plan:   1. Type 2 diabetes mellitus with diabetic neuropathy, without long-term current use of insulin (HCC) No medication for DM-diet control-A1v 7.3%  2. Gastroesophageal reflux disease without esophagitis No medication-controlled with diet  3. Atherosclerosis of native coronary artery of native heart without angina pectoris Asa-no recent cardio visit-advised f/w per recommendations  4. Elevated lipids Gemfibrozil/crestor/omega3 5. Essential hypertension Pt is not currently taking HCTZ-ran out, taking metoprolol-bp at home stable, concern for elevation today off HCTZ-pt to take bp at home-if continued elevation will restart HCTZ. Pt understands need to recheck BMP if medication restart.   Outpatient Encounter Medications as of 05/08/2019  Medication Sig   . BIOTIN 5000 PO Take 5,000 mg by mouth daily.  Marland Kitchen loratadine (CLARITIN) 10 MG tablet Take 10 mg by mouth daily.  Orlie Dakin Sodium (SENNA-S PO) Take by mouth daily as needed.  . Acetaminophen (TYLENOL PO) Take 650-1,000 mg by mouth daily as needed (pain).  Marland Kitchen aspirin EC 81 MG tablet Take 81 mg by mouth daily.  . Cholecalciferol (VITAMIN D) 2000 UNITS CAPS Take 1 capsule by mouth daily.  Marland Kitchen docusate sodium (COLACE)  100 MG capsule Take 100 mg by mouth daily. constipation  . fish oil-omega-3 fatty acids 1000 MG capsule Take 1 g by mouth 2 (two) times daily.   Marland Kitchen gemfibrozil (LOPID) 600 MG tablet Take 600 mg by mouth 2 (two) times daily before a meal.  . hydrochlorothiazide (HYDRODIURIL) 12.5 MG tablet TAKE 1 TABLET BY MOUTH ONCE DAILY  . metoprolol succinate (TOPROL-XL) 50 MG 24 hr tablet Take 1 tablet (50 mg total) by mouth daily. Please make overdue appt with Dr. Johnsie Cancel before anymore refills. 2nd attempt  . Multiple Vitamin (MULTIVITAMIN WITH MINERALS) TABS tablet Take 0.5 tablets by mouth 2 (two) times daily.  . nitroGLYCERIN (NITROSTAT) 0.4 MG SL tablet Place 1 tablet (0.4 mg total) under the tongue every 5 (five) minutes as needed for chest pain.  . rosuvastatin (CRESTOR) 20 MG tablet Take 20 mg by mouth at bedtime.   . vitamin B-12 (CYANOCOBALAMIN) 500 MCG tablet Take 500 mcg by mouth daily.    . [DISCONTINUED] ezetimibe (ZETIA) 10 MG tablet Take 10 mg by mouth at bedtime.   . [DISCONTINUED] piroxicam (FELDENE) 20 MG capsule Take 20 mg by mouth daily as needed (swelling).   No facility-administered encounter medications on file as of 05/08/2019.    Follow-up: No follow-ups on file.   Gerasimos Plotts Hannah Beat, MD

## 2019-05-08 NOTE — Patient Instructions (Addendum)
Vit D 2000IU/daily Calcium 500-600 twice a day  Take blood pressure-first thing in the morning Take metoprolol at night

## 2019-05-09 DIAGNOSIS — I10 Essential (primary) hypertension: Secondary | ICD-10-CM | POA: Insufficient documentation

## 2019-05-13 ENCOUNTER — Telehealth: Payer: Self-pay | Admitting: Family Medicine

## 2019-05-13 NOTE — Telephone Encounter (Signed)
I spoke to her she is coming in this afternoon for me to show her how to use her new blood pressure cuff

## 2019-05-13 NOTE — Telephone Encounter (Signed)
Patient is calling and states she got a blood pressure cuff as directed by Dr. Holly Bodily, and she is unsure if she is doing it right. Requesting call back

## 2019-05-27 NOTE — Progress Notes (Signed)
Patient ID: [name] [name], female   DOB: [birth year], 64 y.o.   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 Previous breast cancer with lumpectomy and adjuvant chemo. Previous smoker with HLD and CAD with stent to LAD in 02-Aug-2004  History of cardiomegaly. Anginal pain was atypical with shoulder and back pain. Last normal stress test myovue August 2013  Myovue 8/13 normal EF 67%    Recovered from left humeral fracture 02-Aug-2012 Fractured left foot 2013-08-02   Both parents died in 04/04/17  after falls they were 85/66 yo June 2020  beta blocker and diuretic dose adjusted BP up with stress She since has stopped diuretic  HLD on crestor/gemfibrozil and Omega 3 stopped zetia due to cost   New primary [name] [name] established 05/08/19 she had her take Toprol in evening but HR high during day Home BP readings still high She did not want to start a 3 rd medicine for BP  Retired in April but COVID has not made it fun    ROS: Denies fever, malais, weight loss, blurry vision, decreased visual acuity, cough, sputum, SOB, hemoptysis, pleuritic pain, palpitaitons, heartburn, abdominal pain, melena, lower extremity edema, claudication, or rash.  All other systems reviewed and negative   General: Filed Weights   05/28/19 1512  Weight: 245 lb 6.4 oz (111.3 kg)  BP (!) 154/88   Pulse (!) 112   Ht 5' 3.5" (1.613 m)   Wt 245 lb 6.4 oz (111.3 kg)   SpO2 96%   BMI 42.79 kg/m   Repeat BP after sitting 135/85 and home readings are normal  Affect appropriate Overweight white female  HEENT: normal Neck supple with no adenopathy JVP normal no bruits no thyromegaly Lungs clear with no wheezing and good diaphragmatic motion Heart:  S1/S2 no murmur, no rub, gallop or click PMI normal Abdomen: benighn, BS positve, no tenderness, no AAA no bruit.  No HSM or HJR Distal pulses intact with no bruits No edema Neuro non-focal Skin warm and dry No muscular weakness    Medications Current Outpatient Medications  Medication Sig Dispense Refill  . Acetaminophen (8 HR ARTHRITIS PAIN RELIEF PO) Take 1 tablet by mouth 2 (two) times daily.    . Acetaminophen (TYLENOL PO) Take 650-1,000 mg by mouth daily as needed (pain).    Marland Kitchen aspirin EC 81 MG tablet Take 81 mg by mouth daily.    Marland Kitchen BIOTIN 5000 PO Take 5,000 mg by mouth daily.    . Cholecalciferol (VITAMIN D) 2000 UNITS CAPS Take 1 capsule by mouth daily.    Marland Kitchen docusate sodium (COLACE) 100 MG capsule Take 100 mg by mouth daily as needed. constipation    . fish oil-omega-3 fatty acids 1000 MG capsule Take 1 g by mouth 2 (two) times daily.     Marland Kitchen gemfibrozil (LOPID) 600 MG tablet Take 600 mg by mouth 2 (two) times daily before a meal.    . hydrochlorothiazide (HYDRODIURIL) 12.5 MG tablet TAKE 1 TABLET BY MOUTH ONCE DAILY 90 tablet 2  . loratadine (CLARITIN) 10 MG tablet Take 10 mg by mouth daily.    . metoprolol succinate (TOPROL-XL) 50 MG 24 hr tablet Take 1 tablet (50 mg total) by mouth daily. Please make overdue appt with [name] [name] before anymore refills. 2nd attempt 15 tablet 0  . Multiple Vitamin (MULTIVITAMIN WITH MINERALS) TABS tablet Take 0.5 tablets by mouth 2 (two) times daily.    . nitroGLYCERIN (NITROSTAT) 0.4 MG  SL tablet Place 1 tablet (0.4 mg total) under the tongue every 5 (five) minutes as needed for chest pain. 25 tablet 3  . rosuvastatin (CRESTOR) 20 MG tablet Take 20 mg by mouth at bedtime.     [name] [name] Sodium (SENNA-S PO) Take by mouth daily as needed.    . vitamin B-12 (CYANOCOBALAMIN) 500 MCG tablet Take 500 mcg by mouth daily.       No current facility-administered medications for this visit.    Allergies Vicodin [hydrocodone-acetaminophen], Codeine, Doxycycline, Pentazocine lactate, Percocet [oxycodone-acetaminophen], and Duricef [cefadroxil monohydrate]  Family History: Family History  Problem Relation Age of Onset  . Coronary artery disease Unknown   . Thrombophlebitis  Unknown   . Arthritis Unknown   . Cancer Unknown   . Lung disease Unknown   . Asthma Unknown   . Diabetes Unknown     Social History: Social History   Socioeconomic History  . Marital status: Divorced    Spouse name: Not on file  . Number of children: Not on file  . Years of education: Not on file  . Highest education level: Not on file  Occupational History  . Not on file  Tobacco Use  . Smoking status: Former Smoker    Packs/day: 1.50    Years: 35.00    Pack years: 52.50    Types: Cigarettes    Quit date: 03/22/2004    Years since quitting: 15.1  . Smokeless tobacco: Never Used  Substance and Sexual Activity  . Alcohol use: No  . Drug use: No  . Sexual activity: Yes    Birth control/protection: Surgical  Other Topics Concern  . Not on file  Social History Narrative  . Not on file   Social Determinants of Health   Financial Resource Strain:   . Difficulty of Paying Living Expenses: Not on file  Food Insecurity:   . Worried About Charity fundraiser in the Last Year: Not on file  . Ran Out of Food in the Last Year: Not on file  Transportation Needs:   . Lack of Transportation (Medical): Not on file  . Lack of Transportation (Non-Medical): Not on file  Physical Activity:   . Days of Exercise per Week: Not on file  . Minutes of Exercise per Session: Not on file  Stress:   . Feeling of Stress : Not on file  Social Connections:   . Frequency of Communication with Friends and Family: Not on file  . Frequency of Social Gatherings with Friends and Family: Not on file  . Attends Religious Services: Not on file  . Active Member of Clubs or Organizations: Not on file  . Attends Archivist Meetings: Not on file  . Marital Status: Not on file  Intimate Partner Violence:   . Fear of Current or Ex-Partner: Not on file  . Emotionally Abused: Not on file  . Physically Abused: Not on file  . Sexually Abused: Not on file    Past Surgical History:  Procedure  Laterality Date  . APPENDECTOMY    . BREAST LUMPECTOMY     left  . C/S tubal ligation    . CARDIAC CATHETERIZATION     DES to LAD  . CHOLECYSTECTOMY    . colonoscopy with cold snare and snare cautery polypectomy    . CORONARY ANGIOPLASTY  2005   1 stent  . CYSTECTOMY     rt. palm  . HYSTEROSCOPY WITH D & C  03/28/2012   Procedure:  DILATATION AND CURETTAGE /HYSTEROSCOPY;  Surgeon: Florian Buff, MD;  Location: [facility];  Service: Gynecology;  Laterality: N/A;  . LYMPHADENECTOMY    . stent implant    . TONSILECTOMY, ADENOIDECTOMY, BILATERAL MYRINGOTOMY AND TUBES    . TONSILLECTOMY    . TUBAL LIGATION     with c-section    Past Medical History:  Diagnosis Date  . Anserine bursitis   . Breast cancer ([place])    left lumpectomy  . CAD (coronary artery disease) 2006   DES to LAD  . Cardiomegaly   . Chest pain   . Complication of anesthesia   . Diabetes mellitus without complication ([place])   . Edema    legs  . GERD (gastroesophageal reflux disease)   . HTN (hypertension)   . Hyperlipidemia   . Kidney stones   . Knee pain   . MI (myocardial infarction) ([place]) 2005  . PONV (postoperative nausea and vomiting)   . Pulmonary embolism (HCC)     Electrocardiogram:   10/11/17 SR rate 74 normal 05/28/19 ST rate 113 otherwise normal   Assessment and Plan  CAD:  Stent LAD 2006 Last myvovue reviewed and normal in 2013  No angina continue medical Rx Chol: labs with primary on statin and zetia  Cardiomegaly: previously normal EF no volume overload with tachycardia on ECG will recheck echo  HTN:  Not ideal and tachycardic. Change to short acting lopressor 75 mg bid and take diuretic at lunch  Breast Cancer yearly mammogram 10 years survival post Rx    Echo CE Lopressor 75 bid D/c Toprol F/U 6 months [place]   Jenkins Rouge

## 2019-05-28 ENCOUNTER — Encounter: Payer: Self-pay | Admitting: Cardiovascular Disease

## 2019-05-28 ENCOUNTER — Other Ambulatory Visit: Payer: Self-pay

## 2019-05-28 ENCOUNTER — Ambulatory Visit (INDEPENDENT_AMBULATORY_CARE_PROVIDER_SITE_OTHER): Payer: Medicare Other | Admitting: Cardiovascular Disease

## 2019-05-28 VITALS — BP 154/88 | HR 112 | Ht 63.5 in | Wt 245.4 lb

## 2019-05-28 DIAGNOSIS — I10 Essential (primary) hypertension: Secondary | ICD-10-CM

## 2019-05-28 DIAGNOSIS — R000 Tachycardia, unspecified: Secondary | ICD-10-CM | POA: Diagnosis not present

## 2019-05-28 MED ORDER — METOPROLOL TARTRATE 50 MG PO TABS: 75.0000 mg | ORAL_TABLET | Freq: Two times a day (BID) | ORAL | 3 refills | Status: DC

## 2019-05-28 NOTE — Patient Instructions (Addendum)
Medication Instructions:  Your physician has recommended you make the following change in your medication:  1-STOP Metoprolol Succinate 2-START Metoprolol Tartrate 75 mg by mouth twice daily.  *If you need a refill on your cardiac medications before your next appointment, please call your pharmacy*  Lab Work: If you have labs (blood work) drawn today and your tests are completely normal, you will receive your results only by: Marland Kitchen MyChart Message (if you have MyChart) OR . A paper copy in the mail If you have any lab test that is abnormal or we need to change your treatment, we will call you to review the results.  Testing/Procedures: Your physician has requested that you have an echocardiogram. Echocardiography is a painless test that uses sound waves to create images of your heart. It provides your doctor with information about the size and shape of your heart and how well your heart's chambers and valves are working. This procedure takes approximately one hour. There are no restrictions for this procedure.  Follow-Up: At [facility], you and your health needs are our priority.  As part of our continuing mission to provide you with exceptional heart care, we have created designated Provider Care Teams.  These Care Teams include your primary Cardiologist (physician) and Advanced Practice Providers (APPs -  Physician Assistants and Nurse Practitioners) who all work together to provide you with the care you need, when you need it.  Your next appointment:   6 month(s)  The format for your next appointment:   In Person  Provider:   follow up with Dr. Johnsie Cancel in [place]

## 2019-05-30 ENCOUNTER — Other Ambulatory Visit: Payer: Self-pay | Admitting: Cardiovascular Disease

## 2019-05-30 MED ORDER — NITROGLYCERIN 0.4 MG SL SUBL: 0.4000 mg | SUBLINGUAL_TABLET | SUBLINGUAL | 6 refills | Status: DC | PRN

## 2019-05-30 MED ORDER — HYDROCHLOROTHIAZIDE 12.5 MG PO TABS: 12.5000 mg | ORAL_TABLET | Freq: Every day | ORAL | 3 refills | Status: DC

## 2019-05-30 NOTE — Telephone Encounter (Signed)
Pt's medications were sent to pt's pharmacy as requested. Confirmation received.  

## 2019-06-04 ENCOUNTER — Encounter (HOSPITAL_COMMUNITY): Payer: Self-pay | Admitting: Radiology

## 2019-06-11 ENCOUNTER — Ambulatory Visit (HOSPITAL_COMMUNITY): Payer: Medicare Other

## 2019-06-17 ENCOUNTER — Other Ambulatory Visit: Payer: Self-pay

## 2019-06-17 ENCOUNTER — Ambulatory Visit (HOSPITAL_COMMUNITY)
Admission: RE | Admit: 2019-06-17 | Discharge: 2019-06-17 | Disposition: A | Discharge status: Home / Self Care | Payer: Medicare Other | Source: Ambulatory Visit | Attending: Cardiovascular Disease | Admitting: Cardiovascular Disease

## 2019-06-17 DIAGNOSIS — R000 Tachycardia, unspecified: Secondary | ICD-10-CM | POA: Insufficient documentation

## 2019-06-17 DIAGNOSIS — I10 Essential (primary) hypertension: Secondary | ICD-10-CM | POA: Diagnosis not present

## 2019-06-17 NOTE — Progress Notes (Signed)
*  PRELIMINARY RESULTS* Echocardiogram 2D Echocardiogram has been performed.  [name] [name] 06/17/2019, 12:31 PM

## 2019-06-19 ENCOUNTER — Other Ambulatory Visit (HOSPITAL_COMMUNITY): Payer: Medicare Other

## 2019-06-26 ENCOUNTER — Other Ambulatory Visit: Payer: Self-pay

## 2019-06-26 DIAGNOSIS — E785 Hyperlipidemia, unspecified: Secondary | ICD-10-CM

## 2019-06-26 MED ORDER — GEMFIBROZIL 600 MG PO TABS: 600.0000 mg | ORAL_TABLET | Freq: Two times a day (BID) | ORAL | 0 refills | Status: DC

## 2019-07-04 DIAGNOSIS — Z23 Encounter for immunization: Secondary | ICD-10-CM | POA: Diagnosis not present

## 2019-07-29 ENCOUNTER — Telehealth: Payer: Self-pay | Admitting: Family Medicine

## 2019-07-29 ENCOUNTER — Other Ambulatory Visit: Payer: Self-pay | Admitting: Emergency Medicine

## 2019-07-29 DIAGNOSIS — E785 Hyperlipidemia, unspecified: Secondary | ICD-10-CM

## 2019-07-29 MED ORDER — ROSUVASTATIN CALCIUM 20 MG PO TABS: 20.0000 mg | ORAL_TABLET | Freq: Every day | ORAL | 1 refills | Status: DC

## 2019-07-29 NOTE — Telephone Encounter (Signed)
Patient is calling and states she has been trying to get the following medication refilled and that [facility] has been telling her they have been sending a request over. Please advise.   rosuvastatin (CRESTOR) 20 MG tablet    [place], [place] - [address] [place] [address] [place] Phone:  [phone]  Fax:  [phone]

## 2019-07-29 NOTE — Telephone Encounter (Signed)
Rx have been sent for crestor

## 2019-08-02 DIAGNOSIS — Z23 Encounter for immunization: Secondary | ICD-10-CM | POA: Diagnosis not present

## 2019-09-05 ENCOUNTER — Other Ambulatory Visit: Payer: Self-pay

## 2019-09-05 ENCOUNTER — Ambulatory Visit (INDEPENDENT_AMBULATORY_CARE_PROVIDER_SITE_OTHER): Payer: Medicare Other | Admitting: Family Medicine

## 2019-09-05 ENCOUNTER — Encounter: Payer: Self-pay | Admitting: Family Medicine

## 2019-09-05 VITALS — BP 144/77 | HR 76 | Temp 97.6°F | Ht 63.0 in | Wt 249.4 lb

## 2019-09-05 DIAGNOSIS — I2510 Atherosclerotic heart disease of native coronary artery without angina pectoris: Secondary | ICD-10-CM | POA: Diagnosis not present

## 2019-09-05 DIAGNOSIS — K219 Gastro-esophageal reflux disease without esophagitis: Secondary | ICD-10-CM | POA: Diagnosis not present

## 2019-09-05 DIAGNOSIS — E785 Hyperlipidemia, unspecified: Secondary | ICD-10-CM

## 2019-09-05 DIAGNOSIS — E1140 Type 2 diabetes mellitus with diabetic neuropathy, unspecified: Secondary | ICD-10-CM | POA: Diagnosis not present

## 2019-09-05 DIAGNOSIS — I10 Essential (primary) hypertension: Secondary | ICD-10-CM

## 2019-09-05 MED ORDER — GLUCOSE BLOOD VI STRP: ORAL_STRIP | 1 refills | Status: DC

## 2019-09-05 MED ORDER — MICROLET LANCETS MISC: 1.0000 | Freq: Once | 1 refills | Status: AC

## 2019-09-05 MED ORDER — GEMFIBROZIL 600 MG PO TABS: 600.0000 mg | ORAL_TABLET | Freq: Two times a day (BID) | ORAL | 1 refills | Status: DC

## 2019-09-05 NOTE — Progress Notes (Signed)
Established Patient Office Visit  Subjective:  Patient ID: [name] [name], female    DOB: 03-13-1956  Age: 64 y.o. MRN: [id number]  CC:  Chief Complaint  Patient presents with  . Follow-up    supposed to be a 6 month f/u but was moved up to 3 month f/u since Dr Holly Bodily is leaving. Need refills on meds and also need a refill on Bactrim DS    HPI [name] [name] presents for HTN-HCTZ/Lopressor daily-no headaches, LE edema still noted Hyperlipidemia-Fish Oil/Crestor-LDL 135/Gemfibrozil GERD-no meds -took PPI in the past-use Tums currently DM-7.3%-managed with diet and exercise-no medications in the past-pt does not want medication-no labwork since 9/20 Cellulitis on left breast-intermittently-Bactrim DM-taken prn -Dr. Luan Pulling gave pt standing order for medication   Past Medical History:  Diagnosis Date  . Anserine bursitis   . Breast cancer ([place])    left lumpectomy  . CAD (coronary artery disease) 2006   DES to LAD  . Cardiomegaly   . Chest pain   . Complication of anesthesia   . Diabetes mellitus without complication ([place])   . Edema    legs  . GERD (gastroesophageal reflux disease)   . HTN (hypertension)   . Hyperlipidemia   . Kidney stones   . Knee pain   . MI (myocardial infarction) ([place]) 2005  . PONV (postoperative nausea and vomiting)   . Pulmonary embolism [facility])     Past Surgical History:  Procedure Laterality Date  . APPENDECTOMY    . BREAST LUMPECTOMY     left  . C/S tubal ligation    . CARDIAC CATHETERIZATION     DES to LAD  . CHOLECYSTECTOMY    . colonoscopy with cold snare and snare cautery polypectomy    . CORONARY ANGIOPLASTY  2005   1 stent  . CYSTECTOMY     rt. palm  . HYSTEROSCOPY WITH D & C  03/28/2012   Procedure: DILATATION AND CURETTAGE /HYSTEROSCOPY;  Surgeon: Florian Buff, MD;  Location: [facility];  Service: Gynecology;  Laterality: N/A;  . LYMPHADENECTOMY    . stent implant    . TONSILECTOMY, ADENOIDECTOMY, BILATERAL MYRINGOTOMY AND TUBES    .  TONSILLECTOMY    . TUBAL LIGATION     with c-section    Family History  Problem Relation Age of Onset  . Coronary artery disease Unknown   . Thrombophlebitis Unknown   . Arthritis Unknown   . Cancer Unknown   . Lung disease Unknown   . Asthma Unknown   . Diabetes Unknown     Social History   Socioeconomic History  . Marital status: Divorced    Spouse name: Not on file  . Number of children: Not on file  . Years of education: Not on file  . Highest education level: Not on file  Occupational History  . Not on file  Tobacco Use  . Smoking status: Former Smoker    Packs/day: 1.50    Years: 35.00    Pack years: 52.50    Types: Cigarettes    Quit date: 03/22/2004    Years since quitting: 15.4  . Smokeless tobacco: Never Used  Substance and Sexual Activity  . Alcohol use: No  . Drug use: No  . Sexual activity: Yes    Birth control/protection: Surgical  Other Topics Concern  . Not on file  Social History Narrative  . Not on file   Social Determinants of Health   Financial Resource Strain:   . Difficulty  of Paying Living Expenses:   Food Insecurity:   . Worried About Charity fundraiser in the Last Year:   . Arboriculturist in the Last Year:   Transportation Needs:   . Film/video editor (Medical):   Marland Kitchen Lack of Transportation (Non-Medical):   Physical Activity:   . Days of Exercise per Week:   . Minutes of Exercise per Session:   Stress:   . Feeling of Stress :   Social Connections:   . Frequency of Communication with Friends and Family:   . Frequency of Social Gatherings with Friends and Family:   . Attends Religious Services:   . Active Member of Clubs or Organizations:   . Attends Archivist Meetings:   Marland Kitchen Marital Status:   Intimate Partner Violence:   . Fear of Current or Ex-Partner:   . Emotionally Abused:   Marland Kitchen Physically Abused:   . Sexually Abused:     Outpatient Medications Prior to Visit  Medication Sig Dispense Refill  .  Acetaminophen (8 HR ARTHRITIS PAIN RELIEF PO) Take 1 tablet by mouth 2 (two) times daily.    . Acetaminophen (TYLENOL PO) Take 650-1,000 mg by mouth daily as needed (pain).    Marland Kitchen aspirin EC 81 MG tablet Take 81 mg by mouth daily.    Marland Kitchen BIOTIN 5000 PO Take 5,000 mg by mouth daily.    . Cholecalciferol (VITAMIN D) 2000 UNITS CAPS Take 1 capsule by mouth daily.    Marland Kitchen docusate sodium (COLACE) 100 MG capsule Take 100 mg by mouth daily as needed. constipation    . fish oil-omega-3 fatty acids 1000 MG capsule Take 1 g by mouth 2 (two) times daily.     Marland Kitchen gemfibrozil (LOPID) 600 MG tablet Take 1 tablet (600 mg total) by mouth 2 (two) times daily before a meal. 180 tablet 0  . hydrochlorothiazide (HYDRODIURIL) 12.5 MG tablet Take 1 tablet (12.5 mg total) by mouth daily. 90 tablet 3  . loratadine (CLARITIN) 10 MG tablet Take 10 mg by mouth daily.    . metoprolol tartrate (LOPRESSOR) 50 MG tablet Take 1.5 tablets (75 mg total) by mouth 2 (two) times daily. 270 tablet 3  . Multiple Vitamin (MULTIVITAMIN WITH MINERALS) TABS tablet Take 0.5 tablets by mouth 2 (two) times daily.    . nitroGLYCERIN (NITROSTAT) 0.4 MG SL tablet Place 1 tablet (0.4 mg total) under the tongue every 5 (five) minutes as needed for chest pain. 25 tablet 6  . rosuvastatin (CRESTOR) 20 MG tablet Take 1 tablet (20 mg total) by mouth at bedtime. 90 tablet 1  . Sennosides-Docusate Sodium (SENNA-S PO) Take by mouth daily as needed.    . vitamin B-12 (CYANOCOBALAMIN) 500 MCG tablet Take 500 mcg by mouth daily.       No facility-administered medications prior to visit.    Allergies  Allergen Reactions  . Vicodin [Hydrocodone-Acetaminophen] Shortness Of Breath, Nausea And Vomiting and Swelling  . Codeine Nausea And Vomiting  . Doxycycline Nausea And Vomiting  . Pentazocine Lactate Nausea And Vomiting        . Percocet [Oxycodone-Acetaminophen] Nausea And Vomiting  . Duricef [Cefadroxil Monohydrate] Rash    respiratory distress      ROS Review of Systems  Constitutional: Negative.   HENT: Negative.   Eyes: Negative.   Respiratory: Negative.   Cardiovascular: Positive for leg swelling.  Genitourinary: Negative.   Musculoskeletal: Positive for arthralgias.       Acetaminophen  Skin:  Cellulitis-left breast intermittently-pt states she wants antibiotics so she can take them as needed  Allergic/Immunologic: Negative.   Hematological: Negative.   Psychiatric/Behavioral: Positive for agitation.       Pt upset and crying when told we could not refill antibiotics to use prn      Objective:    Physical Exam  Constitutional: She is oriented to person, place, and time. She appears well-developed and well-nourished.  HENT:  Head: Normocephalic and atraumatic.  Cardiovascular: Normal rate, regular rhythm, normal heart sounds and intact distal pulses.  Pulmonary/Chest: Effort normal and breath sounds normal.  Musculoskeletal:     Right shoulder: Swelling present.     Right ankle: Swelling present.     Left ankle: Swelling present.  Neurological: She is oriented to person, place, and time.  Psychiatric: She has a normal mood and affect. Her behavior is normal.    BP (!) 144/77 (BP Location: Right Arm, Patient Position: Sitting, Cuff Size: Large)   Pulse 76   Temp 97.6 F (36.4 C) (Temporal)   Ht 5\' 3"  (1.6 m)   Wt 249 lb 6.4 oz (113.1 kg)   SpO2 96%   BMI 44.18 kg/m  Wt Readings from Last 3 Encounters:  09/05/19 249 lb 6.4 oz (113.1 kg)  05/28/19 245 lb 6.4 oz (111.3 kg)  05/08/19 248 lb (112.5 kg)     Health Maintenance Due  Topic Date Due  . Hepatitis C Screening  Never done  . FOOT EXAM  Never done  . URINE MICROALBUMIN  Never done  . COVID-19 Vaccine (1) Never done  . TETANUS/TDAP  Never done  . COLONOSCOPY  Never done  . PNA vac Low Risk Adult (1 of 2 - PCV13) Never done  . HEMOGLOBIN A1C  08/01/2019  . MAMMOGRAM  08/15/2019    Lab Results  Component Value Date   TSH 1.06  02/01/2019   Lab Results  Component Value Date   WBC 5.0 02/01/2019   HGB 13.7 10/25/2015   HCT 43 (A) 02/01/2019   MCV 93.1 02/01/2019   PLT 264 10/25/2015   Lab Results  Component Value Date   NA 140 02/01/2019   K 4.5 02/01/2019   CO2 24 02/01/2019   GLUCOSE 131 (H) 08/26/2016   BUN 19 02/01/2019   CREATININE 0.64 02/01/2019   BILITOT 0.4 02/01/2019   ALKPHOS 69 02/01/2019   AST 16 02/01/2019   ALT 17 02/01/2019   PROT 6.6 02/01/2019   ALBUMIN 4.5 02/01/2019   CALCIUM 9.8 02/01/2019   ANIONGAP 8 10/25/2015   Lab Results  Component Value Date   CHOL 207 02/01/2019   Lab Results  Component Value Date   HDL 44 02/01/2019   Lab Results  Component Value Date   LDLCALC 135 02/01/2019   Lab Results  Component Value Date   TRIG 146 02/01/2019   Lab Results  Component Value Date   CHOLHDL 4.7 02/01/2019   Lab Results  Component Value Date   HGBA1C 7.3 (A) 02/01/2019      Assessment & Plan:  1. Elevated lipids Crestor-taken daily-not at LDL goal 70 with DM - gemfibrozil (LOPID) 600 MG tablet; Take 1 tablet (600 mg total) by mouth 2 (two) times daily before a meal.  Dispense: 180 tablet; Refill: 0 Lipid panel, cmp 2. Essential hypertension HCTZ/Lopressor-cmp-LE edema 3. Atherosclerosis of native coronary artery of native heart without angina pectoris Nitrostat-no recent use-stent Cardio following 4. Type 2 diabetes mellitus with diabetic neuropathy, without long-term current use  of insulin ([place]) No medication-pt does not want medication 5. Gastroesophageal reflux disease without esophagitis No medication currently Follow-up: primary care physician-6 months-d/w pt can not rx antibiotics for prn use for breast cellulitis-pt does not currently have this concern but has in the past. Previous doctor gave pt refill to use when noted with no exam. Suggested UC if after hours and patient could not wait till the next day. Reviewed records-2017 -ER for  evaluation.  Taria Castrillo Hannah Beat, MD

## 2019-09-05 NOTE — Patient Instructions (Signed)
° ° ° °  If you have lab work done today you will be contacted with your lab results within the next 2 weeks.  If you have not heard from us then please contact us. The fastest way to get your results is to register for My Chart. ° ° °IF you received an x-ray today, you will receive an invoice from [facility]. Please contact [facility] at [phone] with questions or concerns regarding your invoice.  ° °IF you received labwork today, you will receive an invoice from [facility]. Please contact [facility] at [phone] with questions or concerns regarding your invoice.  ° °Our billing staff will not be able to assist you with questions regarding bills from these companies. ° °You will be contacted with the lab results as soon as they are available. The fastest way to get your results is to activate your My Chart account. Instructions are located on the last page of this paperwork. If you have not heard from us regarding the results in 2 weeks, please contact this office. °  ° ° ° °

## 2019-09-11 ENCOUNTER — Other Ambulatory Visit: Payer: Self-pay

## 2019-09-11 DIAGNOSIS — E1140 Type 2 diabetes mellitus with diabetic neuropathy, unspecified: Secondary | ICD-10-CM

## 2019-09-11 MED ORDER — GLUCOSE BLOOD VI STRP: ORAL_STRIP | 1 refills | Status: AC

## 2019-10-30 ENCOUNTER — Ambulatory Visit: Payer: Medicare Other | Admitting: Family Medicine

## 2019-11-06 ENCOUNTER — Ambulatory Visit: Payer: Medicare Other | Admitting: Family Medicine

## 2019-11-19 ENCOUNTER — Other Ambulatory Visit: Payer: Self-pay | Admitting: Family Medicine

## 2019-11-19 ENCOUNTER — Other Ambulatory Visit: Payer: Self-pay | Admitting: Internal Medicine

## 2019-11-19 DIAGNOSIS — Z1231 Encounter for screening mammogram for malignant neoplasm of breast: Secondary | ICD-10-CM

## 2019-11-20 NOTE — Progress Notes (Signed)
Patient ID: [name] [name], female   DOB: 07-Jan-1956, 64 y.o.   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 Previous breast cancer with lumpectomy and adjuvant chemo. Previous smoker with HLD and CAD with stent to LAD in 2004-08-12  History of cardiomegaly. Anginal pain was atypical with shoulder and back pain. Last normal stress test myovue August 2013  Myovue 8/13 normal EF 67%  TTE  06/17/19 reviewed EF 60-65% no CE no significant valve disease  Recovered from left humeral fracture 08-12-12 Fractured left foot 08-12-13   Both parents died in 04-14-17  after falls they were 85/66 yo June 2020  beta blocker and diuretic dose adjusted BP up with stress She since has stopped diuretic  HLD on crestor/gemfibrozil and Omega 3 stopped zetia due to cost   New primary [name] [name] established 05/08/19 Beta blocker changed to bid lopressor 05/28/19   Retired in April but COVID has not made it fun   Enjoying being retired Having a lot of home improvements done Has a brother in [place] 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  All other systems reviewed and negative   General: Filed Weights   11/27/19 1028  Weight: 245 lb 9.6 oz (111.4 kg)  BP 140/76    Pulse 68    Ht 5\' 3"  (1.6 m)    Wt 245 lb 9.6 oz (111.4 kg)    SpO2 96%    BMI 43.51 kg/m   Repeat BP after sitting 135/85 and home readings are normal  Affect appropriate Overweight white female  HEENT: normal Neck supple with no adenopathy JVP normal no bruits no thyromegaly Lungs clear with no wheezing and good diaphragmatic motion Heart:  S1/S2 no murmur, no rub, gallop or click PMI normal Abdomen: benighn, BS  positve, no tenderness, no AAA no bruit.  No HSM or HJR Distal pulses intact with no bruits No edema Neuro non-focal Skin warm and dry No muscular weakness   Medications Current Outpatient Medications  Medication Sig Dispense Refill   Acetaminophen (8 HR ARTHRITIS PAIN RELIEF PO) Take 1 tablet by mouth 2 (two) times daily.     Acetaminophen (TYLENOL PO) Take 650-1,000 mg by mouth daily as needed (pain).     aspirin EC 81 MG tablet Take 81 mg by mouth daily.     BIOTIN 5000 PO Take 5,000 mg by mouth daily.     Cholecalciferol (VITAMIN D) 2000 UNITS CAPS Take 1 capsule by mouth daily.     docusate sodium (COLACE) 100 MG capsule Take 100 mg by mouth daily as needed. constipation     fish oil-omega-3 fatty acids 1000 MG capsule Take 1 g by mouth 2 (two) times daily.      gemfibrozil (LOPID) 600 MG tablet Take 1 tablet (600 mg total) by mouth 2 (two) times daily before a meal. 180 tablet 1   glucose blood test strip Use as instructed 100 each 1   hydrochlorothiazide (HYDRODIURIL) 12.5 MG tablet Take 1 tablet (12.5 mg total) by mouth daily. 90 tablet 3   loratadine (CLARITIN) 10 MG tablet Take 10 mg by mouth daily.     metoprolol tartrate (LOPRESSOR) 50 MG tablet Take 1.5 tablets (  75 mg total) by mouth 2 (two) times daily. 270 tablet 3   Multiple Vitamin (MULTIVITAMIN WITH MINERALS) TABS tablet Take 0.5 tablets by mouth 2 (two) times daily.     nitroGLYCERIN (NITROSTAT) 0.4 MG SL tablet Place 1 tablet (0.4 mg total) under the tongue every 5 (five) minutes as needed for chest pain. 25 tablet 6   rosuvastatin (CRESTOR) 20 MG tablet Take 1 tablet (20 mg total) by mouth at bedtime. 90 tablet 1   Sennosides-Docusate Sodium (SENNA-S PO) Take by mouth daily as needed.     vitamin B-12 (CYANOCOBALAMIN) 500 MCG tablet Take 500 mcg by mouth daily.       No current facility-administered medications for this visit.    Allergies Vicodin [hydrocodone-acetaminophen], Codeine,  Doxycycline, Pentazocine lactate, Percocet [oxycodone-acetaminophen], and Duricef [cefadroxil monohydrate]  Family History: Family History  Problem Relation Age of Onset   Coronary artery disease Unknown    Thrombophlebitis Unknown    Arthritis Unknown    Cancer Unknown    Lung disease Unknown    Asthma Unknown    Diabetes Unknown     Social History: Social History   Socioeconomic History   Marital status: Divorced    Spouse name: Not on file   Number of children: Not on file   Years of education: Not on file   Highest education level: Not on file  Occupational History   Not on file  Tobacco Use   Smoking status: Former Smoker    Packs/day: 1.50    Years: 35.00    Pack years: 52.50    Types: Cigarettes    Quit date: 03/22/2004    Years since quitting: 15.6   Smokeless tobacco: Never Used  Vaping Use   Vaping Use: Never used  Substance and Sexual Activity   Alcohol use: No   Drug use: No   Sexual activity: Yes    Birth control/protection: Surgical  Other Topics Concern   Not on file  Social History Narrative   Not on file   Social Determinants of Health   Financial Resource Strain:    Difficulty of Paying Living Expenses:   Food Insecurity:    Worried About Charity fundraiser in the Last Year:    Arboriculturist in the Last Year:   Transportation Needs:    Film/video editor (Medical):    Lack of Transportation (Non-Medical):   Physical Activity:    Days of Exercise per Week:    Minutes of Exercise per Session:   Stress:    Feeling of Stress :   Social Connections:    Frequency of Communication with Friends and Family:    Frequency of Social Gatherings with Friends and Family:    Attends Religious Services:    Active Member of Clubs or Organizations:    Attends Music therapist:    Marital Status:   Intimate Partner Violence:    Fear of Current or Ex-Partner:    Emotionally Abused:     Physically Abused:    Sexually Abused:     Past Surgical History:  Procedure Laterality Date   APPENDECTOMY     BREAST LUMPECTOMY     left   C/S tubal ligation     CARDIAC CATHETERIZATION     DES to LAD   CHOLECYSTECTOMY     colonoscopy with cold snare and snare cautery polypectomy     CORONARY ANGIOPLASTY  2005   1 stent   CYSTECTOMY  rt. palm   HYSTEROSCOPY WITH D & C  03/28/2012   Procedure: DILATATION AND CURETTAGE /HYSTEROSCOPY;  Surgeon: Florian Buff, MD;  Location: [facility];  Service: Gynecology;  Laterality: N/A;   LYMPHADENECTOMY     stent implant     TONSILECTOMY, ADENOIDECTOMY, BILATERAL MYRINGOTOMY AND TUBES     TONSILLECTOMY     TUBAL LIGATION     with c-section    Past Medical History:  Diagnosis Date   Anserine bursitis    Breast cancer ([place])    left lumpectomy   CAD (coronary artery disease) 2006   DES to LAD   Cardiomegaly    Chest pain    Complication of anesthesia    Diabetes mellitus without complication ([facility])    Edema    legs   GERD (gastroesophageal reflux disease)    HTN (hypertension)    Hyperlipidemia    Kidney stones    Knee pain    MI (myocardial infarction) ([place]) 2005   PONV (postoperative nausea and vomiting)    Pulmonary embolism ([facility])     Electrocardiogram:   10/11/17 SR rate 74 normal 05/28/19 ST rate 113 otherwise normal   Assessment and Plan  CAD:  Stent LAD 2006 Last myvovue reviewed and normal in 2013  No angina continue medical Rx  Chol: labs with primary on statin and zetia LDL 135 and crestor increased to 20 mg daily on 07/29/19 repeat labs pending   Cardiomegaly: body habitus TTE 06/17/19 EF 60-65% normal LV size   HTN:  Improved continue current meds was 128/78 mmHg at Dr Juel Burrow office last week   Breast Cancer yearly mammogram 10 years survival post Rx    F/U in 6 months    Jenkins Rouge

## 2019-11-27 ENCOUNTER — Ambulatory Visit (INDEPENDENT_AMBULATORY_CARE_PROVIDER_SITE_OTHER): Payer: Medicare Other | Admitting: Cardiovascular Disease

## 2019-11-27 ENCOUNTER — Other Ambulatory Visit: Payer: Self-pay

## 2019-11-27 ENCOUNTER — Encounter: Payer: Self-pay | Admitting: Cardiovascular Disease

## 2019-11-27 VITALS — BP 140/76 | HR 68 | Ht 63.0 in | Wt 245.6 lb

## 2019-11-27 DIAGNOSIS — I2510 Atherosclerotic heart disease of native coronary artery without angina pectoris: Secondary | ICD-10-CM | POA: Diagnosis not present

## 2019-11-27 DIAGNOSIS — I10 Essential (primary) hypertension: Secondary | ICD-10-CM | POA: Diagnosis not present

## 2019-11-27 NOTE — Patient Instructions (Signed)
Medication Instructions:  Your physician recommends that you continue on your current medications as directed. Please refer to the Current Medication list given to you today.  *If you need a refill on your cardiac medications before your next appointment, please call your pharmacy*   Lab Work: None today  If you have labs (blood work) drawn today and your tests are completely normal, you will receive your results only by: MyChart Message (if you have MyChart) OR A paper copy in the mail If you have any lab test that is abnormal or we need to change your treatment, we will call you to review the results.   Testing/Procedures: None today    Follow-Up: At [facility], you and your health needs are our priority.  As part of our continuing mission to provide you with exceptional heart care, we have created designated Provider Care Teams.  These Care Teams include your primary Cardiologist (physician) and Advanced Practice Providers (APPs -  Physician Assistants and Nurse Practitioners) who all work together to provide you with the care you need, when you need it.  We recommend signing up for the patient portal called "MyChart".  Sign up information is provided on this After Visit Summary.  MyChart is used to connect with patients for Virtual Visits (Telemedicine).  Patients are able to view lab/test results, encounter notes, upcoming appointments, etc.  Non-urgent messages can be sent to your provider as well.   To learn more about what you can do with MyChart, go to [email or web address].    Your next appointment:   6 month(s)  The format for your next appointment:   In Person  Provider:   Peter Nishan, MD   Other Instructions None   

## 2019-11-29 ENCOUNTER — Ambulatory Visit
Admission: RE | Admit: 2019-11-29 | Discharge: 2019-11-29 | Disposition: A | Discharge status: Home / Self Care | Payer: Medicare Other | Source: Ambulatory Visit | Attending: Internal Medicine | Admitting: Internal Medicine

## 2019-11-29 ENCOUNTER — Other Ambulatory Visit: Payer: Self-pay

## 2019-11-29 DIAGNOSIS — Z1231 Encounter for screening mammogram for malignant neoplasm of breast: Secondary | ICD-10-CM

## 2019-12-03 DIAGNOSIS — E118 Type 2 diabetes mellitus with unspecified complications: Secondary | ICD-10-CM | POA: Diagnosis not present

## 2019-12-03 DIAGNOSIS — E782 Mixed hyperlipidemia: Secondary | ICD-10-CM | POA: Diagnosis not present

## 2019-12-04 DIAGNOSIS — Z1329 Encounter for screening for other suspected endocrine disorder: Secondary | ICD-10-CM | POA: Diagnosis not present

## 2019-12-04 DIAGNOSIS — E539 Vitamin B deficiency, unspecified: Secondary | ICD-10-CM | POA: Diagnosis not present

## 2019-12-04 DIAGNOSIS — E559 Vitamin D deficiency, unspecified: Secondary | ICD-10-CM | POA: Diagnosis not present

## 2019-12-04 DIAGNOSIS — E782 Mixed hyperlipidemia: Secondary | ICD-10-CM | POA: Diagnosis not present

## 2019-12-10 DIAGNOSIS — L0390 Cellulitis, unspecified: Secondary | ICD-10-CM | POA: Diagnosis not present

## 2019-12-10 DIAGNOSIS — J302 Other seasonal allergic rhinitis: Secondary | ICD-10-CM | POA: Diagnosis not present

## 2019-12-10 DIAGNOSIS — E782 Mixed hyperlipidemia: Secondary | ICD-10-CM | POA: Diagnosis not present

## 2019-12-10 DIAGNOSIS — I2510 Atherosclerotic heart disease of native coronary artery without angina pectoris: Secondary | ICD-10-CM | POA: Diagnosis not present

## 2019-12-10 DIAGNOSIS — E539 Vitamin B deficiency, unspecified: Secondary | ICD-10-CM | POA: Diagnosis not present

## 2019-12-10 DIAGNOSIS — E118 Type 2 diabetes mellitus with unspecified complications: Secondary | ICD-10-CM | POA: Diagnosis not present

## 2019-12-10 DIAGNOSIS — M1990 Unspecified osteoarthritis, unspecified site: Secondary | ICD-10-CM | POA: Diagnosis not present

## 2019-12-10 DIAGNOSIS — Z0189 Encounter for other specified special examinations: Secondary | ICD-10-CM | POA: Diagnosis not present

## 2019-12-10 DIAGNOSIS — E559 Vitamin D deficiency, unspecified: Secondary | ICD-10-CM | POA: Diagnosis not present

## 2020-03-28 DIAGNOSIS — Z23 Encounter for immunization: Secondary | ICD-10-CM | POA: Diagnosis not present

## 2020-04-15 DIAGNOSIS — R7303 Prediabetes: Secondary | ICD-10-CM | POA: Diagnosis not present

## 2020-04-15 DIAGNOSIS — E782 Mixed hyperlipidemia: Secondary | ICD-10-CM | POA: Diagnosis not present

## 2020-04-15 DIAGNOSIS — Z712 Person consulting for explanation of examination or test findings: Secondary | ICD-10-CM | POA: Diagnosis not present

## 2020-04-15 DIAGNOSIS — D519 Vitamin B12 deficiency anemia, unspecified: Secondary | ICD-10-CM | POA: Diagnosis not present

## 2020-04-15 DIAGNOSIS — I10 Essential (primary) hypertension: Secondary | ICD-10-CM | POA: Diagnosis not present

## 2020-04-15 DIAGNOSIS — E559 Vitamin D deficiency, unspecified: Secondary | ICD-10-CM | POA: Diagnosis not present

## 2020-04-21 DIAGNOSIS — E782 Mixed hyperlipidemia: Secondary | ICD-10-CM | POA: Diagnosis not present

## 2020-04-21 DIAGNOSIS — E559 Vitamin D deficiency, unspecified: Secondary | ICD-10-CM | POA: Diagnosis not present

## 2020-04-21 DIAGNOSIS — J302 Other seasonal allergic rhinitis: Secondary | ICD-10-CM | POA: Diagnosis not present

## 2020-04-21 DIAGNOSIS — I2510 Atherosclerotic heart disease of native coronary artery without angina pectoris: Secondary | ICD-10-CM | POA: Diagnosis not present

## 2020-04-21 DIAGNOSIS — E539 Vitamin B deficiency, unspecified: Secondary | ICD-10-CM | POA: Diagnosis not present

## 2020-04-21 DIAGNOSIS — R945 Abnormal results of liver function studies: Secondary | ICD-10-CM | POA: Diagnosis not present

## 2020-04-21 DIAGNOSIS — L0390 Cellulitis, unspecified: Secondary | ICD-10-CM | POA: Diagnosis not present

## 2020-04-21 DIAGNOSIS — Z0189 Encounter for other specified special examinations: Secondary | ICD-10-CM | POA: Diagnosis not present

## 2020-04-21 DIAGNOSIS — E118 Type 2 diabetes mellitus with unspecified complications: Secondary | ICD-10-CM | POA: Diagnosis not present

## 2020-04-21 DIAGNOSIS — M1990 Unspecified osteoarthritis, unspecified site: Secondary | ICD-10-CM | POA: Diagnosis not present

## 2020-05-05 ENCOUNTER — Other Ambulatory Visit: Payer: Self-pay | Admitting: Family Medicine

## 2020-05-05 DIAGNOSIS — E785 Hyperlipidemia, unspecified: Secondary | ICD-10-CM

## 2020-05-26 ENCOUNTER — Telehealth: Payer: Self-pay | Admitting: Cardiovascular Disease

## 2020-05-26 DIAGNOSIS — I10 Essential (primary) hypertension: Secondary | ICD-10-CM

## 2020-05-26 NOTE — Telephone Encounter (Signed)
*  STAT* If patient is at the pharmacy, call can be transferred to refill team.   1. Which medications need to be refilled? (please list name of each medication and dose if known) metoprolol tartrate (LOPRESSOR) 50 MG tablet  2. Which pharmacy/location (including street and city if local pharmacy) is medication to be sent to? [facility] on [address] in [place]   3. Do they need a 30 day or 90 day supply? 90 day

## 2020-05-27 ENCOUNTER — Other Ambulatory Visit: Payer: Self-pay

## 2020-05-27 DIAGNOSIS — I10 Essential (primary) hypertension: Secondary | ICD-10-CM

## 2020-05-27 MED ORDER — METOPROLOL TARTRATE 50 MG PO TABS: 75.0000 mg | ORAL_TABLET | Freq: Two times a day (BID) | ORAL | 3 refills | Status: DC

## 2020-05-27 NOTE — Telephone Encounter (Signed)
Refilled lopressor 75 mg bid to [facility]

## 2020-05-27 NOTE — Telephone Encounter (Signed)
Medciation Refill for Metoprolol tartrate approved and sent to [facility] on [address] In [place]

## 2020-06-11 NOTE — Progress Notes (Signed)
Patient ID: [name] [name], female   DOB: 06-Feb-1956, 65 y.o.   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 Previous breast cancer with lumpectomy and adjuvant chemo. Previous smoker with HLD and CAD with stent to LAD in 09/09/2004  Anginal pain was atypical with shoulder and back pain. 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  All other systems reviewed and negative   General: Filed Weights   06/19/20 1325  Weight: 107.5 kg  BP (!) 146/84   Pulse 65   Ht 5' 3.5" (1.613 m)   Wt 107.5 kg   SpO2 97%   BMI 41.32 kg/m   Repeat BP after sitting 135/85 and home readings are normal  Affect appropriate Overweight white female  HEENT: normal Neck supple with no adenopathy JVP normal no bruits no thyromegaly Lungs clear with no wheezing and good diaphragmatic motion Heart:  S1/S2 no murmur, no rub, gallop or click PMI normal Abdomen: benighn, BS positve, no  tenderness, no AAA no bruit.  No HSM or HJR Distal pulses intact with no bruits No edema Neuro non-focal Skin warm and dry No muscular weakness   Medications Current Outpatient Medications  Medication Sig Dispense Refill  . Acetaminophen (8 HR ARTHRITIS PAIN RELIEF PO) Take 1 tablet by mouth 2 (two) times daily.    . Acetaminophen (TYLENOL PO) Take 650-1,000 mg by mouth daily as needed (pain).    Marland Kitchen aspirin EC 81 MG tablet Take 81 mg by mouth daily.    Marland Kitchen BIOTIN 5000 PO Take 5,000 mg by mouth daily.    . Cholecalciferol (VITAMIN D) 2000 UNITS CAPS Take 1 capsule by mouth daily.    Marland Kitchen docusate sodium (COLACE) 100 MG capsule Take 100 mg by mouth daily as needed. constipation    . fish oil-omega-3 fatty acids 1000 MG capsule Take 1 g by mouth 2 (two) times daily.    Marland Kitchen gemfibrozil (LOPID) 600 MG tablet Take 600 mg by mouth 2 (two) times daily before a meal.    . glucose blood test strip Use as instructed 100 each 1  . hydrochlorothiazide (HYDRODIURIL) 12.5 MG tablet Take 1 tablet (12.5 mg total) by mouth daily. 90 tablet 3  . loratadine (CLARITIN) 10 MG tablet Take 10 mg by mouth daily.    . metoprolol tartrate (LOPRESSOR) 50 MG tablet Take 1.5 tablets (75 mg total)  by mouth 2 (two) times daily. 270 tablet 3  . Multiple Vitamin (MULTIVITAMIN WITH MINERALS) TABS tablet Take 0.5 tablets by mouth 2 (two) times daily.    . nitroGLYCERIN (NITROSTAT) 0.4 MG SL tablet Place 1 tablet (0.4 mg total) under the tongue every 5 (five) minutes as needed for chest pain. 25 tablet 6  . rosuvastatin (CRESTOR) 20 MG tablet Take 1 tablet (20 mg total) by mouth at bedtime. 90 tablet 1  . Sennosides-Docusate Sodium (SENNA-S PO) Take by mouth daily as needed.    . vitamin B-12 (CYANOCOBALAMIN) 500 MCG tablet Take 500 mcg by mouth daily.     No current facility-administered medications for this visit.    Allergies Vicodin [hydrocodone-acetaminophen], Codeine, Doxycycline, Pentazocine lactate, Percocet  [oxycodone-acetaminophen], and Duricef [cefadroxil monohydrate]  Family History: Family History  Problem Relation Age of Onset  . Coronary artery disease Other   . Thrombophlebitis Other   . Arthritis Other   . Cancer Other   . Lung disease Other   . Asthma Other   . Diabetes Other     Social History: Social History   Socioeconomic History  . Marital status: Divorced    Spouse name: Not on file  . Number of children: Not on file  . Years of education: Not on file  . Highest education level: Not on file  Occupational History  . Not on file  Tobacco Use  . Smoking status: Former Smoker    Packs/day: 1.50    Years: 35.00    Pack years: 52.50    Types: Cigarettes    Quit date: 03/22/2004    Years since quitting: 16.2  . Smokeless tobacco: Never Used  Vaping Use  . Vaping Use: Never used  Substance and Sexual Activity  . Alcohol use: No  . Drug use: No  . Sexual activity: Yes    Birth control/protection: Surgical  Other Topics Concern  . Not on file  Social History Narrative  . Not on file   Social Determinants of Health   Financial Resource Strain: Not on file  Food Insecurity: Not on file  Transportation Needs: Not on file  Physical Activity: Not on file  Stress: Not on file  Social Connections: Not on file  Intimate Partner Violence: Not on file    Past Surgical History:  Procedure Laterality Date  . APPENDECTOMY    . BREAST LUMPECTOMY     left  . C/S tubal ligation    . CARDIAC CATHETERIZATION     DES to LAD  . CHOLECYSTECTOMY    . colonoscopy with cold snare and snare cautery polypectomy    . CORONARY ANGIOPLASTY  2005   1 stent  . CYSTECTOMY     rt. palm  . HYSTEROSCOPY WITH D & C  03/28/2012   Procedure: DILATATION AND CURETTAGE /HYSTEROSCOPY;  Surgeon: Florian Buff, MD;  Location: [facility];  Service: Gynecology;  Laterality: N/A;  . LYMPHADENECTOMY    . stent implant    . TONSILECTOMY, ADENOIDECTOMY, BILATERAL MYRINGOTOMY AND TUBES    .  TONSILLECTOMY    . TUBAL LIGATION     with c-section    Past Medical History:  Diagnosis Date  . Anserine bursitis   . Breast cancer ([facility])    left lumpectomy  . CAD (coronary artery disease) 2006   DES to LAD  . Cardiomegaly   . Chest pain   . Complication of anesthesia   . Diabetes mellitus without complication ([facility])   . Edema  legs  . GERD (gastroesophageal reflux disease)   . HTN (hypertension)   . Hyperlipidemia   . Kidney stones   . Knee pain   . MI (myocardial infarction) ([facility]) 2005  . PONV (postoperative nausea and vomiting)   . Pulmonary embolism ([facility])     Electrocardiogram:   10/11/17 SR rate 74 normal 05/28/19 ST rate 113 otherwise normal   Assessment and Plan  CAD:  Stent LAD 2006 Last myvovue reviewed and normal in 2013  No angina continue medical Rx    Chol: labs with primary on statin, lopid and fish oil.  LDL 135 and crestor increased to 20 mg daily on 07/29/19   And placed back on zetia with fibrates F/u labs April with primary   Cardiomegaly: body habitus TTE 06/17/19 EF 60-65% normal LV size   HTN:  Improved continue current meds was 128/78 mmHg at Dr Juel Burrow office last week   Breast Cancer yearly mammogram 10 years survival post Rx    F/U in 6 months    Jenkins Rouge

## 2020-06-19 ENCOUNTER — Other Ambulatory Visit: Payer: Self-pay

## 2020-06-19 ENCOUNTER — Encounter: Payer: Self-pay | Admitting: Cardiovascular Disease

## 2020-06-19 ENCOUNTER — Ambulatory Visit (INDEPENDENT_AMBULATORY_CARE_PROVIDER_SITE_OTHER): Payer: Medicare Other | Admitting: Cardiovascular Disease

## 2020-06-19 VITALS — BP 146/84 | HR 65 | Ht 63.5 in | Wt 237.0 lb

## 2020-06-19 DIAGNOSIS — E782 Mixed hyperlipidemia: Secondary | ICD-10-CM

## 2020-06-19 DIAGNOSIS — I10 Essential (primary) hypertension: Secondary | ICD-10-CM

## 2020-06-19 DIAGNOSIS — I2510 Atherosclerotic heart disease of native coronary artery without angina pectoris: Secondary | ICD-10-CM

## 2020-06-19 NOTE — Patient Instructions (Signed)
Medication Instructions:  Your physician recommends that you continue on your current medications as directed. Please refer to the Current Medication list given to you today.  *If you need a refill on your cardiac medications before your next appointment, please call your pharmacy*   Lab Work: NONE   If you have labs (blood work) drawn today and your tests are completely normal, you will receive your results only by: . MyChart Message (if you have MyChart) OR . A paper copy in the mail If you have any lab test that is abnormal or we need to change your treatment, we will call you to review the results.   Testing/Procedures: NONE    Follow-Up: At [facility], you and your health needs are our priority.  As part of our continuing mission to provide you with exceptional heart care, we have created designated Provider Care Teams.  These Care Teams include your primary Cardiologist (physician) and Advanced Practice Providers (APPs -  Physician Assistants and Nurse Practitioners) who all work together to provide you with the care you need, when you need it.  We recommend signing up for the patient portal called "MyChart".  Sign up information is provided on this After Visit Summary.  MyChart is used to connect with patients for Virtual Visits (Telemedicine).  Patients are able to view lab/test results, encounter notes, upcoming appointments, etc.  Non-urgent messages can be sent to your provider as well.   To learn more about what you can do with MyChart, go to [email or web address].    Your next appointment:   1 year(s)  The format for your next appointment:   In Person  Provider:   Peter Nishan, MD   Other Instructions Thank you for choosing [facility]!    

## 2020-08-14 DIAGNOSIS — E539 Vitamin B deficiency, unspecified: Secondary | ICD-10-CM | POA: Diagnosis not present

## 2020-08-14 DIAGNOSIS — M1990 Unspecified osteoarthritis, unspecified site: Secondary | ICD-10-CM | POA: Diagnosis not present

## 2020-08-14 DIAGNOSIS — E559 Vitamin D deficiency, unspecified: Secondary | ICD-10-CM | POA: Diagnosis not present

## 2020-08-14 DIAGNOSIS — E118 Type 2 diabetes mellitus with unspecified complications: Secondary | ICD-10-CM | POA: Diagnosis not present

## 2020-08-14 DIAGNOSIS — E782 Mixed hyperlipidemia: Secondary | ICD-10-CM | POA: Diagnosis not present

## 2020-08-18 DIAGNOSIS — R059 Cough, unspecified: Secondary | ICD-10-CM | POA: Diagnosis not present

## 2020-08-18 DIAGNOSIS — E559 Vitamin D deficiency, unspecified: Secondary | ICD-10-CM | POA: Diagnosis not present

## 2020-08-18 DIAGNOSIS — L0390 Cellulitis, unspecified: Secondary | ICD-10-CM | POA: Diagnosis not present

## 2020-08-18 DIAGNOSIS — E782 Mixed hyperlipidemia: Secondary | ICD-10-CM | POA: Diagnosis not present

## 2020-08-18 DIAGNOSIS — J302 Other seasonal allergic rhinitis: Secondary | ICD-10-CM | POA: Diagnosis not present

## 2020-08-18 DIAGNOSIS — Z0189 Encounter for other specified special examinations: Secondary | ICD-10-CM | POA: Diagnosis not present

## 2020-08-18 DIAGNOSIS — E539 Vitamin B deficiency, unspecified: Secondary | ICD-10-CM | POA: Diagnosis not present

## 2020-08-18 DIAGNOSIS — I2510 Atherosclerotic heart disease of native coronary artery without angina pectoris: Secondary | ICD-10-CM | POA: Diagnosis not present

## 2020-08-18 DIAGNOSIS — E118 Type 2 diabetes mellitus with unspecified complications: Secondary | ICD-10-CM | POA: Diagnosis not present

## 2020-08-18 DIAGNOSIS — R945 Abnormal results of liver function studies: Secondary | ICD-10-CM | POA: Diagnosis not present

## 2020-08-18 DIAGNOSIS — M1990 Unspecified osteoarthritis, unspecified site: Secondary | ICD-10-CM | POA: Diagnosis not present

## 2021-02-18 DIAGNOSIS — E782 Mixed hyperlipidemia: Secondary | ICD-10-CM | POA: Diagnosis not present

## 2021-02-18 DIAGNOSIS — E118 Type 2 diabetes mellitus with unspecified complications: Secondary | ICD-10-CM | POA: Diagnosis not present

## 2021-02-18 DIAGNOSIS — Z0000 Encounter for general adult medical examination without abnormal findings: Secondary | ICD-10-CM | POA: Diagnosis not present

## 2021-02-23 DIAGNOSIS — Z23 Encounter for immunization: Secondary | ICD-10-CM | POA: Diagnosis not present

## 2021-02-23 DIAGNOSIS — I2510 Atherosclerotic heart disease of native coronary artery without angina pectoris: Secondary | ICD-10-CM | POA: Diagnosis not present

## 2021-02-23 DIAGNOSIS — E559 Vitamin D deficiency, unspecified: Secondary | ICD-10-CM | POA: Diagnosis not present

## 2021-02-23 DIAGNOSIS — J302 Other seasonal allergic rhinitis: Secondary | ICD-10-CM | POA: Diagnosis not present

## 2021-02-23 DIAGNOSIS — Z0001 Encounter for general adult medical examination with abnormal findings: Secondary | ICD-10-CM | POA: Diagnosis not present

## 2021-02-23 DIAGNOSIS — E1165 Type 2 diabetes mellitus with hyperglycemia: Secondary | ICD-10-CM | POA: Diagnosis not present

## 2021-02-23 DIAGNOSIS — E539 Vitamin B deficiency, unspecified: Secondary | ICD-10-CM | POA: Diagnosis not present

## 2021-02-23 DIAGNOSIS — R059 Cough, unspecified: Secondary | ICD-10-CM | POA: Diagnosis not present

## 2021-02-23 DIAGNOSIS — M1990 Unspecified osteoarthritis, unspecified site: Secondary | ICD-10-CM | POA: Diagnosis not present

## 2021-02-23 DIAGNOSIS — E782 Mixed hyperlipidemia: Secondary | ICD-10-CM | POA: Diagnosis not present

## 2021-02-23 DIAGNOSIS — I10 Essential (primary) hypertension: Secondary | ICD-10-CM | POA: Diagnosis not present

## 2021-05-22 ENCOUNTER — Other Ambulatory Visit: Payer: Self-pay | Admitting: Cardiovascular Disease

## 2021-05-22 DIAGNOSIS — I10 Essential (primary) hypertension: Secondary | ICD-10-CM

## 2021-08-18 ENCOUNTER — Other Ambulatory Visit: Payer: Self-pay | Admitting: Cardiovascular Disease

## 2021-08-18 DIAGNOSIS — I10 Essential (primary) hypertension: Secondary | ICD-10-CM

## 2021-08-20 DIAGNOSIS — E559 Vitamin D deficiency, unspecified: Secondary | ICD-10-CM | POA: Diagnosis not present

## 2021-08-20 DIAGNOSIS — E1165 Type 2 diabetes mellitus with hyperglycemia: Secondary | ICD-10-CM | POA: Diagnosis not present

## 2021-08-20 DIAGNOSIS — E539 Vitamin B deficiency, unspecified: Secondary | ICD-10-CM | POA: Diagnosis not present

## 2021-08-20 DIAGNOSIS — Z79899 Other long term (current) drug therapy: Secondary | ICD-10-CM | POA: Diagnosis not present

## 2021-08-20 DIAGNOSIS — I10 Essential (primary) hypertension: Secondary | ICD-10-CM | POA: Diagnosis not present

## 2021-08-25 DIAGNOSIS — I10 Essential (primary) hypertension: Secondary | ICD-10-CM | POA: Diagnosis not present

## 2021-08-25 DIAGNOSIS — J302 Other seasonal allergic rhinitis: Secondary | ICD-10-CM | POA: Diagnosis not present

## 2021-08-25 DIAGNOSIS — M1990 Unspecified osteoarthritis, unspecified site: Secondary | ICD-10-CM | POA: Diagnosis not present

## 2021-08-25 DIAGNOSIS — E1165 Type 2 diabetes mellitus with hyperglycemia: Secondary | ICD-10-CM | POA: Diagnosis not present

## 2021-08-25 DIAGNOSIS — E539 Vitamin B deficiency, unspecified: Secondary | ICD-10-CM | POA: Diagnosis not present

## 2021-08-25 DIAGNOSIS — I2510 Atherosclerotic heart disease of native coronary artery without angina pectoris: Secondary | ICD-10-CM | POA: Diagnosis not present

## 2021-08-25 DIAGNOSIS — K029 Dental caries, unspecified: Secondary | ICD-10-CM | POA: Diagnosis not present

## 2021-08-25 DIAGNOSIS — E559 Vitamin D deficiency, unspecified: Secondary | ICD-10-CM | POA: Diagnosis not present

## 2021-08-25 DIAGNOSIS — E782 Mixed hyperlipidemia: Secondary | ICD-10-CM | POA: Diagnosis not present

## 2022-02-17 DIAGNOSIS — I10 Essential (primary) hypertension: Secondary | ICD-10-CM | POA: Diagnosis not present

## 2022-02-17 DIAGNOSIS — E1165 Type 2 diabetes mellitus with hyperglycemia: Secondary | ICD-10-CM | POA: Diagnosis not present

## 2022-02-17 DIAGNOSIS — E782 Mixed hyperlipidemia: Secondary | ICD-10-CM | POA: Diagnosis not present

## 2022-02-24 DIAGNOSIS — E559 Vitamin D deficiency, unspecified: Secondary | ICD-10-CM | POA: Diagnosis not present

## 2022-02-24 DIAGNOSIS — E782 Mixed hyperlipidemia: Secondary | ICD-10-CM | POA: Diagnosis not present

## 2022-02-24 DIAGNOSIS — E539 Vitamin B deficiency, unspecified: Secondary | ICD-10-CM | POA: Diagnosis not present

## 2022-02-24 DIAGNOSIS — I10 Essential (primary) hypertension: Secondary | ICD-10-CM | POA: Diagnosis not present

## 2022-02-24 DIAGNOSIS — I2510 Atherosclerotic heart disease of native coronary artery without angina pectoris: Secondary | ICD-10-CM | POA: Diagnosis not present

## 2022-02-24 DIAGNOSIS — M1990 Unspecified osteoarthritis, unspecified site: Secondary | ICD-10-CM | POA: Diagnosis not present

## 2022-02-24 DIAGNOSIS — E1165 Type 2 diabetes mellitus with hyperglycemia: Secondary | ICD-10-CM | POA: Diagnosis not present

## 2022-02-24 DIAGNOSIS — J302 Other seasonal allergic rhinitis: Secondary | ICD-10-CM | POA: Diagnosis not present

## 2022-06-21 DIAGNOSIS — E559 Vitamin D deficiency, unspecified: Secondary | ICD-10-CM | POA: Diagnosis not present

## 2022-06-21 DIAGNOSIS — E1165 Type 2 diabetes mellitus with hyperglycemia: Secondary | ICD-10-CM | POA: Diagnosis not present

## 2022-06-21 DIAGNOSIS — I10 Essential (primary) hypertension: Secondary | ICD-10-CM | POA: Diagnosis not present

## 2022-06-21 DIAGNOSIS — E782 Mixed hyperlipidemia: Secondary | ICD-10-CM | POA: Diagnosis not present

## 2022-06-21 DIAGNOSIS — E539 Vitamin B deficiency, unspecified: Secondary | ICD-10-CM | POA: Diagnosis not present

## 2022-06-27 ENCOUNTER — Other Ambulatory Visit (HOSPITAL_COMMUNITY): Payer: Self-pay | Admitting: Family Medicine

## 2022-06-27 DIAGNOSIS — E559 Vitamin D deficiency, unspecified: Secondary | ICD-10-CM | POA: Diagnosis not present

## 2022-06-27 DIAGNOSIS — Z1231 Encounter for screening mammogram for malignant neoplasm of breast: Secondary | ICD-10-CM

## 2022-06-27 DIAGNOSIS — E1165 Type 2 diabetes mellitus with hyperglycemia: Secondary | ICD-10-CM | POA: Diagnosis not present

## 2022-06-27 DIAGNOSIS — E782 Mixed hyperlipidemia: Secondary | ICD-10-CM | POA: Diagnosis not present

## 2022-06-27 DIAGNOSIS — J302 Other seasonal allergic rhinitis: Secondary | ICD-10-CM | POA: Diagnosis not present

## 2022-06-27 DIAGNOSIS — R809 Proteinuria, unspecified: Secondary | ICD-10-CM | POA: Diagnosis not present

## 2022-06-27 DIAGNOSIS — M1990 Unspecified osteoarthritis, unspecified site: Secondary | ICD-10-CM | POA: Diagnosis not present

## 2022-06-27 DIAGNOSIS — E539 Vitamin B deficiency, unspecified: Secondary | ICD-10-CM | POA: Diagnosis not present

## 2022-06-27 DIAGNOSIS — I10 Essential (primary) hypertension: Secondary | ICD-10-CM | POA: Diagnosis not present

## 2022-06-27 DIAGNOSIS — I2510 Atherosclerotic heart disease of native coronary artery without angina pectoris: Secondary | ICD-10-CM | POA: Diagnosis not present

## 2022-08-29 ENCOUNTER — Other Ambulatory Visit: Payer: Self-pay | Admitting: Cardiovascular Disease

## 2022-08-29 DIAGNOSIS — I10 Essential (primary) hypertension: Secondary | ICD-10-CM

## 2022-08-30 ENCOUNTER — Other Ambulatory Visit: Payer: Self-pay | Admitting: Cardiovascular Disease

## 2022-08-30 DIAGNOSIS — I10 Essential (primary) hypertension: Secondary | ICD-10-CM

## 2022-09-28 ENCOUNTER — Other Ambulatory Visit: Payer: Self-pay | Admitting: Cardiovascular Disease

## 2022-09-28 DIAGNOSIS — I10 Essential (primary) hypertension: Secondary | ICD-10-CM

## 2022-09-28 NOTE — Telephone Encounter (Signed)
Pt will need to keep upcoming appt for future refills.

## 2022-10-11 ENCOUNTER — Other Ambulatory Visit: Payer: Self-pay | Admitting: Cardiovascular Disease

## 2022-10-11 DIAGNOSIS — I10 Essential (primary) hypertension: Secondary | ICD-10-CM

## 2022-10-20 DIAGNOSIS — E1165 Type 2 diabetes mellitus with hyperglycemia: Secondary | ICD-10-CM | POA: Diagnosis not present

## 2022-10-20 DIAGNOSIS — E539 Vitamin B deficiency, unspecified: Secondary | ICD-10-CM | POA: Diagnosis not present

## 2022-10-20 DIAGNOSIS — I10 Essential (primary) hypertension: Secondary | ICD-10-CM | POA: Diagnosis not present

## 2022-10-20 DIAGNOSIS — E782 Mixed hyperlipidemia: Secondary | ICD-10-CM | POA: Diagnosis not present

## 2022-10-20 DIAGNOSIS — E559 Vitamin D deficiency, unspecified: Secondary | ICD-10-CM | POA: Diagnosis not present

## 2022-10-21 NOTE — Progress Notes (Signed)
Cardiology Office Note:  .   Date:  11/01/2022  ID:  [name] [name], DOB 01-23-1956, MRN [id number] PCP: Benita Stabile, MD  [facility] Providers Cardiologist:  Charlton Haws, MD    History of Present Illness: .   [name] [name] is a 67 y.o. female with CAD stent LAD 2006, normal NST 2013, echo 2021 normal LVEF 60-65% no valve disease, breast CA, HTN, HLD.  Patient comes in after a 2 yr hiatus. No chest pain, dyspnea, dizziness, palpitations. Walks 20-30 min 2-3 times/week. Labs reviewed under [facility] A1C 8.3 trig 162, LDL 71. BP up today. Says it's because she was rushing. Her metoprolol wasn't called in correctly and she's only on 50 mg bid instead of 75 mg bid. Getting extra salt with frozen dinners. Doesn't want to take meds for diabetes.  ROS:    Studies Reviewed: Marland Kitchen    EKG:  NSR with LVH and IRBBB-new.   Echo 06/2019 IMPRESSIONS     1. Left ventricular ejection fraction, by estimation, is 60 to 65%. The  left ventricle has normal function. Left ventricular diastolic parameters  are indeterminate.   2. Right ventricular systolic function is normal. There is normal  pulmonary artery systolic pressure. The estimated right ventricular  systolic pressure is 29.5 mmHg.   3. Left atrial size was upper normal.   4. The aortic valve is tricuspid. Miidly sclerotic aortic valve.   FINDINGS   Left Ventricle: Left ventricular ejection fraction, by estimation, is 60  to 65%. The left ventricle has normal function. The left ventricle has no  regional wall motion abnormalities. The left ventricular internal cavity  size was normal in size. There is   borderline left ventricular hypertrophy.   Right Ventricle: The right ventricular size is normal. No increase in  right ventricular wall thickness. Right ventricular systolic function is  normal. There is normal pulmonary artery systolic pressure. The tricuspid  regurgitant velocity is 2.32 m/s, and   with an assumed right atrial pressure  of 8 mmHg, the estimated right  ventricular systolic pressure is 29.5 mmHg.   Left Atrium: Left atrial size was upper normal.   Right Atrium: Right atrial size was normal in size.   Pericardium: There is no evidence of pericardial effusion. Presence of  pericardial fat pad.   Mitral Valve: The mitral valve is grossly normal. Trivial mitral valve  regurgitation.   Tricuspid Valve: The tricuspid valve is grossly normal. Tricuspid valve  regurgitation is mild.   Aortic Valve: The aortic valve is tricuspid. Aortic valve regurgitation is  not visualized. Mild aortic valve sclerosis is present, with no evidence  of aortic valve stenosis. Mild aortic valve annular calcification. There  is mild calcification of the  aortic valve.   Pulmonic Valve: The pulmonic valve was grossly normal. Pulmonic valve  regurgitation is not visualized.   Aorta: The aortic root is normal in size and structure.   Venous: The inferior vena cava is dilated in size with greater than 50%  respiratory variability, suggesting right atrial pressure of 8 mmHg.   IAS/Shunts: The interatrial septum appears to be lipomatous. No atrial  level shunt detected by color flow Doppler.     Risk Assessment/Calculations:     HYPERTENSION CONTROL Vitals:   11/01/22 1116 11/01/22 1151  BP: (!) 144/92 (!) 148/96    The patient's blood pressure is elevated above target today.  In order to address the patient's elevated BP: Blood pressure will be monitored at home to  determine if medication changes need to be made.; A current anti-hypertensive medication was adjusted today.; Follow up with general cardiology has been recommended.          Physical Exam:   VS:  BP (!) 148/96   Pulse 81   Ht 5\' 3"  (1.6 m)   Wt 231 lb 6.4 oz (105 kg)   SpO2 97%   BMI 40.99 kg/m    Wt Readings from Last 3 Encounters:  11/01/22 231 lb 6.4 oz (105 kg)  06/19/20 237 lb (107.5 kg)  11/27/19 245 lb 9.6 oz (111.4 kg)    GEN: Obese, in  no acute distress NECK: No JVD; No carotid bruits CARDIAC:  RRR, 1/6 systolic murmur LSB RESPIRATORY:  Clear to auscultation without rales, wheezing or rhonchi  ABDOMEN: Soft, non-tender, non-distended EXTREMITIES:  No edema; No deformity   ASSESSMENT AND PLAN: .   CAD:  Stent LAD 2006 Last Myoview reviewed and normal in 2013  No angina continue medical Rx     HLD: labs with primary on statin, lopid and fish oil.  LDL 71 and  trig 152    Cardiomegaly: body habitus TTE 06/17/19 EF 60-65% normal LV size    HTN: BP high. Metoprolol filled at wrong dose. Will increase to 75 mg bid. 2 gm sodium diet. Bring readings in 2 weeks. No longer on hydrochlorothiazide  DM-A1C 8.3-doesn't want to take meds. Long discussion with her about treating DM. Will copy PCP.  Obesity: exercise and weight loss discussed. 150 min exercise weekly.         Dispo: F/U 6 months  Signed, Jacolyn Reedy, PA-C

## 2022-10-26 ENCOUNTER — Other Ambulatory Visit: Payer: Self-pay | Admitting: Cardiovascular Disease

## 2022-10-26 DIAGNOSIS — I10 Essential (primary) hypertension: Secondary | ICD-10-CM

## 2022-10-27 DIAGNOSIS — M1990 Unspecified osteoarthritis, unspecified site: Secondary | ICD-10-CM | POA: Diagnosis not present

## 2022-10-27 DIAGNOSIS — E782 Mixed hyperlipidemia: Secondary | ICD-10-CM | POA: Diagnosis not present

## 2022-10-27 DIAGNOSIS — E539 Vitamin B deficiency, unspecified: Secondary | ICD-10-CM | POA: Diagnosis not present

## 2022-10-27 DIAGNOSIS — E1165 Type 2 diabetes mellitus with hyperglycemia: Secondary | ICD-10-CM | POA: Diagnosis not present

## 2022-10-27 DIAGNOSIS — E559 Vitamin D deficiency, unspecified: Secondary | ICD-10-CM | POA: Diagnosis not present

## 2022-10-27 DIAGNOSIS — Z79899 Other long term (current) drug therapy: Secondary | ICD-10-CM | POA: Diagnosis not present

## 2022-10-27 DIAGNOSIS — R809 Proteinuria, unspecified: Secondary | ICD-10-CM | POA: Diagnosis not present

## 2022-10-27 DIAGNOSIS — I2510 Atherosclerotic heart disease of native coronary artery without angina pectoris: Secondary | ICD-10-CM | POA: Diagnosis not present

## 2022-10-27 DIAGNOSIS — I10 Essential (primary) hypertension: Secondary | ICD-10-CM | POA: Diagnosis not present

## 2022-10-27 DIAGNOSIS — H04129 Dry eye syndrome of unspecified lacrimal gland: Secondary | ICD-10-CM | POA: Diagnosis not present

## 2022-11-01 ENCOUNTER — Encounter: Payer: Self-pay | Admitting: Physician Assistant

## 2022-11-01 ENCOUNTER — Ambulatory Visit: Payer: Medicare Other | Attending: Physician Assistant | Admitting: Physician Assistant

## 2022-11-01 VITALS — BP 148/96 | HR 81 | Ht 63.0 in | Wt 231.4 lb

## 2022-11-01 DIAGNOSIS — I517 Cardiomegaly: Secondary | ICD-10-CM | POA: Diagnosis not present

## 2022-11-01 DIAGNOSIS — E7849 Other hyperlipidemia: Secondary | ICD-10-CM | POA: Diagnosis not present

## 2022-11-01 DIAGNOSIS — E1140 Type 2 diabetes mellitus with diabetic neuropathy, unspecified: Secondary | ICD-10-CM | POA: Diagnosis not present

## 2022-11-01 DIAGNOSIS — I2510 Atherosclerotic heart disease of native coronary artery without angina pectoris: Secondary | ICD-10-CM | POA: Diagnosis not present

## 2022-11-01 DIAGNOSIS — I10 Essential (primary) hypertension: Secondary | ICD-10-CM | POA: Insufficient documentation

## 2022-11-01 MED ORDER — METOPROLOL TARTRATE 50 MG PO TABS: 75.0000 mg | ORAL_TABLET | Freq: Two times a day (BID) | ORAL | 3 refills | Status: DC

## 2022-11-01 NOTE — Patient Instructions (Signed)
Medication Instructions:  Your physician recommends that you continue on your current medications as directed. Please refer to the Current Medication list given to you today.  *If you need a refill on your cardiac medications before your next appointment, please call your pharmacy*   Lab Work: None If you have labs (blood work) drawn today and your tests are completely normal, you will receive your results only by: MyChart Message (if you have MyChart) OR A paper copy in the mail If you have any lab test that is abnormal or we need to change your treatment, we will call you to review the results.   Testing/Procedures: None   Follow-Up: At [facility], you and your health needs are our priority.  As part of our continuing mission to provide you with exceptional heart care, we have created designated Provider Care Teams.  These Care Teams include your primary Cardiologist (physician) and Advanced Practice Providers (APPs -  Physician Assistants and Nurse Practitioners) who all work together to provide you with the care you need, when you need it.  We recommend signing up for the patient portal called "MyChart".  Sign up information is provided on this After Visit Summary.  MyChart is used to connect with patients for Virtual Visits (Telemedicine).  Patients are able to view lab/test results, encounter notes, upcoming appointments, etc.  Non-urgent messages can be sent to your provider as well.   To learn more about what you can do with MyChart, go to [email or web address].    Your next appointment:   6 month(s)  Provider:   Charlton Haws, MD    Other Instructions Your provider recommends 150 minutes of exercise per week.

## 2022-11-09 ENCOUNTER — Other Ambulatory Visit: Payer: Self-pay | Admitting: Cardiovascular Disease

## 2022-11-09 DIAGNOSIS — I10 Essential (primary) hypertension: Secondary | ICD-10-CM

## 2022-11-28 ENCOUNTER — Encounter: Payer: Self-pay | Admitting: Cardiovascular Disease

## 2022-12-02 ENCOUNTER — Telehealth: Payer: Self-pay | Admitting: Cardiovascular Disease

## 2022-12-02 NOTE — Telephone Encounter (Signed)
From: Wendall Stade, MD  Sent: 11/28/2022   4:06 PM EDT  To: Ethelda Chick, RN; Seymour Bars   Overall BP in reasonable range   Pt notified and thankful for the call

## 2022-12-02 NOTE — Telephone Encounter (Signed)
Patient states she is returning call and would like a call back.

## 2023-02-22 DIAGNOSIS — E1165 Type 2 diabetes mellitus with hyperglycemia: Secondary | ICD-10-CM | POA: Diagnosis not present

## 2023-02-22 DIAGNOSIS — E782 Mixed hyperlipidemia: Secondary | ICD-10-CM | POA: Diagnosis not present

## 2023-02-28 DIAGNOSIS — E782 Mixed hyperlipidemia: Secondary | ICD-10-CM | POA: Diagnosis not present

## 2023-02-28 DIAGNOSIS — M1990 Unspecified osteoarthritis, unspecified site: Secondary | ICD-10-CM | POA: Diagnosis not present

## 2023-02-28 DIAGNOSIS — I2510 Atherosclerotic heart disease of native coronary artery without angina pectoris: Secondary | ICD-10-CM | POA: Diagnosis not present

## 2023-02-28 DIAGNOSIS — E559 Vitamin D deficiency, unspecified: Secondary | ICD-10-CM | POA: Diagnosis not present

## 2023-02-28 DIAGNOSIS — E539 Vitamin B deficiency, unspecified: Secondary | ICD-10-CM | POA: Diagnosis not present

## 2023-02-28 DIAGNOSIS — H04123 Dry eye syndrome of bilateral lacrimal glands: Secondary | ICD-10-CM | POA: Diagnosis not present

## 2023-02-28 DIAGNOSIS — Z23 Encounter for immunization: Secondary | ICD-10-CM | POA: Diagnosis not present

## 2023-02-28 DIAGNOSIS — E1165 Type 2 diabetes mellitus with hyperglycemia: Secondary | ICD-10-CM | POA: Diagnosis not present

## 2023-02-28 DIAGNOSIS — I10 Essential (primary) hypertension: Secondary | ICD-10-CM | POA: Diagnosis not present

## 2023-02-28 DIAGNOSIS — R809 Proteinuria, unspecified: Secondary | ICD-10-CM | POA: Diagnosis not present

## 2023-05-05 NOTE — Progress Notes (Deleted)
Cardiology Office Note:  .   Date:  05/05/2023  ID:  [name] [name], DOB 06-Jun-1955, MRN [id number] PCP: Benita Stabile, MD  [facility] Providers Cardiologist:  Charlton Haws, MD     History of Present Illness: .   [name] [name] is a 67 y.o. female with CAD stent LAD 2006, normal NST 2013, echo 2021 normal LVEF 60-65% no valve disease, breast CA, HTN, HLD.  Seen by PA 11/01/22 after 2 year hiatus  No chest pain, dyspnea, dizziness, palpitations. Walks 20-30 min 2-3 times/week. Labs reviewed under [facility] A1C 8.3 trig 162, LDL 71. BP up today. Says it's because she was rushing. Her metoprolol wasn't called in correctly and she's only on 50 mg bid instead of 75 mg bid. Getting extra salt with frozen dinners. Doesn't want to take meds for diabetes.  Discussed prudence of getting Myovue to make sure she has not had progressive CAD   ***    ROS:    Studies Reviewed: Marland Kitchen    EKG:  NSR with LVH and IRBBB-new.   Echo 06/2019 IMPRESSIONS     1. Left ventricular ejection fraction, by estimation, is 60 to 65%. The  left ventricle has normal function. Left ventricular diastolic parameters  are indeterminate.   2. Right ventricular systolic function is normal. There is normal  pulmonary artery systolic pressure. The estimated right ventricular  systolic pressure is 29.5 mmHg.   3. Left atrial size was upper normal.   4. The aortic valve is tricuspid. Miidly sclerotic aortic valve.   FINDINGS   Left Ventricle: Left ventricular ejection fraction, by estimation, is 60  to 65%. The left ventricle has normal function. The left ventricle has no  regional wall motion abnormalities. The left ventricular internal cavity  size was normal in size. There is   borderline left ventricular hypertrophy.   Right Ventricle: The right ventricular size is normal. No increase in  right ventricular wall thickness. Right ventricular systolic function is  normal. There is normal pulmonary artery systolic  pressure. The tricuspid  regurgitant velocity is 2.32 m/s, and   with an assumed right atrial pressure of 8 mmHg, the estimated right  ventricular systolic pressure is 29.5 mmHg.   Left Atrium: Left atrial size was upper normal.   Right Atrium: Right atrial size was normal in size.   Pericardium: There is no evidence of pericardial effusion. Presence of  pericardial fat pad.   Mitral Valve: The mitral valve is grossly normal. Trivial mitral valve  regurgitation.   Tricuspid Valve: The tricuspid valve is grossly normal. Tricuspid valve  regurgitation is mild.   Aortic Valve: The aortic valve is tricuspid. Aortic valve regurgitation is  not visualized. Mild aortic valve sclerosis is present, with no evidence  of aortic valve stenosis. Mild aortic valve annular calcification. There  is mild calcification of the  aortic valve.   Pulmonic Valve: The pulmonic valve was grossly normal. Pulmonic valve  regurgitation is not visualized.   Aorta: The aortic root is normal in size and structure.   Venous: The inferior vena cava is dilated in size with greater than 50%  respiratory variability, suggesting right atrial pressure of 8 mmHg.   IAS/Shunts: The interatrial septum appears to be lipomatous. No atrial  level shunt detected by color flow Doppler.     Risk Assessment/Calculations:     No BP recorded.  {Refresh Note OR Click here to enter BP  :1}***       Physical Exam:  VS:  There were no vitals taken for this visit.   Wt Readings from Last 3 Encounters:  11/01/22 231 lb 6.4 oz (105 kg)  06/19/20 237 lb (107.5 kg)  11/27/19 245 lb 9.6 oz (111.4 kg)    GEN: Obese, in no acute distress NECK: No JVD; No carotid bruits CARDIAC:  RRR, 1/6 systolic murmur LSB RESPIRATORY:  Clear to auscultation without rales, wheezing or rhonchi  ABDOMEN: Soft, non-tender, non-distended EXTREMITIES:  No edema; No deformity   ASSESSMENT AND PLAN: .   CAD:  Stent LAD 2006 Last Myoview  reviewed and normal in 2013  Decreasing physical activity. Will update Lexiscan myovue    HLD: labs with primary on statin, lopid and fish oil.  LDL 71 and  trig 152    Cardiomegaly: body habitus TTE 06/17/19 EF 60-65% normal LV size    HTN: BP high. Metoprolol filled at wrong dose. Will increase to 75 mg bid. 2 gm sodium diet. Bring readings in 2 weeks. No longer on hydrochlorothiazide  DM-A1C 8.3-doesn't want to take meds. Long discussion with her about treating DM. Will copy PCP.  Obesity: exercise and weight loss discussed. 150 min exercise weekly.   Lexiscan Myovue     Dispo: F/U in a year   Signed, Charlton Haws, MD

## 2023-05-06 ENCOUNTER — Emergency Department (HOSPITAL_COMMUNITY)
Admission: EM | Admit: 2023-05-06 | Discharge: 2023-05-06 | Disposition: A | Discharge status: Home / Self Care | Payer: Medicare Other | Attending: Emergency Medicine | Admitting: Emergency Medicine

## 2023-05-06 ENCOUNTER — Emergency Department (HOSPITAL_COMMUNITY): Payer: Medicare Other

## 2023-05-06 ENCOUNTER — Encounter (HOSPITAL_COMMUNITY): Payer: Self-pay

## 2023-05-06 ENCOUNTER — Other Ambulatory Visit: Payer: Self-pay

## 2023-05-06 DIAGNOSIS — T07XXXA Unspecified multiple injuries, initial encounter: Secondary | ICD-10-CM

## 2023-05-06 DIAGNOSIS — S80212A Abrasion, left knee, initial encounter: Secondary | ICD-10-CM | POA: Diagnosis not present

## 2023-05-06 DIAGNOSIS — S42201A Unspecified fracture of upper end of right humerus, initial encounter for closed fracture: Secondary | ICD-10-CM | POA: Insufficient documentation

## 2023-05-06 DIAGNOSIS — W19XXXA Unspecified fall, initial encounter: Secondary | ICD-10-CM | POA: Diagnosis not present

## 2023-05-06 DIAGNOSIS — Z7982 Long term (current) use of aspirin: Secondary | ICD-10-CM | POA: Insufficient documentation

## 2023-05-06 DIAGNOSIS — T148XXA Other injury of unspecified body region, initial encounter: Secondary | ICD-10-CM

## 2023-05-06 DIAGNOSIS — M19011 Primary osteoarthritis, right shoulder: Secondary | ICD-10-CM | POA: Diagnosis not present

## 2023-05-06 DIAGNOSIS — S80211A Abrasion, right knee, initial encounter: Secondary | ICD-10-CM | POA: Diagnosis not present

## 2023-05-06 DIAGNOSIS — M1712 Unilateral primary osteoarthritis, left knee: Secondary | ICD-10-CM | POA: Diagnosis not present

## 2023-05-06 DIAGNOSIS — S4991XA Unspecified injury of right shoulder and upper arm, initial encounter: Secondary | ICD-10-CM | POA: Diagnosis present

## 2023-05-06 DIAGNOSIS — M25521 Pain in right elbow: Secondary | ICD-10-CM | POA: Diagnosis not present

## 2023-05-06 DIAGNOSIS — S8992XA Unspecified injury of left lower leg, initial encounter: Secondary | ICD-10-CM | POA: Diagnosis not present

## 2023-05-06 DIAGNOSIS — S42351A Displaced comminuted fracture of shaft of humerus, right arm, initial encounter for closed fracture: Secondary | ICD-10-CM | POA: Diagnosis not present

## 2023-05-06 MED ORDER — OXYCODONE HCL 5 MG PO TABS: 2.5000 mg | ORAL_TABLET | Freq: Four times a day (QID) | ORAL | 0 refills | Status: DC | PRN

## 2023-05-06 MED ORDER — OXYCODONE HCL 5 MG PO TABS
5.0000 mg | ORAL_TABLET | Freq: Once | ORAL | Status: AC
  Administered 2023-05-06: 5 mg via ORAL
  Filled 2023-05-06: qty 1

## 2023-05-06 MED ORDER — HYDROMORPHONE HCL 1 MG/ML IJ SOLN: 1.0000 mg | Freq: Once | INTRAMUSCULAR | Status: DC

## 2023-05-06 MED ORDER — ONDANSETRON HCL 4 MG PO TABS: 4.0000 mg | ORAL_TABLET | Freq: Three times a day (TID) | ORAL | 0 refills | Status: AC | PRN

## 2023-05-06 MED ORDER — ONDANSETRON 4 MG PO TBDP
4.0000 mg | ORAL_TABLET | Freq: Once | ORAL | Status: AC
  Administered 2023-05-06: 4 mg via ORAL
  Filled 2023-05-06: qty 1

## 2023-05-06 MED ORDER — NAPROXEN 375 MG PO TABS: 375.0000 mg | ORAL_TABLET | Freq: Two times a day (BID) | ORAL | 0 refills | Status: AC

## 2023-05-06 NOTE — Discharge Instructions (Signed)
Contact a health care provider if: °You have any new pain, swelling, or bruising. °Your pain, swelling, and bruising do not improve. °Your cast, splint, or sling becomes loose or damaged. °Get help right away if: °Your skin or fingers on your injured arm turn blue or gray. °Your arm feels cold or numb. °You have severe pain in your injured arm. °

## 2023-05-06 NOTE — ED Triage Notes (Signed)
Pt arrives after fall today where she tripped over a curb landing in a puddle pain to right shoulder and leg. Denies hitting head, no blood thinners, no LOC.

## 2023-05-06 NOTE — ED Provider Notes (Incomplete)
[place] [facility] AT [facility] Provider Note   CSN: [id number] Arrival date & time: 05/06/23  1646     History {Add pertinent medical, surgical, social history, OB history to HPI:1} Chief Complaint  Patient presents with   Makailah Timberman is a 67 y.o. female who presents emergency department after mechanical fall.  Patient states that she lost her footing, fell on her knees and landed on her right shoulder.  She had severe pain in her right shoulder radiating into her elbow.  She denies hitting her head or losing consciousness.  She is up-to-date on her tetanus vaccination.   Fall       Home Medications Prior to Admission medications   Medication Sig Start Date End Date Taking? Authorizing Provider  Acetaminophen (8 HR ARTHRITIS PAIN RELIEF PO) Take 1 tablet by mouth 2 (two) times daily.    [provider]  Acetaminophen (TYLENOL PO) Take 650-1,000 mg by mouth daily as needed (pain).    [provider]  aspirin EC 81 MG tablet Take 81 mg by mouth daily.    [provider]  BIOTIN 5000 PO Take 5,000 mg by mouth daily.    [provider]  Cholecalciferol (VITAMIN D) 2000 UNITS CAPS Take 1 capsule by mouth daily.    [provider]  docusate sodium (COLACE) 100 MG capsule Take 100 mg by mouth daily as needed. constipation    [provider]  fish oil-omega-3 fatty acids 1000 MG capsule Take 1 g by mouth 2 (two) times daily.    [provider]  gemfibrozil (LOPID) 600 MG tablet Take 600 mg by mouth 2 (two) times daily before a meal.    [provider]  glucose blood test strip Use as instructed 09/11/19   Corum, Minerva Fester, MD  loratadine (CLARITIN) 10 MG tablet Take 10 mg by mouth daily.    [provider]  metoprolol tartrate (LOPRESSOR) 50 MG tablet Take 1.5 tablets (75 mg total) by mouth 2 (two) times daily. 11/01/22   Dyann Kief, PA-C  Multiple Vitamin (MULTIVITAMIN  WITH MINERALS) TABS tablet Take 0.5 tablets by mouth 2 (two) times daily.    [provider]  nitroGLYCERIN (NITROSTAT) 0.4 MG SL tablet Place 1 tablet (0.4 mg total) under the tongue every 5 (five) minutes as needed for chest pain. 05/30/19   Wendall Stade, MD  rosuvastatin (CRESTOR) 20 MG tablet Take 1 tablet (20 mg total) by mouth at bedtime. 07/29/19   Corum, Minerva Fester, MD  Sennosides-Docusate Sodium (SENNA-S PO) Take by mouth daily as needed.    [provider]  vitamin B-12 (CYANOCOBALAMIN) 500 MCG tablet Take 500 mcg by mouth daily.    [provider]      Allergies    Vicodin [hydrocodone-acetaminophen], Codeine, Doxycycline, Pentazocine lactate, Percocet [oxycodone-acetaminophen], and Duricef [cefadroxil monohydrate]    Review of Systems   Review of Systems  Physical Exam Updated Vital Signs BP (!) 161/88   Pulse 71   Temp 99 F (37.2 C) (Oral)   Resp 20   Ht 5\' 3"  (1.6 m)   Wt 99.8 kg   SpO2 100%   BMI 38.97 kg/m  Physical Exam Vitals and nursing note reviewed.  Constitutional:      General: She is not in acute distress.    Appearance: She is well-developed. She is not diaphoretic.  HENT:     Head: Normocephalic and atraumatic.  Right Ear: External ear normal.     Left Ear: External ear normal.     Nose: Nose normal.     Mouth/Throat:     Mouth: Mucous membranes are moist.  Eyes:     General: No scleral icterus.    Conjunctiva/sclera: Conjunctivae normal.  Cardiovascular:     Rate and Rhythm: Normal rate and regular rhythm.     Heart sounds: Normal heart sounds. No murmur heard.    No friction rub. No gallop.  Pulmonary:     Effort: Pulmonary effort is normal. No respiratory distress.     Breath sounds: Normal breath sounds.  Abdominal:     General: Bowel sounds are normal. There is no distension.     Palpations: Abdomen is soft. There is no mass.     Tenderness: There is no abdominal tenderness. There is no guarding.   Musculoskeletal:     Cervical back: Normal range of motion.     Comments: Exquisitely tender right shoulder.  No direct tenderness to palpation of the right elbow.  2+ radial pulse with normal capillary refill. Bilateral knee abrasions, swelling of the right knee, full range of motion  Skin:    General: Skin is warm and dry.  Neurological:     Mental Status: She is alert and oriented to person, place, and time.  Psychiatric:        Behavior: Behavior normal.     ED Results / Procedures / Treatments   Labs (all labs ordered are listed, but only abnormal results are displayed) Labs Reviewed - No data to display  EKG None  Radiology DG Elbow Complete Right Result Date: 05/06/2023 CLINICAL DATA:  Fall, pain. EXAM: RIGHT ELBOW - COMPLETE 3 VIEW COMPARISON:  None Available. FINDINGS: There is no evidence of fracture, dislocation, or joint effusion. There is no evidence of arthropathy or other focal bone abnormality. Soft tissues are unremarkable. IMPRESSION: Negative. Electronically Signed   By: Layla Maw M.D.   On: 05/06/2023 18:36   DG Shoulder Right Result Date: 05/06/2023 CLINICAL DATA:  Fall pain. EXAM: RIGHT SHOULDER - 3 VIEW COMPARISON:  None Available. FINDINGS: Comminuted fracture proximal humerus displaced by few mm. Acromioclavicular degenerative changes. Osseous structures are osteopenic. IMPRESSION: Proximal humerus fracture. Electronically Signed   By: Layla Maw M.D.   On: 05/06/2023 18:35    Procedures Procedures  {Document cardiac monitor, telemetry assessment procedure when appropriate:1}  Medications Ordered in ED Medications  ondansetron (ZOFRAN-ODT) disintegrating tablet 4 mg (4 mg Oral Given 05/06/23 1948)  oxyCODONE (Oxy IR/ROXICODONE) immediate release tablet 5 mg (5 mg Oral Given 05/06/23 1950)    ED Course/ Medical Decision Making/ A&P   {   Click here for ABCD2, HEART and other calculatorsREFRESH Note before signing :1}                               Medical Decision Making Amount and/or Complexity of Data Reviewed Radiology: ordered.  Risk Prescription drug management.   ***  {Document critical care time when appropriate:1} {Document review of labs and clinical decision tools ie heart score, Chads2Vasc2 etc:1}  {Document your independent review of radiology images, and any outside records:1} {Document your discussion with family members, caretakers, and with consultants:1} {Document social determinants of health affecting pt's care:1} {Document your decision making why or why not admission, treatments were needed:1} Final Clinical Impression(s) / ED Diagnoses Final diagnoses:  None    Rx /  DC Orders ED Discharge Orders     None

## 2023-05-07 ENCOUNTER — Telehealth (HOSPITAL_COMMUNITY): Payer: Self-pay | Admitting: Emergency Medicine

## 2023-05-07 MED ORDER — OXYCODONE HCL 5 MG PO TABS: 2.5000 mg | ORAL_TABLET | Freq: Four times a day (QID) | ORAL | 0 refills | Status: AC | PRN

## 2023-05-07 NOTE — ED Notes (Signed)
[name] [name] requested Oxy to be transferred to [facility] d/t [address] is closed today. EDP transferred medication. 05/07/2023. [id number] Son aware.

## 2023-05-07 NOTE — Telephone Encounter (Signed)
States they can not fill oxy rx today. Pharmacy updated and rx sent.

## 2023-05-08 ENCOUNTER — Ambulatory Visit: Payer: Medicare Other | Admitting: Cardiovascular Disease

## 2023-05-09 DIAGNOSIS — S4291XD Fracture of right shoulder girdle, part unspecified, subsequent encounter for fracture with routine healing: Secondary | ICD-10-CM | POA: Diagnosis not present

## 2023-05-09 DIAGNOSIS — Z7689 Persons encountering health services in other specified circumstances: Secondary | ICD-10-CM | POA: Diagnosis not present

## 2023-05-09 DIAGNOSIS — X58XXXD Exposure to other specified factors, subsequent encounter: Secondary | ICD-10-CM | POA: Diagnosis not present

## 2023-05-09 DIAGNOSIS — S4290XA Fracture of unspecified shoulder girdle, part unspecified, initial encounter for closed fracture: Secondary | ICD-10-CM | POA: Diagnosis not present

## 2023-05-17 NOTE — Progress Notes (Signed)
  Intake history:  BP (!) 147/81   Pulse 69   Ht 5' 3 (1.6 m)   Wt 220 lb (99.8 kg)   BMI 38.97 kg/m  Body mass index is 38.97 kg/m.    WHAT ARE WE SEEING YOU FOR TODAY?   right shoulder  How long has this bothered you? (DOI?DOS?WS?)  05/06/23   Anticoag.  No  Diabetes Yes  Heart disease Yes  Hypertension Yes  SMOKING HX No  Kidney disease No  Any ALLERGIES ______________________________________________   Treatment:  Have you taken:  Tylenol  yes      Advil No  Had PT No  Had injection No  Other  _________________________

## 2023-05-18 ENCOUNTER — Ambulatory Visit (INDEPENDENT_AMBULATORY_CARE_PROVIDER_SITE_OTHER): Payer: Medicare Other | Admitting: Orthopedic Surgery

## 2023-05-18 ENCOUNTER — Other Ambulatory Visit (INDEPENDENT_AMBULATORY_CARE_PROVIDER_SITE_OTHER): Payer: Medicare Other

## 2023-05-18 VITALS — BP 147/81 | HR 69 | Ht 63.0 in | Wt 220.0 lb

## 2023-05-18 DIAGNOSIS — S42294A Other nondisplaced fracture of upper end of right humerus, initial encounter for closed fracture: Secondary | ICD-10-CM

## 2023-05-18 NOTE — Progress Notes (Signed)
  Subjective:     Patient ID: [name] [name], female   DOB: 12-29-1955, 68 y.o.   MRN: [id number]  Chief Complaint  Patient presents with   Fracture    R humerus DOI 05/06/23    68 year old female history of left proximal humerus fracture fractured her right proximal humerus fracture near the time of Christmas while at the mall tripping over a curb  Comes in complaining right shoulder pain but only requiring 650 mg of Tylenol  1-2 times a day     Past Medical History:  Diagnosis Date   Anserine bursitis    Breast cancer (HCC)    left lumpectomy   CAD (coronary artery disease) 2006   DES to LAD   Cardiomegaly    Chest pain    Complication of anesthesia    Diabetes mellitus without complication (HCC)    Edema    legs   GERD (gastroesophageal reflux disease)    HTN (hypertension)    Hyperlipidemia    Kidney stones    Knee pain    MI (myocardial infarction) (HCC) 2005   PONV (postoperative nausea and vomiting)    Pulmonary embolism (HCC)      Review of Systems no numbness tingling or dysfunction related to the musculoskeletal system     Objective:   Physical Exam Vitals and nursing note reviewed.  Constitutional:      Appearance: Normal appearance.  HENT:     Head: Normocephalic and atraumatic.  Eyes:     General: No scleral icterus.       Right eye: No discharge.        Left eye: No discharge.     Extraocular Movements: Extraocular movements intact.     Conjunctiva/sclera: Conjunctivae normal.     Pupils: Pupils are equal, round, and reactive to light.  Cardiovascular:     Rate and Rhythm: Normal rate.     Pulses: Normal pulses.  Skin:    General: Skin is warm and dry.     Capillary Refill: Capillary refill takes less than 2 seconds.  Neurological:     General: No focal deficit present.     Mental Status: She is alert and oriented to person, place, and time.  Psychiatric:        Mood and Affect: Mood normal.        Behavior: Behavior normal.        Thought  Content: Thought content normal.        Judgment: Judgment normal.   Bruising and ecchymosis in the right distal arm normal function of the right hand good pulse normal perfusion no neurologic deficits       Assessment:     Interpretation of the outside image: Proximal humerus fracture minimal angulation minimal displacement  This x-ray was repeated to check alignment  DG Shoulder Right Result Date: 05/18/2023 Right humerus follow-up x-ray 7 to 10 days after initial injury to check alignment Essentially nondisplaced minimally angulated fracture right proximal humerus with good alignment of the glenohumeral joint       Plan:     Continue the sling wean during physical therapy which can be started  The patient has difficulty ambulation requiring a cane and cannot drive OT or physical therapy at home x-ray in 6 weeks

## 2023-05-18 NOTE — Addendum Note (Signed)
 Addended by: Baird Kay on: 05/18/2023 03:31 PM   Modules accepted: Orders

## 2023-05-18 NOTE — Addendum Note (Signed)
 Addended byCaffie Damme on: 05/18/2023 04:46 PM   Modules accepted: Orders

## 2023-05-24 ENCOUNTER — Telehealth: Payer: Self-pay | Admitting: Orthopedic Surgery

## 2023-05-24 NOTE — Telephone Encounter (Signed)
 Dr. Delfino Fellers pt - spoke w/[name] w/[facility] [phone], she stated that she was supposed to the pt on 05/26/23, but she got an opening for tomorrow, 05/25/23 so she will be seeing her then.  She needs a verbal order stating that is ok.

## 2023-05-24 NOTE — Telephone Encounter (Signed)
 I called [name] [name] and advised ok to see patient tomorrow.

## 2023-05-25 DIAGNOSIS — Z86711 Personal history of pulmonary embolism: Secondary | ICD-10-CM | POA: Diagnosis not present

## 2023-05-25 DIAGNOSIS — Z853 Personal history of malignant neoplasm of breast: Secondary | ICD-10-CM | POA: Diagnosis not present

## 2023-05-25 DIAGNOSIS — Z7982 Long term (current) use of aspirin: Secondary | ICD-10-CM | POA: Diagnosis not present

## 2023-05-25 DIAGNOSIS — I119 Hypertensive heart disease without heart failure: Secondary | ICD-10-CM | POA: Diagnosis not present

## 2023-05-25 DIAGNOSIS — E119 Type 2 diabetes mellitus without complications: Secondary | ICD-10-CM | POA: Diagnosis not present

## 2023-05-25 DIAGNOSIS — Z9181 History of falling: Secondary | ICD-10-CM | POA: Diagnosis not present

## 2023-05-25 DIAGNOSIS — Z955 Presence of coronary angioplasty implant and graft: Secondary | ICD-10-CM | POA: Diagnosis not present

## 2023-05-25 DIAGNOSIS — E785 Hyperlipidemia, unspecified: Secondary | ICD-10-CM | POA: Diagnosis not present

## 2023-05-25 DIAGNOSIS — Z87442 Personal history of urinary calculi: Secondary | ICD-10-CM | POA: Diagnosis not present

## 2023-05-25 DIAGNOSIS — I2510 Atherosclerotic heart disease of native coronary artery without angina pectoris: Secondary | ICD-10-CM | POA: Diagnosis not present

## 2023-05-25 DIAGNOSIS — I252 Old myocardial infarction: Secondary | ICD-10-CM | POA: Diagnosis not present

## 2023-05-25 DIAGNOSIS — K5900 Constipation, unspecified: Secondary | ICD-10-CM | POA: Diagnosis not present

## 2023-05-25 DIAGNOSIS — K219 Gastro-esophageal reflux disease without esophagitis: Secondary | ICD-10-CM | POA: Diagnosis not present

## 2023-05-25 DIAGNOSIS — S42294D Other nondisplaced fracture of upper end of right humerus, subsequent encounter for fracture with routine healing: Secondary | ICD-10-CM | POA: Diagnosis not present

## 2023-05-29 ENCOUNTER — Telehealth: Payer: Self-pay

## 2023-05-29 NOTE — Telephone Encounter (Signed)
Dr. Romeo Apple pt----[name] with [facility] left message on voicemail 05/26/23 at 1:51 pm stating that she needs a verbal order For physical therapy 1 x 3 weeks and said something about OT but really couldn't make out what was said.  Please call and advise  [phone]

## 2023-05-30 ENCOUNTER — Telehealth: Payer: Self-pay | Admitting: Orthopedic Surgery

## 2023-05-30 DIAGNOSIS — I252 Old myocardial infarction: Secondary | ICD-10-CM | POA: Diagnosis not present

## 2023-05-30 DIAGNOSIS — I2510 Atherosclerotic heart disease of native coronary artery without angina pectoris: Secondary | ICD-10-CM | POA: Diagnosis not present

## 2023-05-30 DIAGNOSIS — I119 Hypertensive heart disease without heart failure: Secondary | ICD-10-CM | POA: Diagnosis not present

## 2023-05-30 DIAGNOSIS — E119 Type 2 diabetes mellitus without complications: Secondary | ICD-10-CM | POA: Diagnosis not present

## 2023-05-30 DIAGNOSIS — K219 Gastro-esophageal reflux disease without esophagitis: Secondary | ICD-10-CM | POA: Diagnosis not present

## 2023-05-30 DIAGNOSIS — S42294D Other nondisplaced fracture of upper end of right humerus, subsequent encounter for fracture with routine healing: Secondary | ICD-10-CM | POA: Diagnosis not present

## 2023-05-30 NOTE — Telephone Encounter (Signed)
Dr. Mort Sawyers pt - spoke with [facility] w/[facility] [phone], she wants to know if there are any formal exercises for the right shoulder.

## 2023-05-31 NOTE — Telephone Encounter (Signed)
No, advance as tolerated, no restrictions I called Mallory to advise She voiced understanding

## 2023-06-02 DIAGNOSIS — E119 Type 2 diabetes mellitus without complications: Secondary | ICD-10-CM | POA: Diagnosis not present

## 2023-06-02 DIAGNOSIS — I252 Old myocardial infarction: Secondary | ICD-10-CM | POA: Diagnosis not present

## 2023-06-02 DIAGNOSIS — K219 Gastro-esophageal reflux disease without esophagitis: Secondary | ICD-10-CM | POA: Diagnosis not present

## 2023-06-02 DIAGNOSIS — S42294D Other nondisplaced fracture of upper end of right humerus, subsequent encounter for fracture with routine healing: Secondary | ICD-10-CM | POA: Diagnosis not present

## 2023-06-02 DIAGNOSIS — I119 Hypertensive heart disease without heart failure: Secondary | ICD-10-CM | POA: Diagnosis not present

## 2023-06-02 DIAGNOSIS — I2510 Atherosclerotic heart disease of native coronary artery without angina pectoris: Secondary | ICD-10-CM | POA: Diagnosis not present

## 2023-06-08 DIAGNOSIS — E119 Type 2 diabetes mellitus without complications: Secondary | ICD-10-CM | POA: Diagnosis not present

## 2023-06-08 DIAGNOSIS — I119 Hypertensive heart disease without heart failure: Secondary | ICD-10-CM | POA: Diagnosis not present

## 2023-06-08 DIAGNOSIS — K219 Gastro-esophageal reflux disease without esophagitis: Secondary | ICD-10-CM | POA: Diagnosis not present

## 2023-06-08 DIAGNOSIS — I2510 Atherosclerotic heart disease of native coronary artery without angina pectoris: Secondary | ICD-10-CM | POA: Diagnosis not present

## 2023-06-08 DIAGNOSIS — S42294D Other nondisplaced fracture of upper end of right humerus, subsequent encounter for fracture with routine healing: Secondary | ICD-10-CM | POA: Diagnosis not present

## 2023-06-08 DIAGNOSIS — I252 Old myocardial infarction: Secondary | ICD-10-CM | POA: Diagnosis not present

## 2023-06-09 DIAGNOSIS — I2510 Atherosclerotic heart disease of native coronary artery without angina pectoris: Secondary | ICD-10-CM | POA: Diagnosis not present

## 2023-06-09 DIAGNOSIS — E119 Type 2 diabetes mellitus without complications: Secondary | ICD-10-CM | POA: Diagnosis not present

## 2023-06-09 DIAGNOSIS — S42294D Other nondisplaced fracture of upper end of right humerus, subsequent encounter for fracture with routine healing: Secondary | ICD-10-CM | POA: Diagnosis not present

## 2023-06-09 DIAGNOSIS — K219 Gastro-esophageal reflux disease without esophagitis: Secondary | ICD-10-CM | POA: Diagnosis not present

## 2023-06-09 DIAGNOSIS — I252 Old myocardial infarction: Secondary | ICD-10-CM | POA: Diagnosis not present

## 2023-06-09 DIAGNOSIS — I119 Hypertensive heart disease without heart failure: Secondary | ICD-10-CM | POA: Diagnosis not present

## 2023-06-15 DIAGNOSIS — S42294D Other nondisplaced fracture of upper end of right humerus, subsequent encounter for fracture with routine healing: Secondary | ICD-10-CM | POA: Diagnosis not present

## 2023-06-15 DIAGNOSIS — I252 Old myocardial infarction: Secondary | ICD-10-CM | POA: Diagnosis not present

## 2023-06-15 DIAGNOSIS — I2510 Atherosclerotic heart disease of native coronary artery without angina pectoris: Secondary | ICD-10-CM | POA: Diagnosis not present

## 2023-06-15 DIAGNOSIS — K219 Gastro-esophageal reflux disease without esophagitis: Secondary | ICD-10-CM | POA: Diagnosis not present

## 2023-06-15 DIAGNOSIS — I119 Hypertensive heart disease without heart failure: Secondary | ICD-10-CM | POA: Diagnosis not present

## 2023-06-15 DIAGNOSIS — E119 Type 2 diabetes mellitus without complications: Secondary | ICD-10-CM | POA: Diagnosis not present

## 2023-06-16 DIAGNOSIS — I119 Hypertensive heart disease without heart failure: Secondary | ICD-10-CM | POA: Diagnosis not present

## 2023-06-16 DIAGNOSIS — I2510 Atherosclerotic heart disease of native coronary artery without angina pectoris: Secondary | ICD-10-CM | POA: Diagnosis not present

## 2023-06-16 DIAGNOSIS — E119 Type 2 diabetes mellitus without complications: Secondary | ICD-10-CM | POA: Diagnosis not present

## 2023-06-16 DIAGNOSIS — S42294D Other nondisplaced fracture of upper end of right humerus, subsequent encounter for fracture with routine healing: Secondary | ICD-10-CM | POA: Diagnosis not present

## 2023-06-16 DIAGNOSIS — K219 Gastro-esophageal reflux disease without esophagitis: Secondary | ICD-10-CM | POA: Diagnosis not present

## 2023-06-16 DIAGNOSIS — I252 Old myocardial infarction: Secondary | ICD-10-CM | POA: Diagnosis not present

## 2023-06-20 DIAGNOSIS — I2510 Atherosclerotic heart disease of native coronary artery without angina pectoris: Secondary | ICD-10-CM | POA: Diagnosis not present

## 2023-06-20 DIAGNOSIS — S42294D Other nondisplaced fracture of upper end of right humerus, subsequent encounter for fracture with routine healing: Secondary | ICD-10-CM | POA: Diagnosis not present

## 2023-06-20 DIAGNOSIS — I119 Hypertensive heart disease without heart failure: Secondary | ICD-10-CM | POA: Diagnosis not present

## 2023-06-20 DIAGNOSIS — I252 Old myocardial infarction: Secondary | ICD-10-CM | POA: Diagnosis not present

## 2023-06-20 DIAGNOSIS — E119 Type 2 diabetes mellitus without complications: Secondary | ICD-10-CM | POA: Diagnosis not present

## 2023-06-20 DIAGNOSIS — K219 Gastro-esophageal reflux disease without esophagitis: Secondary | ICD-10-CM | POA: Diagnosis not present

## 2023-06-24 DIAGNOSIS — I252 Old myocardial infarction: Secondary | ICD-10-CM | POA: Diagnosis not present

## 2023-06-24 DIAGNOSIS — I2510 Atherosclerotic heart disease of native coronary artery without angina pectoris: Secondary | ICD-10-CM | POA: Diagnosis not present

## 2023-06-24 DIAGNOSIS — Z87442 Personal history of urinary calculi: Secondary | ICD-10-CM | POA: Diagnosis not present

## 2023-06-24 DIAGNOSIS — K5900 Constipation, unspecified: Secondary | ICD-10-CM | POA: Diagnosis not present

## 2023-06-24 DIAGNOSIS — Z7982 Long term (current) use of aspirin: Secondary | ICD-10-CM | POA: Diagnosis not present

## 2023-06-24 DIAGNOSIS — E785 Hyperlipidemia, unspecified: Secondary | ICD-10-CM | POA: Diagnosis not present

## 2023-06-24 DIAGNOSIS — I119 Hypertensive heart disease without heart failure: Secondary | ICD-10-CM | POA: Diagnosis not present

## 2023-06-24 DIAGNOSIS — E119 Type 2 diabetes mellitus without complications: Secondary | ICD-10-CM | POA: Diagnosis not present

## 2023-06-24 DIAGNOSIS — K219 Gastro-esophageal reflux disease without esophagitis: Secondary | ICD-10-CM | POA: Diagnosis not present

## 2023-06-24 DIAGNOSIS — Z955 Presence of coronary angioplasty implant and graft: Secondary | ICD-10-CM | POA: Diagnosis not present

## 2023-06-24 DIAGNOSIS — Z853 Personal history of malignant neoplasm of breast: Secondary | ICD-10-CM | POA: Diagnosis not present

## 2023-06-24 DIAGNOSIS — S42294D Other nondisplaced fracture of upper end of right humerus, subsequent encounter for fracture with routine healing: Secondary | ICD-10-CM | POA: Diagnosis not present

## 2023-06-24 DIAGNOSIS — Z86711 Personal history of pulmonary embolism: Secondary | ICD-10-CM | POA: Diagnosis not present

## 2023-06-24 DIAGNOSIS — Z9181 History of falling: Secondary | ICD-10-CM | POA: Diagnosis not present

## 2023-06-29 ENCOUNTER — Encounter: Payer: BLUE CROSS/BLUE SHIELD | Admitting: Orthopedic Surgery

## 2023-06-29 DIAGNOSIS — I252 Old myocardial infarction: Secondary | ICD-10-CM | POA: Diagnosis not present

## 2023-06-29 DIAGNOSIS — I2510 Atherosclerotic heart disease of native coronary artery without angina pectoris: Secondary | ICD-10-CM | POA: Diagnosis not present

## 2023-06-29 DIAGNOSIS — K219 Gastro-esophageal reflux disease without esophagitis: Secondary | ICD-10-CM | POA: Diagnosis not present

## 2023-06-29 DIAGNOSIS — E119 Type 2 diabetes mellitus without complications: Secondary | ICD-10-CM | POA: Diagnosis not present

## 2023-06-29 DIAGNOSIS — S42294D Other nondisplaced fracture of upper end of right humerus, subsequent encounter for fracture with routine healing: Secondary | ICD-10-CM | POA: Diagnosis not present

## 2023-06-29 DIAGNOSIS — I119 Hypertensive heart disease without heart failure: Secondary | ICD-10-CM | POA: Diagnosis not present

## 2023-07-03 ENCOUNTER — Ambulatory Visit (INDEPENDENT_AMBULATORY_CARE_PROVIDER_SITE_OTHER): Payer: Medicare Other | Admitting: Orthopedic Surgery

## 2023-07-03 DIAGNOSIS — S42294D Other nondisplaced fracture of upper end of right humerus, subsequent encounter for fracture with routine healing: Secondary | ICD-10-CM | POA: Diagnosis not present

## 2023-07-03 DIAGNOSIS — I119 Hypertensive heart disease without heart failure: Secondary | ICD-10-CM | POA: Diagnosis not present

## 2023-07-03 DIAGNOSIS — K219 Gastro-esophageal reflux disease without esophagitis: Secondary | ICD-10-CM | POA: Diagnosis not present

## 2023-07-03 DIAGNOSIS — E119 Type 2 diabetes mellitus without complications: Secondary | ICD-10-CM | POA: Diagnosis not present

## 2023-07-03 DIAGNOSIS — I2510 Atherosclerotic heart disease of native coronary artery without angina pectoris: Secondary | ICD-10-CM | POA: Diagnosis not present

## 2023-07-03 DIAGNOSIS — I252 Old myocardial infarction: Secondary | ICD-10-CM | POA: Diagnosis not present

## 2023-07-03 NOTE — Progress Notes (Signed)
   There were no vitals taken for this visit.  There is no height or weight on file to calculate BMI.  Chief Complaint  Patient presents with   Routine Post Op    Follow up fracture to right shoulder   patient says she is doing great     Encounter Diagnosis  Name Primary?   Other closed nondisplaced fracture of proximal end of right humerus with routine healing, subsequent encounter 05/06/23 Yes    DOI/DOS/ Date: 05/06/2023  Improved

## 2023-07-03 NOTE — Progress Notes (Signed)
   There were no vitals taken for this visit.  There is no height or weight on file to calculate BMI.  Chief Complaint  Patient presents with   Routine Post Op    Follow up fracture to right shoulder   patient says she is doing great     Encounter Diagnosis  Name Primary?   Other closed nondisplaced fracture of proximal end of right humerus with routine healing, subsequent encounter 05/06/23 Yes    DOI/DOS/ Date: 05/06/2023  -3/26/ 25  90-day global period  Improved  [name] [name] has improved to physical therapy she has regained most of her ability to perform activities of daily living  She has 30 degrees of external rotation with the arm at her side she has active forward elevation of 120 degrees and abduction of 90 degrees with some scapular substitution  No pain in the shoulder  Continue home exercises for 2 months follow-up if she has any problems

## 2023-07-25 DIAGNOSIS — E1165 Type 2 diabetes mellitus with hyperglycemia: Secondary | ICD-10-CM | POA: Diagnosis not present

## 2023-07-25 DIAGNOSIS — I10 Essential (primary) hypertension: Secondary | ICD-10-CM | POA: Diagnosis not present

## 2023-08-02 DIAGNOSIS — E559 Vitamin D deficiency, unspecified: Secondary | ICD-10-CM | POA: Diagnosis not present

## 2023-08-02 DIAGNOSIS — S4290XD Fracture of unspecified shoulder girdle, part unspecified, subsequent encounter for fracture with routine healing: Secondary | ICD-10-CM | POA: Diagnosis not present

## 2023-08-02 DIAGNOSIS — E1165 Type 2 diabetes mellitus with hyperglycemia: Secondary | ICD-10-CM | POA: Diagnosis not present

## 2023-08-02 DIAGNOSIS — I10 Essential (primary) hypertension: Secondary | ICD-10-CM | POA: Diagnosis not present

## 2023-08-02 DIAGNOSIS — I2510 Atherosclerotic heart disease of native coronary artery without angina pectoris: Secondary | ICD-10-CM | POA: Diagnosis not present

## 2023-08-02 DIAGNOSIS — X58XXXD Exposure to other specified factors, subsequent encounter: Secondary | ICD-10-CM | POA: Diagnosis not present

## 2023-08-02 DIAGNOSIS — Z79899 Other long term (current) drug therapy: Secondary | ICD-10-CM | POA: Diagnosis not present

## 2023-08-02 DIAGNOSIS — R809 Proteinuria, unspecified: Secondary | ICD-10-CM | POA: Diagnosis not present

## 2023-08-02 DIAGNOSIS — E782 Mixed hyperlipidemia: Secondary | ICD-10-CM | POA: Diagnosis not present

## 2023-08-02 DIAGNOSIS — H04123 Dry eye syndrome of bilateral lacrimal glands: Secondary | ICD-10-CM | POA: Diagnosis not present

## 2023-08-02 DIAGNOSIS — M1990 Unspecified osteoarthritis, unspecified site: Secondary | ICD-10-CM | POA: Diagnosis not present

## 2023-08-02 DIAGNOSIS — E539 Vitamin B deficiency, unspecified: Secondary | ICD-10-CM | POA: Diagnosis not present

## 2023-11-04 ENCOUNTER — Other Ambulatory Visit: Payer: Self-pay | Admitting: Physician Assistant

## 2023-11-04 DIAGNOSIS — I10 Essential (primary) hypertension: Secondary | ICD-10-CM

## 2023-11-06 ENCOUNTER — Other Ambulatory Visit: Payer: Self-pay | Admitting: Physician Assistant

## 2023-11-06 DIAGNOSIS — I10 Essential (primary) hypertension: Secondary | ICD-10-CM

## 2023-11-17 ENCOUNTER — Encounter: Payer: Self-pay | Admitting: Cardiovascular Disease

## 2023-11-17 NOTE — Telephone Encounter (Signed)
 Error

## 2023-11-28 DIAGNOSIS — E539 Vitamin B deficiency, unspecified: Secondary | ICD-10-CM | POA: Diagnosis not present

## 2023-11-28 DIAGNOSIS — E782 Mixed hyperlipidemia: Secondary | ICD-10-CM | POA: Diagnosis not present

## 2023-11-28 DIAGNOSIS — E1165 Type 2 diabetes mellitus with hyperglycemia: Secondary | ICD-10-CM | POA: Diagnosis not present

## 2023-11-28 DIAGNOSIS — E559 Vitamin D deficiency, unspecified: Secondary | ICD-10-CM | POA: Diagnosis not present

## 2023-12-04 DIAGNOSIS — E559 Vitamin D deficiency, unspecified: Secondary | ICD-10-CM | POA: Diagnosis not present

## 2023-12-04 DIAGNOSIS — S4290XD Fracture of unspecified shoulder girdle, part unspecified, subsequent encounter for fracture with routine healing: Secondary | ICD-10-CM | POA: Diagnosis not present

## 2023-12-04 DIAGNOSIS — E782 Mixed hyperlipidemia: Secondary | ICD-10-CM | POA: Diagnosis not present

## 2023-12-04 DIAGNOSIS — E1165 Type 2 diabetes mellitus with hyperglycemia: Secondary | ICD-10-CM | POA: Diagnosis not present

## 2023-12-04 DIAGNOSIS — M1990 Unspecified osteoarthritis, unspecified site: Secondary | ICD-10-CM | POA: Diagnosis not present

## 2023-12-04 DIAGNOSIS — I2510 Atherosclerotic heart disease of native coronary artery without angina pectoris: Secondary | ICD-10-CM | POA: Diagnosis not present

## 2023-12-04 DIAGNOSIS — R809 Proteinuria, unspecified: Secondary | ICD-10-CM | POA: Diagnosis not present

## 2023-12-04 DIAGNOSIS — I10 Essential (primary) hypertension: Secondary | ICD-10-CM | POA: Diagnosis not present

## 2023-12-04 DIAGNOSIS — H04123 Dry eye syndrome of bilateral lacrimal glands: Secondary | ICD-10-CM | POA: Diagnosis not present

## 2023-12-04 DIAGNOSIS — E539 Vitamin B deficiency, unspecified: Secondary | ICD-10-CM | POA: Diagnosis not present

## 2023-12-19 ENCOUNTER — Other Ambulatory Visit: Payer: Self-pay | Admitting: Physician Assistant

## 2023-12-19 DIAGNOSIS — I10 Essential (primary) hypertension: Secondary | ICD-10-CM

## 2024-01-26 NOTE — Progress Notes (Signed)
 Cardiology Office Note:  .   Date:  02/09/2024  ID:  [name] [name], DOB October 22, 1955, MRN [id number] PCP: Shona Norleen PEDLAR, MD  [facility] Providers Cardiologist:  Maude Emmer, MD    History of Present Illness: .   [name] [name] is a 68 y.o. female with CAD stent LAD 2006, normal Myovue 2013, echo 2021 normal LVEF 60-65% no valve disease, breast CA, HTN, HLD.  Patient comes in after a 3 yr hiatus for me. No chest pain, dyspnea, dizziness, palpitations. Walks 20-30 min 2-3 times/week. Labs reviewed under [facility] A1C 8.3 trig 162, LDL 71. BP labile Getting extra salt with frozen dinners. Doesn't want to take meds for diabetes. Home BP readings satisfactory  After long discussion she is willing to try Jardiance since it is cardioprotective and lowers BS. She needs new nitroglycerin . N angina Does go to eye doctor and vision is fine  ROS:    Studies Reviewed: SABRA    EKG:  NSR with LVH and IRBBB-new.   Echo 06/2019 IMPRESSIONS     1. Left ventricular ejection fraction, by estimation, is 60 to 65%. The  left ventricle has normal function. Left ventricular diastolic parameters  are indeterminate.   2. Right ventricular systolic function is normal. There is normal  pulmonary artery systolic pressure. The estimated right ventricular  systolic pressure is 29.5 mmHg.   3. Left atrial size was upper normal.   4. The aortic valve is tricuspid. Miidly sclerotic aortic valve.   FINDINGS   Left Ventricle: Left ventricular ejection fraction, by estimation, is 60  to 65%. The left ventricle has normal function. The left ventricle has no  regional wall motion abnormalities. The left ventricular internal cavity  size was normal in size. There is   borderline left ventricular hypertrophy.   Right Ventricle: The right ventricular size is normal. No increase in  right ventricular wall thickness. Right ventricular systolic function is  normal. There is normal pulmonary artery systolic pressure. The  tricuspid  regurgitant velocity is 2.32 m/s, and   with an assumed right atrial pressure of 8 mmHg, the estimated right  ventricular systolic pressure is 29.5 mmHg.   Left Atrium: Left atrial size was upper normal.   Right Atrium: Right atrial size was normal in size.   Pericardium: There is no evidence of pericardial effusion. Presence of  pericardial fat pad.   Mitral Valve: The mitral valve is grossly normal. Trivial mitral valve  regurgitation.   Tricuspid Valve: The tricuspid valve is grossly normal. Tricuspid valve  regurgitation is mild.   Aortic Valve: The aortic valve is tricuspid. Aortic valve regurgitation is  not visualized. Mild aortic valve sclerosis is present, with no evidence  of aortic valve stenosis. Mild aortic valve annular calcification. There  is mild calcification of the  aortic valve.   Pulmonic Valve: The pulmonic valve was grossly normal. Pulmonic valve  regurgitation is not visualized.   Aorta: The aortic root is normal in size and structure.   Venous: The inferior vena cava is dilated in size with greater than 50%  respiratory variability, suggesting right atrial pressure of 8 mmHg.   IAS/Shunts: The interatrial septum appears to be lipomatous. No atrial  level shunt detected by color flow Doppler.          Physical Exam:   VS:  BP 130/70 (BP Location: Right Arm, Cuff Size: Large)   Pulse 70   Ht 5' 3 (1.6 m)   Wt 216 lb 9.6  oz (98.2 kg)   SpO2 97%   BMI 38.37 kg/m    Wt Readings from Last 3 Encounters:  02/09/24 216 lb 9.6 oz (98.2 kg)  05/18/23 220 lb (99.8 kg)  05/06/23 220 lb (99.8 kg)    Affect appropriate Healthy:  appears stated age HEENT: normal Neck supple with no adenopathy JVP normal no bruits no thyromegaly Lungs clear with no wheezing and good diaphragmatic motion Heart:  S1/S2 no murmur, no rub, gallop or click PMI normal Abdomen: benighn, BS positve, no tenderness, no AAA no bruit.  No HSM or HJR Distal pulses  intact with no bruits No edema Neuro non-focal Skin warm and dry No muscular weakness   ASSESSMENT AND PLAN: .   CAD:  Stent LAD 2006 Last Myoview  reviewed and normal in 2013  No angina continue medical Rx  New nitroglycerin  called in    HLD: labs with primary on statin, lopid  and fish oil.  LDL 71 and  trig 152    Cardiomegaly: body habitus TTE 06/17/19 EF 60-65% normal LV size    HTN: labile back on lopressor  75 mg bid home readings satisfactory   DM-A1C 8.3-doesn't want to take meds. Long discussion with her about treating DM. Will copy PCP. Start Jardiance 10 mg daily   Obesity: exercise and weight loss discussed. 150 min exercise weekly.         Dispo: F/U  in a year   Signed, Maude Emmer, MD

## 2024-02-09 ENCOUNTER — Ambulatory Visit: Attending: Cardiovascular Disease | Admitting: Cardiovascular Disease

## 2024-02-09 ENCOUNTER — Encounter: Payer: Self-pay | Admitting: Cardiovascular Disease

## 2024-02-09 ENCOUNTER — Other Ambulatory Visit: Payer: Self-pay | Admitting: Cardiovascular Disease

## 2024-02-09 VITALS — BP 130/70 | HR 70 | Ht 63.0 in | Wt 216.6 lb

## 2024-02-09 DIAGNOSIS — I10 Essential (primary) hypertension: Secondary | ICD-10-CM | POA: Insufficient documentation

## 2024-02-09 DIAGNOSIS — I2510 Atherosclerotic heart disease of native coronary artery without angina pectoris: Secondary | ICD-10-CM | POA: Insufficient documentation

## 2024-02-09 DIAGNOSIS — E1140 Type 2 diabetes mellitus with diabetic neuropathy, unspecified: Secondary | ICD-10-CM | POA: Insufficient documentation

## 2024-02-09 MED ORDER — EMPAGLIFLOZIN 10 MG PO TABS: 10.0000 mg | ORAL_TABLET | Freq: Every day | ORAL | 3 refills | Status: DC

## 2024-02-09 MED ORDER — NITROGLYCERIN 0.4 MG SL SUBL: 0.4000 mg | SUBLINGUAL_TABLET | SUBLINGUAL | 6 refills | Status: DC | PRN

## 2024-02-09 MED ORDER — NITROGLYCERIN 0.4 MG SL SUBL: 0.4000 mg | SUBLINGUAL_TABLET | SUBLINGUAL | 6 refills | Status: AC | PRN

## 2024-02-09 MED ORDER — EMPAGLIFLOZIN 10 MG PO TABS: 10.0000 mg | ORAL_TABLET | Freq: Every day | ORAL | 3 refills | Status: AC

## 2024-02-09 NOTE — Patient Instructions (Signed)
Medication Instructions:   START Jardiance 10 mg daily  Labwork: None today  Testing/Procedures: None today  Follow-Up: 1 year  Any Other Special Instructions Will Be Listed Below (If Applicable).  If you need a refill on your cardiac medications before your next appointment, please call your pharmacy.

## 2024-03-01 ENCOUNTER — Telehealth: Payer: Self-pay | Admitting: Cardiovascular Disease

## 2024-03-01 ENCOUNTER — Other Ambulatory Visit: Payer: Self-pay | Admitting: Cardiovascular Disease

## 2024-03-01 DIAGNOSIS — I10 Essential (primary) hypertension: Secondary | ICD-10-CM

## 2024-03-01 MED ORDER — METOPROLOL TARTRATE 50 MG PO TABS: 50.0000 mg | ORAL_TABLET | Freq: Two times a day (BID) | ORAL | 3 refills | Status: DC

## 2024-03-01 NOTE — Telephone Encounter (Signed)
*  STAT* If patient is at the pharmacy, call can be transferred to refill team.   1. Which medications need to be refilled? (please list name of each medication and dose if known) metoprolol  tartrate (LOPRESSOR ) 50 MG tablet    2. Would you like to learn more about the convenience, safety, & potential cost savings by using the [facility]?    3. Are you open to using the [facility] (Type [facility].  ).   4. Which pharmacy/location (including street and city if local pharmacy) is medication to be sent to? [facility] [phone] - [place], [place] - [address] AT [facility] OF [address]    5. Do they need a 30 day or 90 day supply? 90 day

## 2024-03-11 DIAGNOSIS — E1165 Type 2 diabetes mellitus with hyperglycemia: Secondary | ICD-10-CM | POA: Diagnosis not present

## 2024-03-11 DIAGNOSIS — E782 Mixed hyperlipidemia: Secondary | ICD-10-CM | POA: Diagnosis not present

## 2024-03-11 DIAGNOSIS — E539 Vitamin B deficiency, unspecified: Secondary | ICD-10-CM | POA: Diagnosis not present

## 2024-04-02 DIAGNOSIS — I10 Essential (primary) hypertension: Secondary | ICD-10-CM | POA: Diagnosis not present

## 2024-04-02 DIAGNOSIS — K5909 Other constipation: Secondary | ICD-10-CM | POA: Diagnosis not present

## 2024-04-02 DIAGNOSIS — K30 Functional dyspepsia: Secondary | ICD-10-CM | POA: Diagnosis not present

## 2024-05-08 ENCOUNTER — Telehealth: Payer: Self-pay | Admitting: Cardiovascular Disease

## 2024-05-08 ENCOUNTER — Other Ambulatory Visit (HOSPITAL_COMMUNITY): Payer: Self-pay

## 2024-05-08 MED ORDER — METOPROLOL TARTRATE 50 MG PO TABS
75.0000 mg | ORAL_TABLET | Freq: Two times a day (BID) | ORAL | 3 refills | Status: DC
  Filled 2024-05-08: qty 270, 90d supply, fill #0

## 2024-05-08 MED ORDER — METOPROLOL TARTRATE 50 MG PO TABS: 75.0000 mg | ORAL_TABLET | Freq: Two times a day (BID) | ORAL | 3 refills | Status: AC

## 2024-05-08 NOTE — Telephone Encounter (Signed)
 Lopressor  50 mg, take 1 1/2 tablets (75 mg total) twice a day, #270, RF:2 sent to [facility] on [address]

## 2024-05-08 NOTE — Telephone Encounter (Signed)
 Lvmtcb 05/08/24  Per chart review, saw Dr. Delford 02/2024 and he did note Metoprolol  tartrate 75 mg BID. This should be the correct dose. I believe RX was sent in incorrectly on 03/01/2024. Want to speak to pt and get recent BP info. As long as that is WNL, then would send in correct RX (would probably need to call pharmacy to d/c old one and verify receipt).

## 2024-05-08 NOTE — Telephone Encounter (Signed)
 Pt c/o medication issue:  1. Name of Medication:   metoprolol  tartrate (LOPRESSOR ) 50 MG tablet   2. How are you currently taking this medication (dosage and times per day)?   Patient stated she had been taking 1.5 tablets, twice daily in the morning and evening  3. Are you having a reaction (difficulty breathing--STAT)?   4. What is your medication issue?   Patient stated she had been taking 1.5 tablets, 2x daily and is now out of medication and did not realize her dosage had been changed.  Patient wants to confirm which dosage she should be taking and wants a prescription sent to [facility] [phone] - [place], [place] - [address] AT [facility] OF [address]
